# Patient Record
Sex: Female | Born: 1940 | Race: White | Hispanic: No | Smoking: Never smoker
Health system: Southern US, Community
[De-identification: ages and names within clinical notes are randomized; demographics above are authoritative.]

## PROBLEM LIST (undated history)

## (undated) DIAGNOSIS — C4490 Unspecified malignant neoplasm of skin, unspecified: Secondary | ICD-10-CM

## (undated) DIAGNOSIS — K635 Polyp of colon: Secondary | ICD-10-CM

## (undated) DIAGNOSIS — H269 Unspecified cataract: Secondary | ICD-10-CM

## (undated) DIAGNOSIS — Z9289 Personal history of other medical treatment: Secondary | ICD-10-CM

## (undated) DIAGNOSIS — C801 Malignant (primary) neoplasm, unspecified: Secondary | ICD-10-CM

## (undated) DIAGNOSIS — M1990 Unspecified osteoarthritis, unspecified site: Secondary | ICD-10-CM

## (undated) DIAGNOSIS — Z86010 Personal history of colonic polyps: Principal | ICD-10-CM

## (undated) DIAGNOSIS — E785 Hyperlipidemia, unspecified: Secondary | ICD-10-CM

## (undated) DIAGNOSIS — K5790 Diverticulosis of intestine, part unspecified, without perforation or abscess without bleeding: Secondary | ICD-10-CM

## (undated) DIAGNOSIS — K529 Noninfective gastroenteritis and colitis, unspecified: Secondary | ICD-10-CM

## (undated) HISTORY — DX: Polyp of colon: K63.5

## (undated) HISTORY — DX: Unspecified cataract: H26.9

## (undated) HISTORY — DX: Unspecified malignant neoplasm of skin, unspecified: C44.90

## (undated) HISTORY — DX: Personal history of colonic polyps: Z86.010

## (undated) HISTORY — DX: Hyperlipidemia, unspecified: E78.5

## (undated) HISTORY — PX: MOHS SURGERY: SUR867

## (undated) HISTORY — DX: Personal history of other medical treatment: Z92.89

## (undated) HISTORY — PX: TONSILLECTOMY: SUR1361

## (undated) HISTORY — DX: Unspecified osteoarthritis, unspecified site: M19.90

## (undated) HISTORY — DX: Diverticulosis of intestine, part unspecified, without perforation or abscess without bleeding: K57.90

## (undated) HISTORY — PX: COLONOSCOPY: SHX174

## (undated) HISTORY — DX: Noninfective gastroenteritis and colitis, unspecified: K52.9

---
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
## 1997-12-21 ENCOUNTER — Other Ambulatory Visit: Admission: RE | Admit: 1997-12-21 | Discharge: 1997-12-21 | Payer: Self-pay | Admitting: Obstetrics and Gynecology

## 1998-01-04 ENCOUNTER — Other Ambulatory Visit: Admission: RE | Admit: 1998-01-04 | Discharge: 1998-01-04 | Payer: Self-pay | Admitting: Obstetrics and Gynecology

## 1999-01-22 ENCOUNTER — Other Ambulatory Visit: Admission: RE | Admit: 1999-01-22 | Discharge: 1999-01-22 | Payer: Self-pay | Admitting: Obstetrics and Gynecology

## 2000-03-16 ENCOUNTER — Other Ambulatory Visit: Admission: RE | Admit: 2000-03-16 | Discharge: 2000-03-16 | Payer: Self-pay | Admitting: Obstetrics and Gynecology

## 2001-04-05 ENCOUNTER — Other Ambulatory Visit: Admission: RE | Admit: 2001-04-05 | Discharge: 2001-04-05 | Payer: Self-pay | Admitting: Obstetrics and Gynecology

## 2002-04-12 ENCOUNTER — Other Ambulatory Visit: Admission: RE | Admit: 2002-04-12 | Discharge: 2002-04-12 | Payer: Self-pay | Admitting: Obstetrics and Gynecology

## 2002-08-16 DIAGNOSIS — Z860100 Personal history of colon polyps, unspecified: Secondary | ICD-10-CM | POA: Insufficient documentation

## 2002-08-16 HISTORY — DX: Personal history of colonic polyps: Z86.010

## 2002-08-16 HISTORY — DX: Personal history of colon polyps, unspecified: Z86.0100

## 2003-07-17 ENCOUNTER — Other Ambulatory Visit: Admission: RE | Admit: 2003-07-17 | Discharge: 2003-07-17 | Payer: Self-pay | Admitting: Obstetrics and Gynecology

## 2004-07-25 ENCOUNTER — Other Ambulatory Visit: Admission: RE | Admit: 2004-07-25 | Discharge: 2004-07-25 | Payer: Self-pay | Admitting: *Deleted

## 2005-09-12 ENCOUNTER — Other Ambulatory Visit: Admission: RE | Admit: 2005-09-12 | Discharge: 2005-09-12 | Payer: Self-pay | Admitting: Obstetrics & Gynecology

## 2006-04-02 ENCOUNTER — Encounter: Admission: RE | Admit: 2006-04-02 | Discharge: 2006-04-02 | Payer: Self-pay | Admitting: Family Medicine

## 2006-10-23 ENCOUNTER — Other Ambulatory Visit: Admission: RE | Admit: 2006-10-23 | Discharge: 2006-10-23 | Payer: Self-pay | Admitting: Obstetrics & Gynecology

## 2007-06-07 HISTORY — PX: KNEE ARTHROSCOPY: SUR90

## 2007-08-20 IMAGING — CR DG CHEST 2V
2 series · 2 of 2 positions shown · non-contrast
Comparison: none

CLINICAL DATA: Low grade fever, cough. 
 CHEST X-RAY: 
 Two views of the chest show patchy opacity at the left lung base posteriorly and medially most consistent with pneumonia.  No effusion is seen.  The right lung is clear.  The heart is within normal limits in size.

[w chest pa]
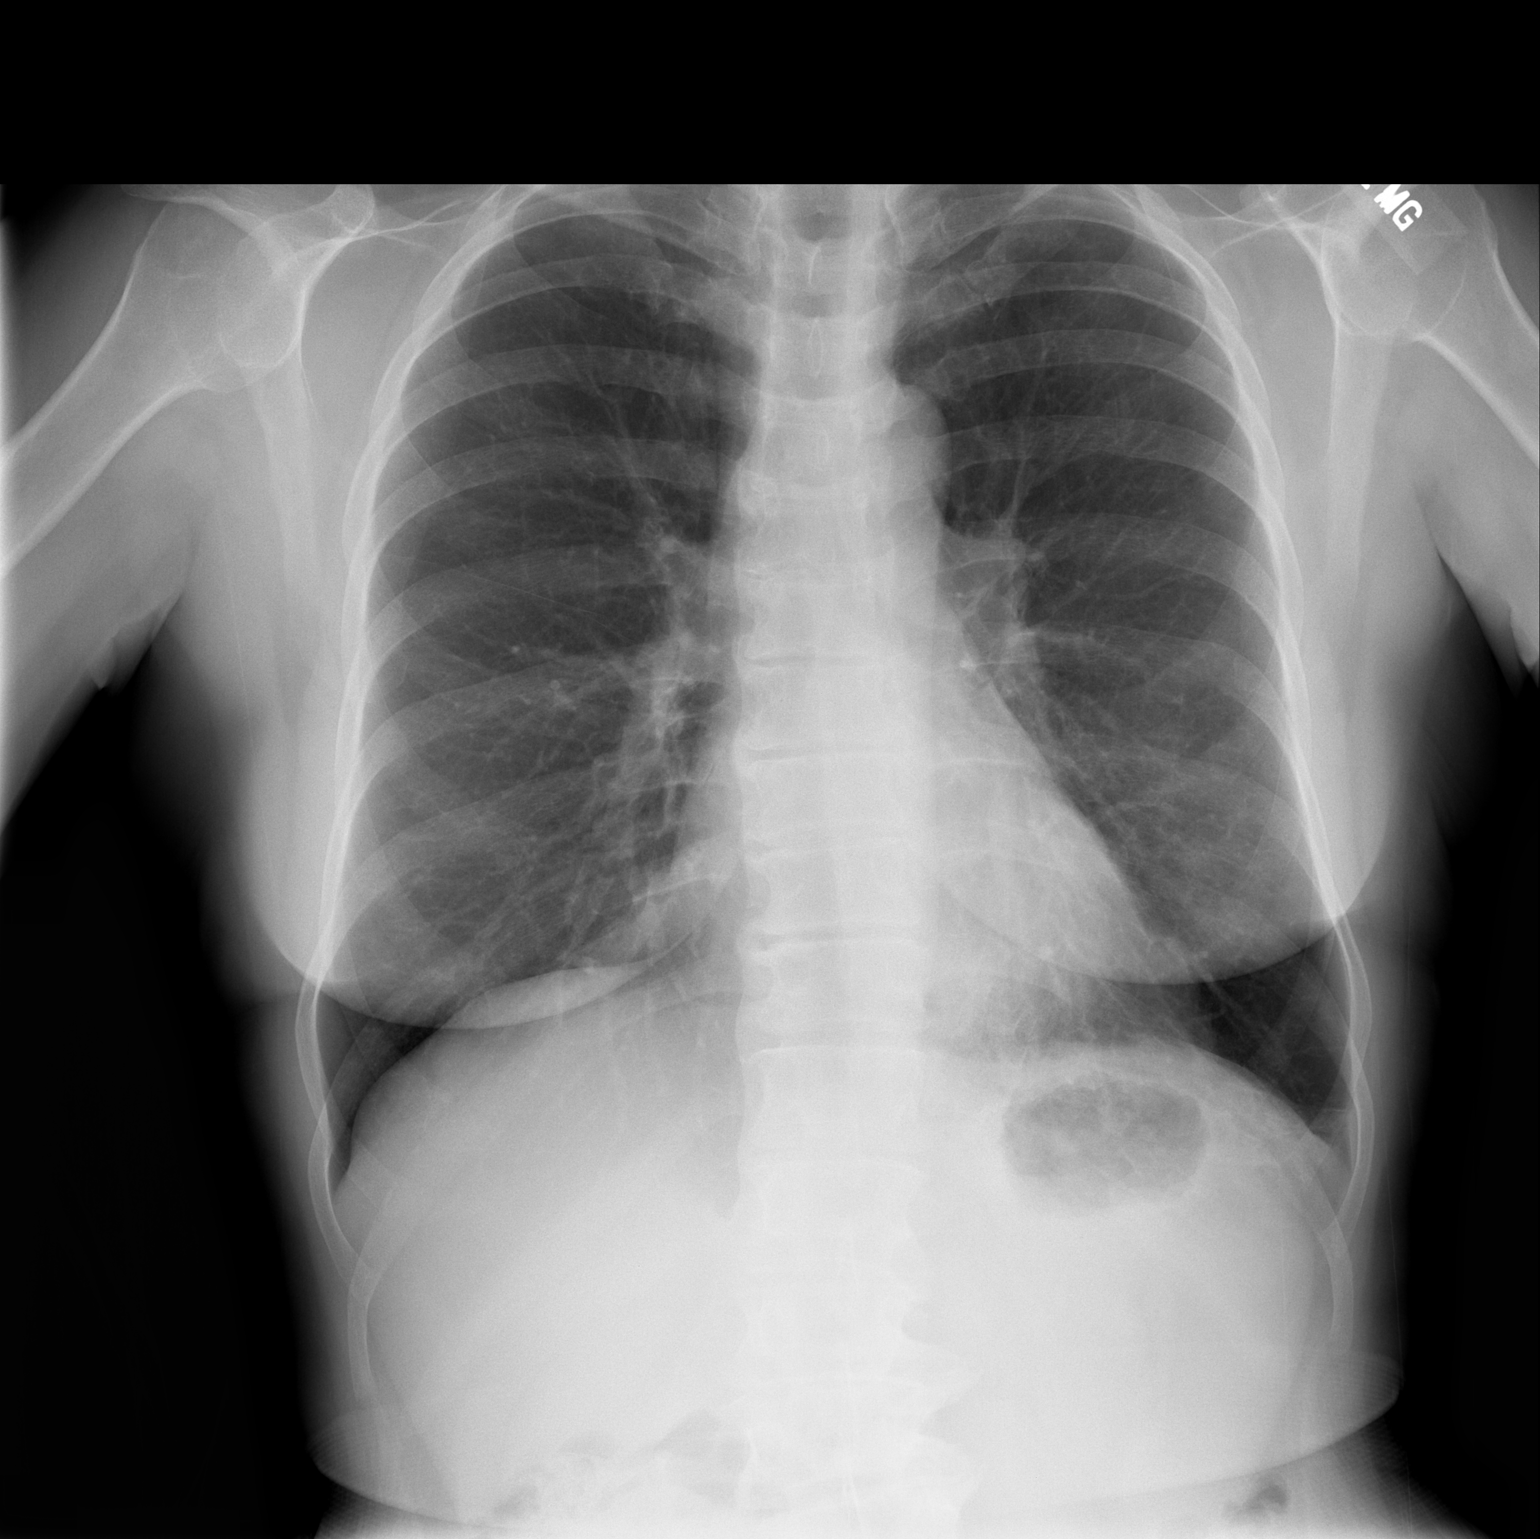

[w chest lat]
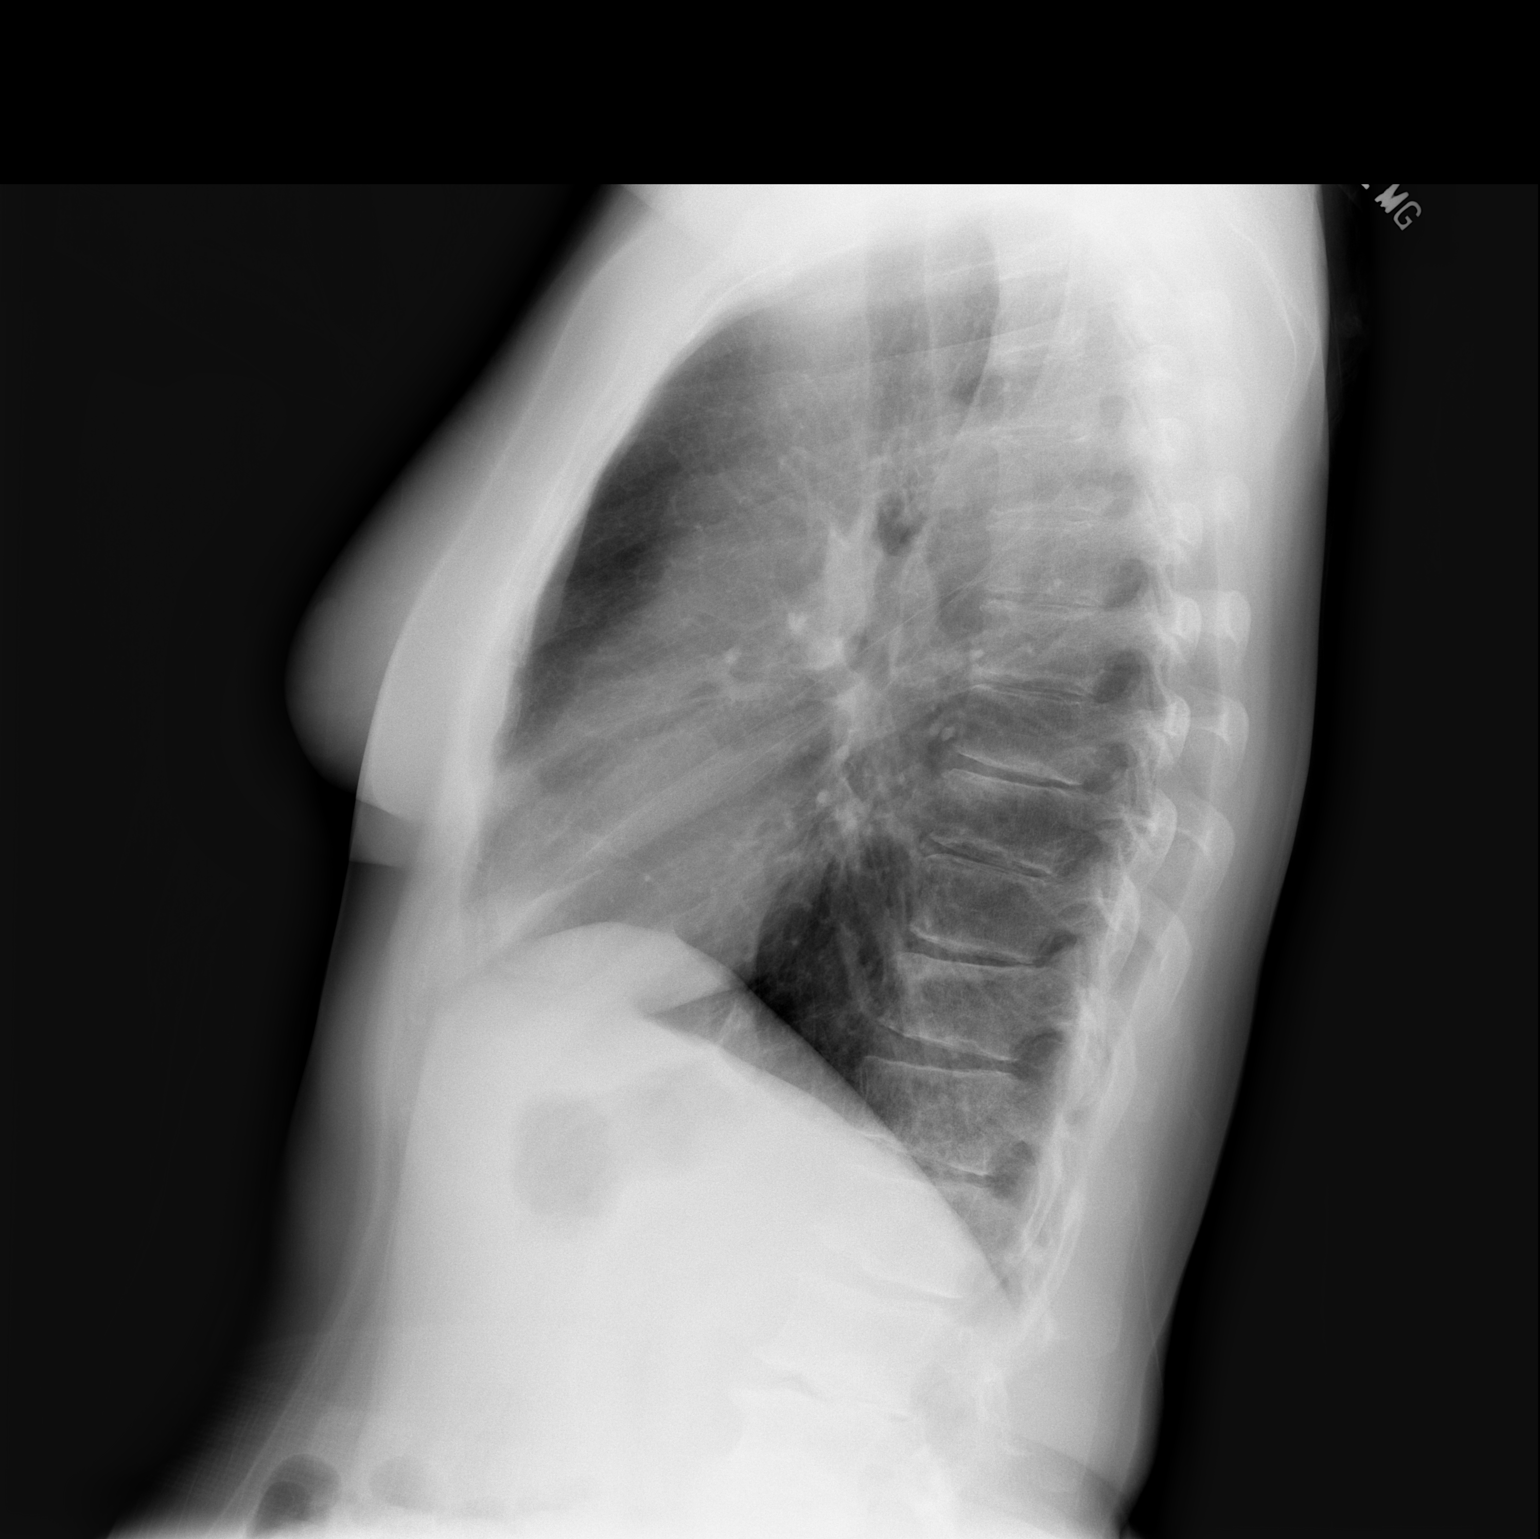

[2 of 2 positions shown; findings below may reference images not displayed]

IMPRESSION: Patchy left lower lobe pneumonia.

## 2007-10-28 ENCOUNTER — Other Ambulatory Visit: Admission: RE | Admit: 2007-10-28 | Discharge: 2007-10-28 | Payer: Self-pay | Admitting: Obstetrics & Gynecology

## 2008-12-05 HISTORY — PX: TOTAL KNEE ARTHROPLASTY: SHX125

## 2009-01-01 ENCOUNTER — Inpatient Hospital Stay (HOSPITAL_COMMUNITY): Admission: RE | Admit: 2009-01-01 | Discharge: 2009-01-03 | Payer: Self-pay | Admitting: Orthopedic Surgery

## 2009-02-13 ENCOUNTER — Encounter (INDEPENDENT_AMBULATORY_CARE_PROVIDER_SITE_OTHER): Payer: Self-pay | Admitting: *Deleted

## 2009-02-27 ENCOUNTER — Ambulatory Visit: Payer: Self-pay | Admitting: Internal Medicine

## 2009-03-14 ENCOUNTER — Telehealth: Payer: Self-pay | Admitting: Internal Medicine

## 2009-03-15 ENCOUNTER — Ambulatory Visit: Payer: Self-pay | Admitting: Internal Medicine

## 2009-03-15 ENCOUNTER — Encounter: Payer: Self-pay | Admitting: Internal Medicine

## 2009-03-18 ENCOUNTER — Encounter: Payer: Self-pay | Admitting: Internal Medicine

## 2010-05-15 IMAGING — CR DG CHEST 2V
2 series · 2 of 2 positions shown · non-contrast
Comparison: 04/02/2006.

CLINICAL DATA: 68-year-old female preoperative study.

CHEST - 2 VIEW

[view not recorded (1 of 2)]
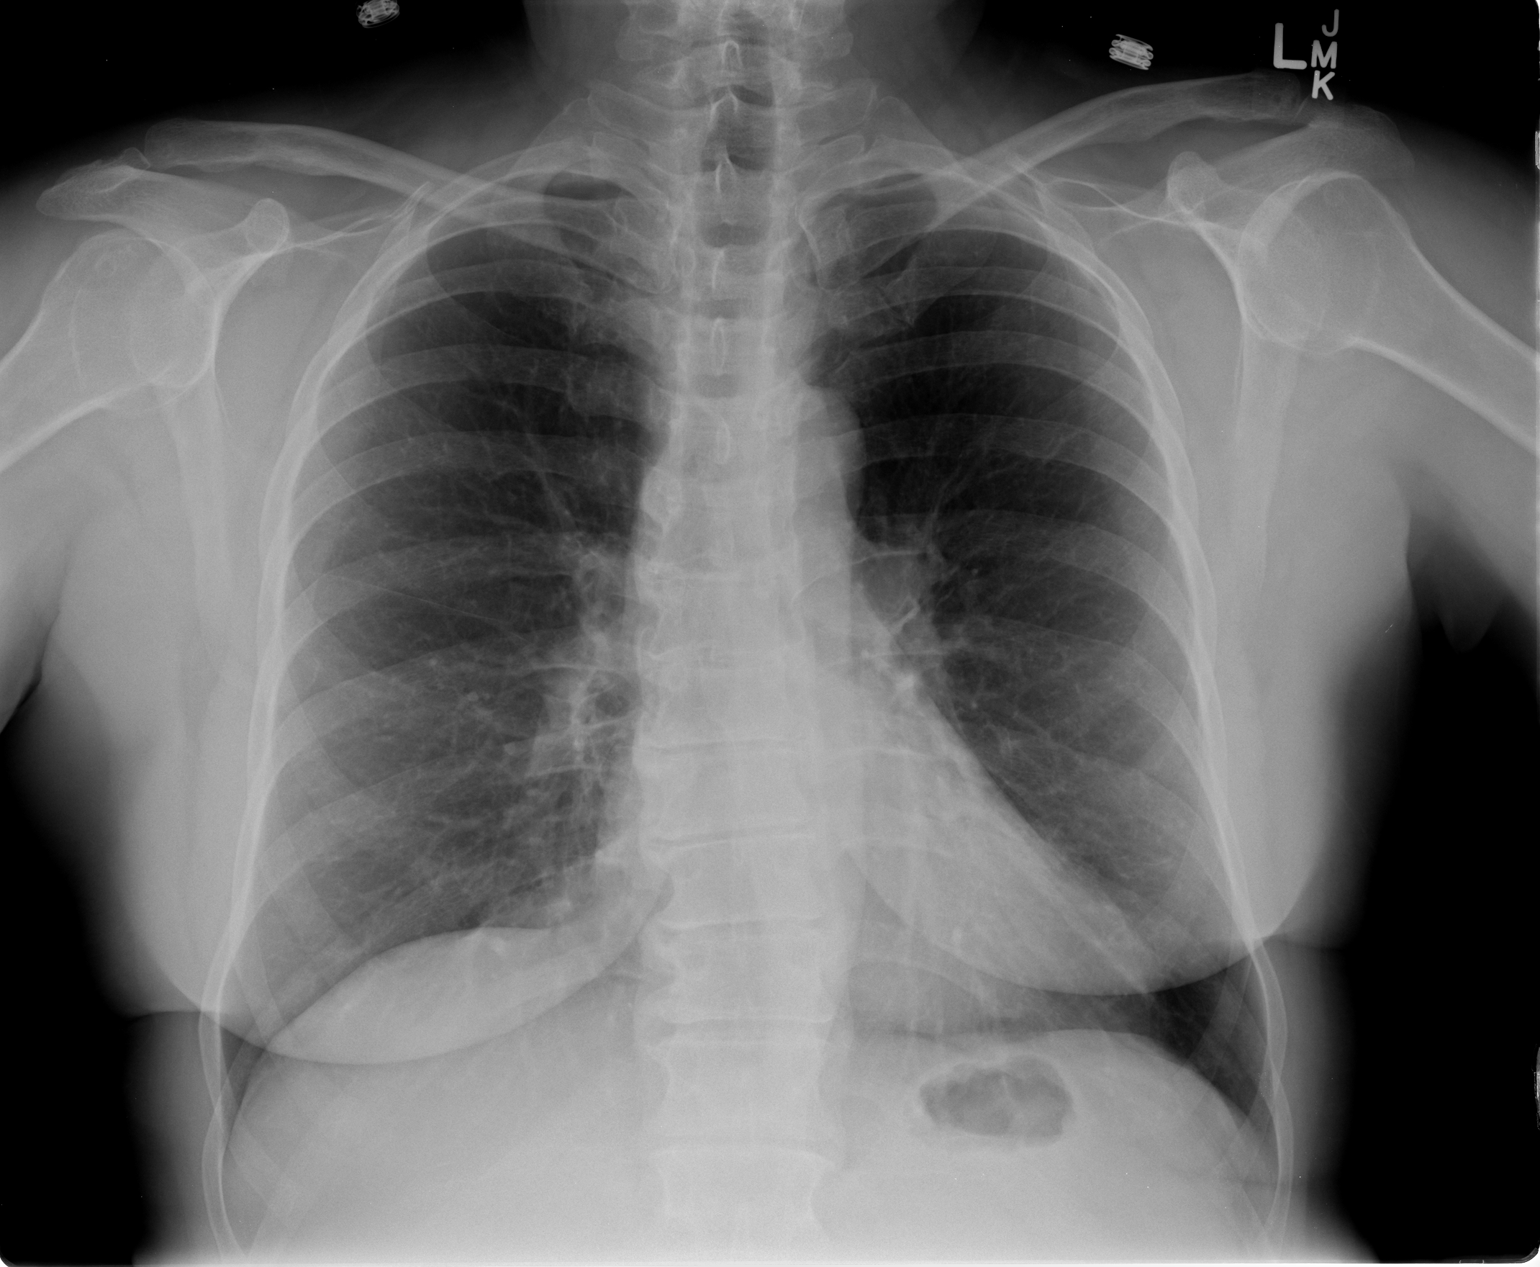

[view not recorded (2 of 2)]
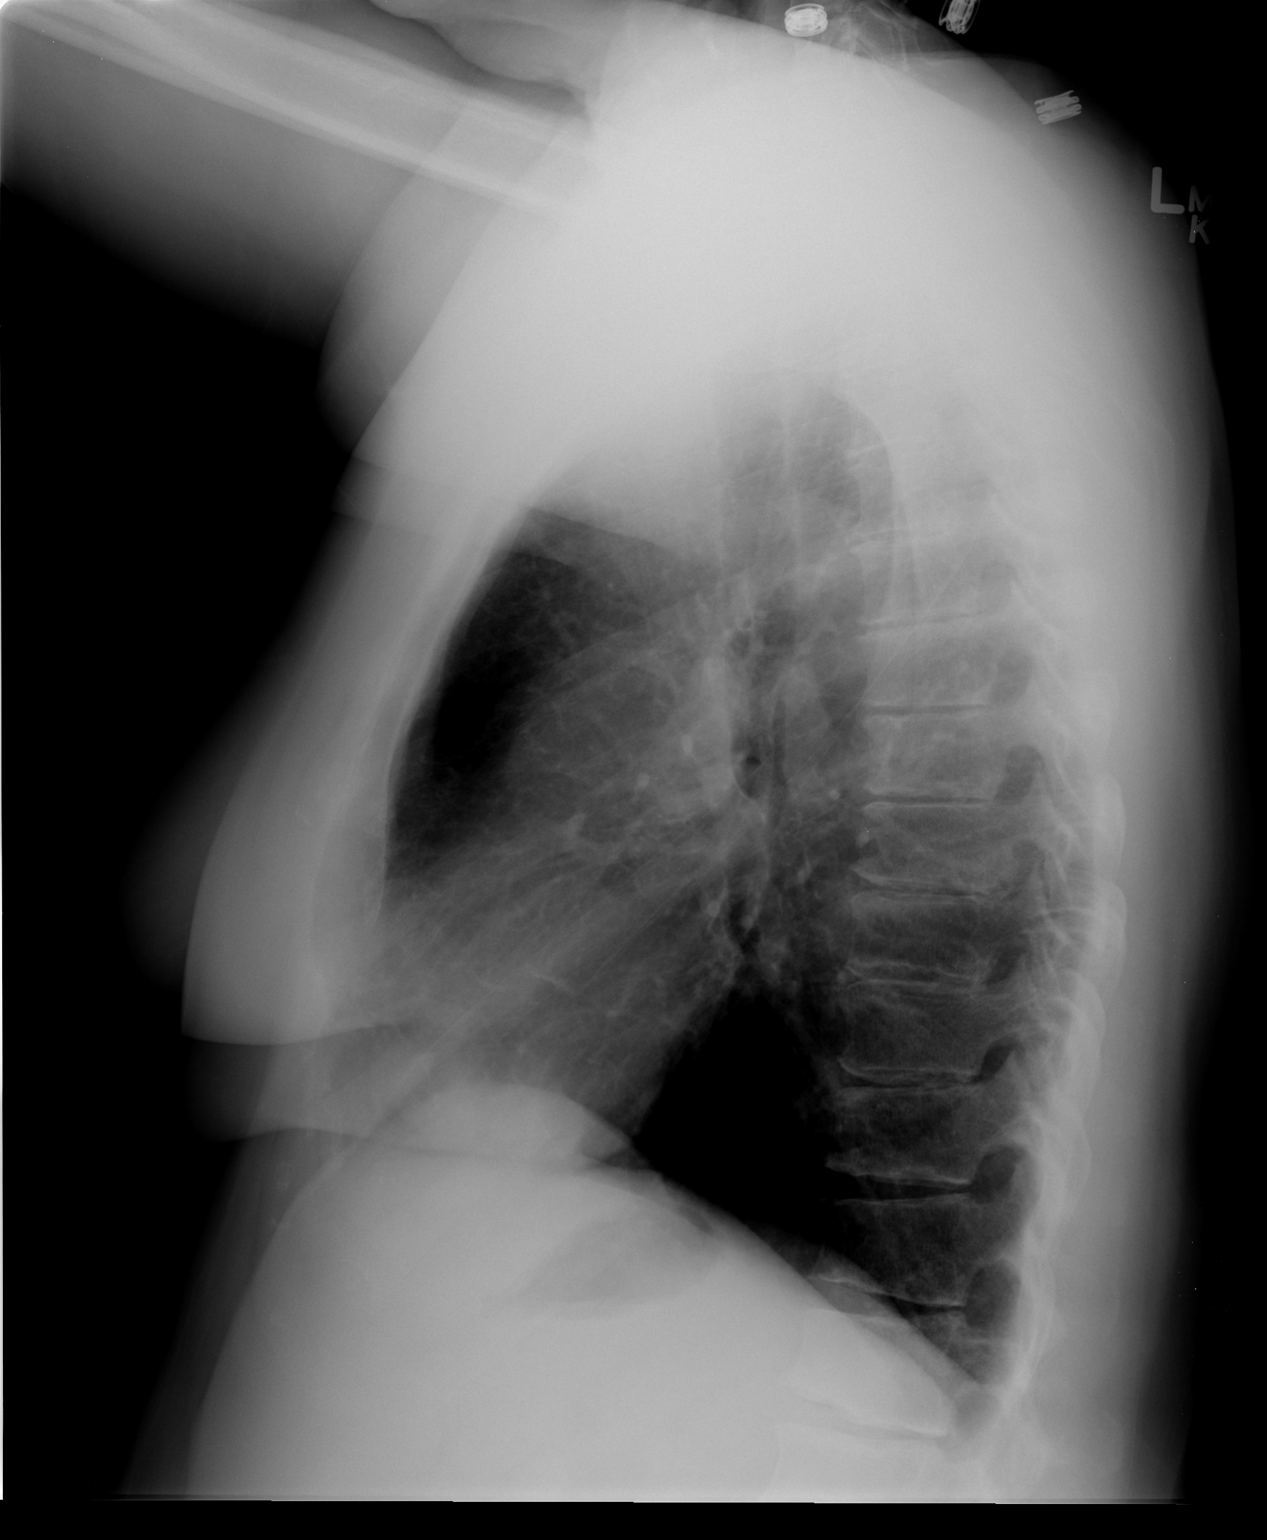

[2 of 2 positions shown; findings below may reference images not displayed]

FINDINGS: Cardiac size and mediastinal contours are within normal
limits.  Visualized tracheal air column is within normal limits.
Large lung volumes again noted.  No pneumothorax, pulmonary edema,
pleural effusion, consolidation, or confluent airspace opacity.
Stable visualized osseous structures.
IMPRESSION: No acute cardiopulmonary abnormality.

## 2010-10-14 LAB — COMPREHENSIVE METABOLIC PANEL
ALT: 38 U/L — ABNORMAL HIGH (ref 0–35)
AST: 32 U/L (ref 0–37)
Albumin: 4.2 g/dL (ref 3.5–5.2)
Calcium: 9.6 mg/dL (ref 8.4–10.5)
Chloride: 103 mEq/L (ref 96–112)
Creatinine, Ser: 0.93 mg/dL (ref 0.4–1.2)
Sodium: 141 mEq/L (ref 135–145)

## 2010-10-14 LAB — APTT: aPTT: 27 seconds (ref 24–37)

## 2010-10-14 LAB — PROTIME-INR
INR: 1 (ref 0.00–1.49)
INR: 1.1 (ref 0.00–1.49)
Prothrombin Time: 13.4 seconds (ref 11.6–15.2)
Prothrombin Time: 19.3 seconds — ABNORMAL HIGH (ref 11.6–15.2)

## 2010-10-14 LAB — URINALYSIS, ROUTINE W REFLEX MICROSCOPIC
Nitrite: NEGATIVE
Protein, ur: NEGATIVE mg/dL
Specific Gravity, Urine: 1.02 (ref 1.005–1.030)
Urobilinogen, UA: 0.2 mg/dL (ref 0.0–1.0)

## 2010-10-14 LAB — DIFFERENTIAL
Lymphs Abs: 2.3 10*3/uL (ref 0.7–4.0)
Monocytes Absolute: 0.5 10*3/uL (ref 0.1–1.0)
Monocytes Relative: 7 % (ref 3–12)
Neutro Abs: 5.1 10*3/uL (ref 1.7–7.7)
Neutrophils Relative %: 63 % (ref 43–77)

## 2010-10-14 LAB — URINE MICROSCOPIC-ADD ON

## 2010-10-14 LAB — CBC
HCT: 38.7 % (ref 36.0–46.0)
Hemoglobin: 10.6 g/dL — ABNORMAL LOW (ref 12.0–15.0)
MCHC: 33.9 g/dL (ref 30.0–36.0)
MCHC: 34.1 g/dL (ref 30.0–36.0)
MCV: 85.9 fL (ref 78.0–100.0)
Platelets: 157 10*3/uL (ref 150–400)
Platelets: 243 10*3/uL (ref 150–400)
RBC: 4.51 MIL/uL (ref 3.87–5.11)
RDW: 13.1 % (ref 11.5–15.5)
RDW: 13.2 % (ref 11.5–15.5)

## 2010-10-14 LAB — BASIC METABOLIC PANEL
BUN: 12 mg/dL (ref 6–23)
CO2: 30 mEq/L (ref 19–32)
Calcium: 9.3 mg/dL (ref 8.4–10.5)
Creatinine, Ser: 0.69 mg/dL (ref 0.4–1.2)
Creatinine, Ser: 0.77 mg/dL (ref 0.4–1.2)
GFR calc non Af Amer: >60 mL/min (ref >60)
Glucose, Bld: 116 mg/dL — ABNORMAL HIGH (ref 70–99)
Glucose, Bld: 134 mg/dL — ABNORMAL HIGH (ref 70–99)
Potassium: 4.5 mEq/L (ref 3.5–5.1)

## 2010-10-14 LAB — URINE CULTURE
Colony Count: NO GROWTH
Culture: NO GROWTH

## 2010-10-14 LAB — TYPE AND SCREEN
ABO/RH(D): A POS
Antibody Screen: NEGATIVE

## 2010-11-19 NOTE — Op Note (Signed)
Angelica Davis, Angelica Davis                ACCOUNT NO.:  [id number]   MEDICAL RECORD NO.:  [id number]          PATIENT TYPE:  INP   LOCATION:  NA                           FACILITY:  [facility]   PHYSICIAN:  Robert A. Thurston Hole, M.D. DATE OF BIRTH:  1940/07/21   DATE OF PROCEDURE:  01/01/2009  DATE OF DISCHARGE:                               OPERATIVE REPORT   PREOPERATIVE DIAGNOSIS:  Left knee degenerative joint disease.   POSTOPERATIVE DIAGNOSIS:  Left knee degenerative joint disease.   PROCEDURE:  Left total knee replacement using DePuy cemented total knee  system with #3 cemented femur, #3 cemented tibia with 12.5-mm  polyethylene RP tibial spacer, and 32-mm polyethylene cemented patella.   SURGEON:  Elana Alm. Thurston Hole, MD   ASSISTANT:  Julien Girt, PA   ANESTHESIA:  General.   OPERATIVE TIME:  1 hour and 25 minutes.   COMPLICATIONS:  None.   DESCRIPTION OF PROCEDURE:  [name] was brought to the operating room  on January 01, 2009, after a femoral nerve block was placed in holding by  Anesthesia.  She was placed on the operative table in supine position.  She received Ancef 2 g IV preoperatively for prophylaxis.  After being  placed under general anesthesia, she had a Foley catheter placed under  sterile conditions.  Her left knee was examined.  Range of motions from  -5 to 125 degrees, moderate varus deformity, knee stable, ligamentous  exam with normal patellar tracking.  Left leg was prepped using sterile  DuraPrep and draped using sterile technique.  The leg was exsanguinated  and a thigh tourniquet elevated to 375 mm.  Initially, through a 15-cm  longitudinal incision based over the patella, initial exposure was made.  The underlying subcutaneous tissues were incised along with skin  incision.  A median arthrotomy was performed revealing an excessive  amount of normal-appearing joint fluid.  The articular surfaces were  inspected.  She had grade 4 changes medially, grade 3  changes laterally,  and grade 3 and 4 changes in the patellofemoral joint.  Osteophytes  removed from the femoral condyles and tibial plateau.  The medial and  lateral meniscal remnants were removed as well as the anterior cruciate  ligament.  An intramedullary drill was then drilled up the femoral canal  for placement of distal femoral cutting jig, which was placed in the  appropriate manner by rotation and a distal 11-mm cut was made.  The  distal femur was then incised.  The #3 was found to be the appropriate  size.  The #3 cutting jig was placed in the appropriate manner by  external rotation and these cuts were made.  The proximal tibia was then  exposed.  The tibial spines were removed with an oscillating saw.  An  intramedullary drill was drilled down the tibial canal for placement of  proximal tibial cutting jig, which was placed in the appropriate manner  by rotation and a proximal 6-mm cut was made based off the medial or  lower side.  Spacer blocks were then placed in flexion and extension.  The 12.5-mm  blocks gave excellent balancing, excellent stability, and  excellent correction of her flexion and varus deformities.  The #3  tibial base plate trial was placed on the cut tibial surface with an  excellent fit and the keel cut was made.  The PCL box cutter was then  placed on the distal femur and these cuts were made.  At this point, the  #3 femoral trial was placed and with the #3 tibial base plate trial and  a 12.5-mm polyethylene RP tibial spacer, knee was reduced, taken through  a full range of motion from 0-125 degrees with excellent stability and  excellent correction of her flexion, varus deformities and normal  patellar tracking.  A resurfacing 9-mm cut was then made on the patella  and 3 locking holes placed for a 32-mm patella trial, which was placed.  Again, patellofemoral tracking was evaluated, and found to be normal.  At this point, it was felt that all the trial  components were of  excellent size, fit, and stability.  They were then removed.  The knee  was then jet lavaged, irrigated with 3 liters of saline.  The proximal  tibia was then exposed.  The #3 tibial base plate with cement backing  was hammered into position with an excellent fit with excess cement  being removed from around the edges.  The #3 femoral component with  cement backing was hammered into position also with an excellent fit  with excess cement being removed from around the edges.  The 12.5-mm  polyethylene RP tibial spacer was placed on the tibial base plate.  The  knee reduced, taken through a range of motion from 0-125 degrees with  excellent stability and excellent correction of her flexion and varus  deformities.  The 32-mm polyethylene cement backed patella was then  placed in its position and held there with a clamp.  After the cement  hardened, again patellofemoral tracking was evaluated and found to be  normal.  At this point, it was felt that all the components were of  excellent size, fit, and stability.  The knee was further irrigated with  saline.  The tourniquet was then released.  Hemostasis obtained with  cautery.  The arthrotomy was then closed with #1 Ethibond suture over 2-  medium Hemovac drains.  Subcutaneous tissues were closed with 0 and 2-0  Vicryl, subcuticular layer closed with 4-0 Monocryl.  Sterile dressings  and a long leg splint applied.  The patient then awakened, extubated,  and taken to recovery room in stable condition.  Needle and sponge  counts were correct x2 at the end of the case.  Neurovascular status  normal.  Pulses 2+ and symmetric.      Robert A. Thurston Hole, M.D.  Electronically Signed     RAW/MEDQ  D:  01/01/2009  T:  01/02/2009  Job:  [id number]

## 2010-11-19 NOTE — Discharge Summary (Signed)
NAMETOSHA, Angelica Davis                ACCOUNT NO.:  [id number]   MEDICAL RECORD NO.:  [id number]          PATIENT TYPE:  INP   LOCATION:  5041                         FACILITY:  [facility]   PHYSICIAN:  Elana Alm. Thurston Hole, M.D. DATE OF BIRTH:  [birth year]   DATE OF ADMISSION:  01/01/2009  DATE OF DISCHARGE:  01/03/2009                               DISCHARGE SUMMARY   ADMITTING DIAGNOSIS:  1. End-stage degenerative joint disease left knee.  2. High cholesterol.   DISCHARGE DIAGNOSIS:  1. End stage degenerative joint disease left knee status post total      knee replacement.  2. High cholesterol.  3. Postoperative  blood loss anemia.  4. Fever.   HISTORY OF PRESENT ILLNESS:  The patient is a 70 year old white female  with a history of end-stage DJD of her left knee.  She failed  conservative care including interarticular cortisone injections anti-  inflammatories and debriding arthroscopy.  Risks, benefits and possible  complications of the left total knee replacement were discussed with the  patient.  She understands these and is without question.   PROCEDURES IN-HOUSE:  On January 01, 2009 the patient underwent a left  total knee replacement by Dr. Thurston Hole, a left femoral nerve block by  anesthesia and Auto-vac transfusion.  She tolerated all of these  procedures well.   HOSPITAL COURSE:  She was admitted postoperatively for pain control, DVT  prophylaxis and physical therapy.  In the PACU she only tolerated the  CPM zero to 30 degrees due to pain.  As her pain improved, her CPM was  bumped up.  On postop day #1 she was metabolically stable with normal  renal function and her hemoglobin of 10.6.  Her INR was subtherapeutic  at 1.1.  She did well with physical therapy and was very compliant.  She  ambulated 40 feet minimal guard assist with the assistance of physical  therapist.  Over postop day two, she had a T-max of 100.9, hemoglobin  was 10.5.  White cell count was 8.3.  INR was 1.6.   Renal function was  within normal limits.  Blood pressure is 121/66, pulse was 108.  Surgical wound is well-approximated and healing with minimal drainage.  She tolerated  elevating her left heel on a folded pillow 30 minutes  each day.  She tolerated her CPM zero to 60 degrees today.  She will  need to increase it by 10 degrees a day until she is at 90 degrees and  then will need to do 6 hours of her CPM at zero to 90 degrees daily.  She will need to elevate her left heel on a folded pillow for 30 minutes  daily to work on extension.  She will need daily physical therapy for  ambulation, range of motion and strengthening.  She will need OT for  ADLs.   She can bathe on January 08, 2009, getting her knee wet.  Until then she  needs a clean dry dressing placed daily.  She is on a regular diet.  She  is to call our office with increased pain, increased  redness about the  wound, increased swelling, increased drainage or temperature greater  than 101.  She is encouraged incentive spirometry.  She will follow up  in our office on January 15, 2009 at 2:00 p.m. for x-rays and a wound  check.  Our office number is [phone].  She is being discharged to  skilled nursing in stable condition, weightbearing as tolerated on a  regular diet for physical therapy, occupational therapy and skilled  nursing to manage her Coumadin.   DISCHARGE MEDICATIONS:  1. Lovenox 30 mg subcu b.i.d. till INR is 2.0 or higher.  2. Coumadin 4 mg 1 tablet daily, adjust the dose accordingly to      maintain her INR between 2.0  and 3.0.  She will be on Coumadin      until January 29, 2009; at that time it can be discontinued.  3. She is on Colace 100 mg 1 tablet once a day.  4. Calcium carbonate 500 mg 1 tablet twice a day.  5. Vitamin D 3 at 2000 units 1 tablet daily and then 400 units 1      tablet daily.  6. Robaxin 500 mg 1 tablet p.o. q.6 h. p.r.n. muscle spasm.  7. Percocet 5/ 325 one to two q.4 h p.r.n. pain.  8. Zofran 4  mg one p.o. q.6 h. p.r.n. nausea.      Kirstin Shepperson, P.A.      Robert A. Thurston Hole, M.D.  Electronically Signed    KS/MEDQ  D:  01/03/2009  T:  01/03/2009  Job:  [id number]

## 2013-05-16 ENCOUNTER — Encounter: Payer: Self-pay | Admitting: Obstetrics & Gynecology

## 2013-05-16 ENCOUNTER — Ambulatory Visit (INDEPENDENT_AMBULATORY_CARE_PROVIDER_SITE_OTHER): Payer: Medicare Other | Admitting: Obstetrics & Gynecology

## 2013-05-16 VITALS — BP 158/84 | HR 68 | Resp 20 | Ht 62.75 in | Wt 212.0 lb

## 2013-05-16 DIAGNOSIS — E785 Hyperlipidemia, unspecified: Secondary | ICD-10-CM

## 2013-05-16 DIAGNOSIS — Z01419 Encounter for gynecological examination (general) (routine) without abnormal findings: Secondary | ICD-10-CM

## 2013-05-16 DIAGNOSIS — Z0000 Encounter for general adult medical examination without abnormal findings: Secondary | ICD-10-CM

## 2013-05-16 LAB — POCT URINALYSIS DIPSTICK
Blood, UA: NEGATIVE
Protein, UA: NEGATIVE
Urobilinogen, UA: NEGATIVE

## 2013-05-16 NOTE — Patient Instructions (Signed)

## 2013-05-16 NOTE — Progress Notes (Signed)
72 y.o. G3P3 DivorcedCaucasianF here for annual exam.  Doing well.  Father died in 12-02-2022.  Sister in law has bone cancer.  Having bone marrow transplant in 12-02-22.  Saw Dr. Manus Gunning in August.  Reports cholesterol was a little elevated.  She is back on some supplements.    Has mixed urinary incontinence.  Did see physical therapist.  Some of the exercises and dietary changes really helped.  Not interested in surgery right now.    No vaginal bleeding.    Patient's last menstrual period was 07/07/2004.          Sexually active: no  The current method of family planning is none.    Exercising: no  not regularly Smoker:  no  Health Maintenance: Pap:  05/13/12 WNL History of abnormal Pap:  no MMG:  08/19/11 normal, 05/18/12 left diag-normal-due  Colonoscopy:  9/10 repeat in 5 years, Dr. Leone Payor BMD:   7/12 (1.6/-0.7) TDaP:  6/10 Screening Labs: PCP, Hb today: PCP, Urine today: negative   reports that she has never smoked. She has never used smokeless tobacco. She reports that she does not drink alcohol or use illicit drugs.  Past Medical History  Diagnosis Date  . Colitis   . Colon polyp   . Diverticulosis   . Transfusion history     blood transfusion after childbirth    Past Surgical History  Procedure Laterality Date  . Knee surgery      left  . Total knee arthroplasty      left  . Colonoscopy      polyps removed    Current Outpatient Prescriptions  Medication Sig Dispense Refill  . Calcium Carb-Cholecalciferol (CALCIUM 1000 + D PO) Take 1,000 mg by mouth daily.      . Multiple Vitamins-Minerals (ICAPS) CAPS Take by mouth.      . NON FORMULARY phytomega   Daily for cholesterol       No current facility-administered medications for this visit.    Family History  Problem Relation Age of Onset  . Breast cancer Paternal Aunt   . Other Father     rectal polyps  . Colon cancer Mother   . COPD Father     ROS:  Pertinent items are noted in HPI.  Otherwise, a comprehensive ROS  was negative.  Exam:   BP 158/84  Pulse 68  Resp 20  Ht 5' 2.75" (1.594 m)  Wt 212 lb (96.163 kg)  BMI 37.85 kg/m2  LMP 07/07/2004  Weight change: -4lbs   Height: 5' 2.75" (159.4 cm)  Ht Readings from Last 3 Encounters:  05/16/13 5' 2.75" (1.594 m)    General appearance: alert, cooperative and appears stated age Head: Normocephalic, without obvious abnormality, atraumatic Neck: no adenopathy, supple, symmetrical, trachea midline and thyroid normal to inspection and palpation Lungs: clear to auscultation bilaterally Breasts: normal appearance, no masses or tenderness Heart: regular rate and rhythm Abdomen: soft, non-tender; bowel sounds normal; no masses,  no organomegaly Extremities: extremities normal, atraumatic, no cyanosis or edema Skin: Skin color, texture, turgor normal. No rashes or lesions Lymph nodes: Cervical, supraclavicular, and axillary nodes normal. No abnormal inguinal nodes palpated Neurologic: Grossly normal   Pelvic: External genitalia:  no lesions              Urethra:  normal appearing urethra with no masses, tenderness or lesions              Bartholins and Skenes: normal  Vagina: normal appearing vagina with normal color and discharge, no lesions              Cervix: no lesions              Pap taken: no Bimanual Exam:  Uterus:  normal size, contour, position, consistency, mobility, non-tender              Adnexa: normal adnexa and no mass, fullness, tenderness               Rectovaginal: Confirms               Anus:  normal sphincter tone, no lesions  A:  Well Woman with normal exam PMP, no HRT Elevated lipids, elevated LDLs Mixed urinary incontinence  P:   Mammogram yearly.  Pt knows due.  She will schedule. pap smear every other year per pt request. Labs/physical exam with Dr. Manus Gunning TSH today return annually or prn  An After Visit Summary was printed and given to the patient.

## 2013-08-11 ENCOUNTER — Ambulatory Visit: Payer: Self-pay | Admitting: Obstetrics & Gynecology

## 2014-03-06 ENCOUNTER — Encounter: Payer: Self-pay | Admitting: Internal Medicine

## 2014-04-28 ENCOUNTER — Ambulatory Visit (AMBULATORY_SURGERY_CENTER): Payer: Medicare Other | Admitting: *Deleted

## 2014-04-28 VITALS — Ht 63.0 in | Wt 222.4 lb

## 2014-04-28 DIAGNOSIS — Z86010 Personal history of colonic polyps: Secondary | ICD-10-CM

## 2014-04-28 NOTE — Progress Notes (Signed)
No allergies to eggs or soy. No problems with anesthesia.  Pt given Emmi instructions for colonoscopy  No oxygen use  No diet drug use  

## 2014-05-04 ENCOUNTER — Encounter: Payer: Self-pay | Admitting: Internal Medicine

## 2014-05-12 ENCOUNTER — Ambulatory Visit (AMBULATORY_SURGERY_CENTER): Payer: Medicare Other | Admitting: Internal Medicine

## 2014-05-12 ENCOUNTER — Encounter: Payer: Self-pay | Admitting: Internal Medicine

## 2014-05-12 VITALS — BP 129/77 | HR 71 | Temp 98.2°F | Resp 24 | Ht 63.0 in | Wt 222.0 lb

## 2014-05-12 DIAGNOSIS — D123 Benign neoplasm of transverse colon: Secondary | ICD-10-CM

## 2014-05-12 DIAGNOSIS — Z86010 Personal history of colonic polyps: Secondary | ICD-10-CM

## 2014-05-12 MED ORDER — SODIUM CHLORIDE 0.9 % IV SOLN: 500.0000 mL | INTRAVENOUS | Status: DC

## 2014-05-12 NOTE — Progress Notes (Signed)
Called to room to assist during endoscopic procedure.  Patient ID and intended procedure confirmed with present staff. Received instructions for my participation in the procedure from the performing physician.  

## 2014-05-12 NOTE — Op Note (Signed)
[place]  [address]. [place], [address]   COLONOSCOPY PROCEDURE REPORT  PATIENT: Angelica Davis, Angelica Davis  MR#: [id number] BIRTHDATE: [birth year] , 73  yrs. old GENDER: female ENDOSCOPIST: Gatha Mayer, MD, [facility] PROCEDURE DATE:  05/12/2014 PROCEDURE:   Colonoscopy with snare polypectomy First Screening Colonoscopy - Avg.  risk and is 50 yrs.  old or older - No.  Prior Negative Screening - Now for repeat screening. N/A  History of Adenoma - Now for follow-up colonoscopy & has been > or = to 3 yrs.  Yes hx of adenoma.  Has been 3 or more years since last colonoscopy.  Polyps Removed Today? Yes. ASA CLASS:   Class II INDICATIONS:surveillance colonoscopy based on a history of adenomatous colonic polyp(s). MEDICATIONS: Propofol 300 mg IV and Monitored anesthesia care  DESCRIPTION OF PROCEDURE:   After the risks benefits and alternatives of the procedure were thoroughly explained, informed consent was obtained.  The digital rectal exam revealed no abnormalities of the rectum.   The LB [id number]  endoscope was introduced through the anus and advanced to the cecum, which was identified by both the appendix and ileocecal valve. No adverse events experienced.   The quality of the prep was excellent, using MiraLax  The instrument was then slowly withdrawn as the colon was fully examined.   COLON FINDINGS: A sessile polyp measuring 4 mm in size was found in the transverse colon.  A polypectomy was performed with a cold snare.  The resection was complete, the polyp tissue was completely retrieved and sent to histology.   There was diverticulosis noted in the sigmoid colon.   The examination was otherwise normal. Retroflexed views right colon and rectum revealed no abnormalities. The time to cecum=3 minutes 05 seconds.  Withdrawal time=8 minutes 23 seconds.  The scope was withdrawn and the procedure completed. COMPLICATIONS: There were no immediate  complications.  ENDOSCOPIC IMPRESSION: 1.   Sessile polyp measuring 4 mm in size was found in the transverse colon; polypectomy was performed with a cold snare 2.   Diverticulosis was noted in the sigmoid colon 3.   The examination was otherwise normal in patient with prior adenomas 2005, 2010 and FHx CRCA elderly mom, polyps in dad  RECOMMENDATIONS: Repeat Colonoscopy in 5 years.  eSigned:  Gatha Mayer, MD, [facility] 05/12/2014 8:30 AM   cc: The Patient and Gaynelle Arabian, MD

## 2014-05-12 NOTE — Patient Instructions (Addendum)
I found and removed one small polyp. You still have diverticulosis. I will let you know pathology results and when to have another routine colonoscopy by mail. Suspect 2020.  I appreciate the opportunity to care for you. Gatha Mayer, MD, FACG  YOU HAD AN ENDOSCOPIC PROCEDURE TODAY AT [facility]: Refer to the procedure report that was given to you for any specific questions about what was found during the examination.  If the procedure report does not answer your questions, please call your gastroenterologist to clarify.  If you requested that your care partner not be given the details of your procedure findings, then the procedure report has been included in a sealed envelope for you to review at your convenience later.  YOU SHOULD EXPECT: Some feelings of bloating in the abdomen. Passage of more gas than usual.  Walking can help get rid of the air that was put into your GI tract during the procedure and reduce the bloating. If you had a lower endoscopy (such as a colonoscopy or flexible sigmoidoscopy) you may notice spotting of blood in your stool or on the toilet paper. If you underwent a bowel prep for your procedure, then you may not have a normal bowel movement for a few days.  DIET: Your first meal following the procedure should be a light meal and then it is ok to progress to your normal diet.  A half-sandwich or bowl of soup is an example of a good first meal.  Heavy or fried foods are harder to digest and may make you feel nauseous or bloated.  Likewise meals heavy in dairy and vegetables can cause extra gas to form and this can also increase the bloating.  Drink plenty of fluids but you should avoid alcoholic beverages for 24 hours.  ACTIVITY: Your care partner should take you home directly after the procedure.  You should plan to take it easy, moving slowly for the rest of the day.  You can resume normal activity the day after the procedure however you should  NOT DRIVE or use heavy machinery for 24 hours (because of the sedation medicines used during the test).    SYMPTOMS TO REPORT IMMEDIATELY: A gastroenterologist can be reached at any hour.  During normal business hours, 8:30 AM to 5:00 PM Monday through Friday, call [phone].  After hours and on weekends, please call the GI answering service at [phone] who will take a message and have the physician on call contact you.   Following lower endoscopy (colonoscopy or flexible sigmoidoscopy):  Excessive amounts of blood in the stool  Significant tenderness or worsening of abdominal pains  Swelling of the abdomen that is new, acute  Fever of 100F or higher  FOLLOW UP: If any biopsies were taken you will be contacted by phone or by letter within the next 1-3 weeks.  Call your gastroenterologist if you have not heard about the biopsies in 3 weeks.  Our staff will call the home number listed on your records the next business day following your procedure to check on you and address any questions or concerns that you may have at that time regarding the information given to you following your procedure. This is a courtesy call and so if there is no answer at the home number and we have not heard from you through the emergency physician on call, we will assume that you have returned to your regular daily activities without incident.  SIGNATURES/CONFIDENTIALITY: You and/or your  care partner have signed paperwork which will be entered into your electronic medical record.  These signatures attest to the fact that that the information above on your After Visit Summary has been reviewed and is understood.  Full responsibility of the confidentiality of this discharge information lies with you and/or your care-partner.  Recommendations Next colonoscopy in 5 years. Diverticulosis and polyp handout provided.

## 2014-05-12 NOTE — Progress Notes (Signed)
Procedure ends, to recovery, report given and VSS. 

## 2014-05-15 ENCOUNTER — Telehealth: Payer: Self-pay | Admitting: *Deleted

## 2014-05-15 NOTE — Telephone Encounter (Signed)
  Follow up Call-  Call back number 05/12/2014  Post procedure Call Back phone  # [phone]  Permission to leave phone message Yes     Patient questions:  Do you have a fever, pain , or abdominal swelling? No. Pain Score  0 *  Have you tolerated food without any problems? Yes.    Have you been able to return to your normal activities? Yes.    Do you have any questions about your discharge instructions: Diet   No. Medications  No. Follow up visit  No.  Do you have questions or concerns about your Care? No.  Actions: * If pain score is 4 or above: No action needed, pain <4.

## 2014-05-18 ENCOUNTER — Encounter: Payer: Self-pay | Admitting: Internal Medicine

## 2014-05-18 DIAGNOSIS — Z86010 Personal history of colonic polyps: Secondary | ICD-10-CM

## 2014-05-18 NOTE — Progress Notes (Signed)
Quick Note:  Diminutive adenoma  Repeat colon 2020 ______

## 2014-06-05 ENCOUNTER — Ambulatory Visit: Payer: Medicare Other | Admitting: Obstetrics & Gynecology

## 2014-06-26 ENCOUNTER — Other Ambulatory Visit: Payer: Self-pay | Admitting: Dermatology

## 2014-07-04 ENCOUNTER — Encounter: Payer: Self-pay | Admitting: Obstetrics & Gynecology

## 2014-07-05 ENCOUNTER — Ambulatory Visit (INDEPENDENT_AMBULATORY_CARE_PROVIDER_SITE_OTHER): Payer: Medicare Other | Admitting: Obstetrics & Gynecology

## 2014-07-05 ENCOUNTER — Encounter: Payer: Self-pay | Admitting: Obstetrics & Gynecology

## 2014-07-05 VITALS — BP 156/80 | HR 90 | Resp 18 | Ht 62.75 in | Wt 223.0 lb

## 2014-07-05 DIAGNOSIS — Z01419 Encounter for gynecological examination (general) (routine) without abnormal findings: Secondary | ICD-10-CM

## 2014-07-05 DIAGNOSIS — Z0000 Encounter for general adult medical examination without abnormal findings: Secondary | ICD-10-CM

## 2014-07-05 DIAGNOSIS — Z124 Encounter for screening for malignant neoplasm of cervix: Secondary | ICD-10-CM

## 2014-07-05 LAB — POCT URINALYSIS DIPSTICK
PH UA: 5
UROBILINOGEN UA: NEGATIVE

## 2014-07-05 NOTE — Progress Notes (Signed)
73 y.o. G3P3 DivorcedCaucasianF here for annual exam.  Doing well.  Colonoscopy was in November.  No vaginal bleeding.  Having some increased issues with swelling in left lower extremities.  Has gained some weight this past year.  Swelling will go down if she elevated her foot.  Non tender.  PCP:  Dr. Marisue Humble.  Had labs in the summer.  Cholesterol was a little elevated.    Patient's last menstrual period was 07/07/2004.          Sexually active: Yes.    The current method of family planning is post menopausal status.    Exercising: No.  The patient does not participate in regular exercise at present. Smoker:  no  Health Maintenance: Pap:  05/23/12 Negative  History of abnormal Pap:  no MMG:  01/11/14 Bi-Rads 1: Negative Colonoscopy:  05/12/14 1 polyp- f/u in 2020 BMD:   7/12 1.6/-0.7 TDaP:  12/2008 Screening Labs: yes, Hb today: Done by Gaynelle Cage, MD Urine today: Trace WBC's   reports that she has never smoked. She has never used smokeless tobacco. She reports that she does not drink alcohol or use illicit drugs.  Past Medical History  Diagnosis Date  . Colitis   . Colon polyp   . Diverticulosis   . Transfusion history     blood transfusion after childbirth  . Arthritis   . Hyperlipidemia   . Hx of colonic polyps - adenomas 08/16/2002  . History of blood transfusion     after birth of child    Past Surgical History  Procedure Laterality Date  . Knee arthroscopy Left 12/08    Dr. Noemi Chapel  . 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 Noemi Chapel  . Colonoscopy      polyps removed    Current Outpatient Prescriptions  Medication Sig Dispense Refill  . Calcium Carb-Cholecalciferol (CALCIUM 1000 + D PO) Take 1,000 mg by mouth daily.    . metroNIDAZOLE (METROGEL) 0.75 % gel Apply 1 application topically 2 (two) times daily.    . cephALEXin (KEFLEX) 500 MG capsule Take 500 mg by mouth 4 (four) times daily.    . Multiple Vitamins-Minerals (ICAPS) CAPS Take by mouth.    . NON  FORMULARY phytomega   Daily for cholesterol     No current facility-administered medications for this visit.    Family History  Problem Relation Age of Onset  . Breast cancer Paternal Aunt   . Other Father     rectal polyps  . COPD Father   . Colon cancer Mother 67    ROS:  Pertinent items are noted in HPI.  Otherwise, a comprehensive ROS was negative.  Exam:   BP 156/80 mmHg  Pulse 90  Resp 18  Ht 5' 2.75" (1.594 m)  Wt 223 lb (101.152 kg)  BMI 39.81 kg/m2  LMP 07/07/2004  Weight change: +9#  Height: 5' 2.75" (159.4 cm)  Ht Readings from Last 3 Encounters:  07/05/14 5' 2.75" (1.594 m)  05/12/14 5\' 3"  (1.6 m)  04/28/14 5\' 3"  (1.6 m)    General appearance: alert, cooperative and appears stated age Head: Normocephalic, without obvious abnormality, atraumatic Neck: no adenopathy, supple, symmetrical, trachea midline and thyroid normal to inspection and palpation Lungs: clear to auscultation bilaterally Breasts: normal appearance, no masses or tenderness Heart: regular rate and rhythm Abdomen: soft, non-tender; bowel sounds normal; no masses,  no organomegaly Extremities: extremities normal, atraumatic, no cyanosis or edema Skin: Skin color, texture, turgor normal. No rashes or  lesions Lymph nodes: Cervical, supraclavicular, and axillary nodes normal. No abnormal inguinal nodes palpated Neurologic: Grossly normal   Pelvic: External genitalia:  no lesions              Urethra:  normal appearing urethra with no masses, tenderness or lesions              Bartholins and Skenes: normal                 Vagina: normal appearing vagina with normal color and discharge, no lesions              Cervix: no lesions              Pap taken: Yes.   Bimanual Exam:  Uterus:  normal size, contour, position, consistency, mobility, non-tender              Adnexa: normal adnexa and no mass, fullness, tenderness               Rectovaginal: Confirms               Anus:  normal sphincter  tone, no lesions  Chaperone was present for exam.  A:  Well Woman with normal exam PMP, no HRT Elevated lipids, elevated LDLs being followed by Dr. Marisue Humble Mixed urinary incontinence Mild RLE edema  P: Mammogram yearly. pap smear every other year per pt request.  Pap today. Labs/physical exam with Dr. Marisue Humble D/W pt weight loss.  Pt states she is just not motivated enough for that right now. return annually or prn

## 2014-07-06 ENCOUNTER — Ambulatory Visit: Payer: Medicare Other | Admitting: Obstetrics & Gynecology

## 2014-07-10 LAB — IPS PAP SMEAR ONLY

## 2015-05-09 ENCOUNTER — Encounter: Payer: Self-pay | Admitting: Internal Medicine

## 2015-09-04 ENCOUNTER — Encounter: Payer: Self-pay | Admitting: Obstetrics & Gynecology

## 2015-09-04 ENCOUNTER — Ambulatory Visit (INDEPENDENT_AMBULATORY_CARE_PROVIDER_SITE_OTHER): Payer: Medicare Other | Admitting: Obstetrics & Gynecology

## 2015-09-04 VITALS — BP 142/80 | HR 74 | Resp 16 | Ht 62.5 in | Wt 188.0 lb

## 2015-09-04 DIAGNOSIS — Z01419 Encounter for gynecological examination (general) (routine) without abnormal findings: Secondary | ICD-10-CM | POA: Diagnosis not present

## 2015-09-04 DIAGNOSIS — E785 Hyperlipidemia, unspecified: Secondary | ICD-10-CM | POA: Insufficient documentation

## 2015-09-04 NOTE — Progress Notes (Addendum)
[name] here for annual exam.  Doing well.  Denies vaginal bleeding.    Actively doing weight watchers and has lost 35#'s.    PCP:  Angelica Davis.  Last appt was in October and did blood work then.  Reports cholesterol was 200.  Patient's last menstrual period was 07/07/2004.          Sexually active: No.  The current method of family planning is post menopausal status.    Exercising: No.  The patient does not participate in regular exercise at present. Smoker:  no  Health Maintenance: Pap:  07/05/14 Neg  History of abnormal Pap:  no MMG:  04/20/15 BIRADS1:neg Colonoscopy:  05/12/14 Polyps - repeat 5 years  BMD:   01/2011 Normal  TDaP:  12/2008 Pneumonia and shingles vaccines completed. Screening Labs: PCP, Urine today: PCP   reports that she has never smoked. She has never used smokeless tobacco. She reports that she does not drink alcohol or use illicit drugs.  Past Medical History  Diagnosis Date  . Colitis   . Colon polyp   . Diverticulosis   . Transfusion history     blood transfusion after childbirth  . Arthritis   . Hyperlipidemia   . Hx of colonic polyps - adenomas 08/16/2002  . History of blood transfusion     after birth of child    Past Surgical History  Procedure Laterality Date  . Knee arthroscopy Left 12/08    Dr. Noemi Davis  . 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 Noemi Davis  . Colonoscopy      polyps removed    Current Outpatient Prescriptions  Medication Sig Dispense Refill  . Calcium Carb-Cholecalciferol (CALCIUM 1000 + D PO) Take 1,000 mg by mouth daily.    . metroNIDAZOLE (METROGEL) 0.75 % gel Apply 1 application topically 2 (two) times daily. Reported on 09/04/2015    . Multiple Vitamins-Minerals (ICAPS) CAPS Take by mouth.     No current facility-administered medications for this visit.    Family History  Problem Relation Age of Onset  . Breast cancer Paternal Aunt   . Other Father     rectal polyps  . COPD Father   .  Colon cancer Mother 79    ROS:  Pertinent items are noted in HPI.  Otherwise, a comprehensive ROS was negative.  Exam:   BP 142/80 mmHg  Pulse 74  Resp 16  Ht 5' 2.5" (1.588 m)  Wt 188 lb (85.276 kg)  BMI 33.82 kg/m2  LMP 07/07/2004  Weight change: -35#   Height: 5' 2.5" (158.8 cm)  Ht Readings from Last 3 Encounters:  09/04/15 5' 2.5" (1.588 m)  07/05/14 5' 2.75" (1.594 m)  05/12/14 5\' 3"  (1.6 m)    General appearance: alert, cooperative and appears stated age Head: Normocephalic, without obvious abnormality, atraumatic Neck: no adenopathy, supple, symmetrical, trachea midline and thyroid normal to inspection and palpation Lungs: clear to auscultation bilaterally Breasts: normal appearance, no masses or tenderness Heart: regular rate and rhythm Abdomen: soft, non-tender; bowel sounds normal; no masses,  no organomegaly Extremities: extremities normal, atraumatic, no cyanosis or edema Skin: Skin color, texture, turgor normal. No rashes or lesions Lymph nodes: Cervical, supraclavicular, and axillary nodes normal. No abnormal inguinal nodes palpated Neurologic: Grossly normal   Pelvic: External genitalia:  no lesions              Urethra:  normal appearing urethra with no masses, tenderness or lesions  Bartholins and Skenes: normal                 Vagina: normal appearing vagina with normal color and discharge, no lesions              Cervix: no lesions              Pap taken: No. Bimanual Exam:  Uterus:  normal size, contour, position, consistency, mobility, non-tender              Adnexa: normal adnexa and no mass, fullness, tenderness               Rectovaginal: Confirms               Anus:  normal sphincter tone, no lesions  Chaperone was present for exam.  A:  Well Woman with normal exam PMP, no HRT H/O elevated lipids, much improved with weight less Mixed urinary incontinence Purposeful weight loss on Weight Loss  P: Mammogram yearly. pap  smear every other year per pt request. Pap normal 2015.  Labs/vaccine with Dr. Marisue Davis return annually or prn

## 2015-09-04 NOTE — Patient Instructions (Signed)
Plan bone density with your mammogram in Oct/Nov

## 2015-09-04 NOTE — Addendum Note (Signed)
Addended by: Megan Salon on: 09/04/2015 01:46 PM   Modules accepted: Miquel Dunn

## 2016-05-20 ENCOUNTER — Encounter: Payer: Self-pay | Admitting: Obstetrics & Gynecology

## 2016-08-14 ENCOUNTER — Telehealth: Payer: Self-pay | Admitting: Obstetrics & Gynecology

## 2016-08-14 NOTE — Telephone Encounter (Signed)
Spoke with patient. Patient states she received message. Patient states she called [facility] to schedule and no history of BMD on file at [facility]. Patient states she has never had BMD anywhere else. Advised patient will place call to [facility] and return call.   Spoke with Artist at [place]. Was advised last BMD on file May 04, 2007.   Return call to patient, advised last BMD 05/04/07, return call to [facility] for scheduling, order previously faxed. Patient verbalizes understanding and is agreeable.  Routing to provider for final review. Patient is agreeable to disposition. Will close encounter.

## 2016-08-14 NOTE — Telephone Encounter (Signed)
Patient called to schedule her BMD and was told that our office denied the order. Patient is asking why?

## 2016-08-14 NOTE — Telephone Encounter (Signed)
Last BMD 01/2011 -Normal. Order to Dr. Sabra Heck for signature.   BMD order faxed to [facility].   Call to patient, left detailed message, ok per current dpr. Advised order faxed to [facility] for BMD, call [facility] directly for scheduling BMD. Advised to return call to office at [phone] for any additional questions.  Routing to provider for final review. Patient is agreeable to disposition. Will close encounter.

## 2016-10-06 ENCOUNTER — Telehealth: Payer: Self-pay | Admitting: *Deleted

## 2016-10-06 NOTE — Telephone Encounter (Signed)
Message left to return call to Angelica Davis at [phone] to review BMD results.  

## 2016-10-08 NOTE — Telephone Encounter (Signed)
Patient returning call.

## 2016-10-09 NOTE — Telephone Encounter (Signed)
Patient advised of BMD results. Patient verbalized understanding and agreement.

## 2016-11-06 ENCOUNTER — Encounter: Payer: Self-pay | Admitting: Obstetrics & Gynecology

## 2016-11-07 ENCOUNTER — Encounter: Payer: Self-pay | Admitting: Obstetrics & Gynecology

## 2016-11-07 ENCOUNTER — Ambulatory Visit (INDEPENDENT_AMBULATORY_CARE_PROVIDER_SITE_OTHER): Payer: Medicare Other | Admitting: Obstetrics & Gynecology

## 2016-11-07 ENCOUNTER — Other Ambulatory Visit (HOSPITAL_COMMUNITY)
Admission: RE | Admit: 2016-11-07 | Discharge: 2016-11-07 | Disposition: A | Discharge status: Home / Self Care | Payer: Medicare Other | Source: Ambulatory Visit | Attending: Obstetrics & Gynecology | Admitting: Obstetrics & Gynecology

## 2016-11-07 VITALS — BP 128/72 | HR 76 | Resp 16 | Ht 62.5 in | Wt 210.0 lb

## 2016-11-07 DIAGNOSIS — Z124 Encounter for screening for malignant neoplasm of cervix: Secondary | ICD-10-CM

## 2016-11-07 DIAGNOSIS — Z01419 Encounter for gynecological examination (general) (routine) without abnormal findings: Secondary | ICD-10-CM

## 2016-11-07 DIAGNOSIS — R3915 Urgency of urination: Secondary | ICD-10-CM | POA: Diagnosis not present

## 2016-11-07 LAB — POCT URINALYSIS DIPSTICK
BILIRUBIN UA: NEGATIVE
GLUCOSE UA: NEGATIVE
Ketones, UA: NEGATIVE
Nitrite, UA: NEGATIVE
Protein, UA: NEGATIVE
RBC UA: NEGATIVE
Urobilinogen, UA: 0.2 E.U./dL
pH, UA: 5 (ref 5.0–8.0)

## 2016-11-07 MED ORDER — ESTRADIOL 0.1 MG/GM VA CREA: TOPICAL_CREAM | VAGINAL | 1 refills | Status: DC

## 2016-11-07 NOTE — Progress Notes (Signed)
76 y.o. G3P3 DivorcedCaucasianF here for annual exam.  Pt reports she has experienced some urinary urgency.  She feels like this has been present for several months.  No hematuria and no dysuria.  She isn't going to the bathroom more frequently.  Leaks with coughing or sneezing.  If has sensation of urgency and bladder is full, she does sometimes leak.  Did see pelvic PT and this helped.  Went on medication for cholesterol from [facility].  This is not a statin.  She is having more issues with numbness and tingling in her hands and fingers.  She wonders if these are related.  Feel it is not.  Offered referral to ortho.  She will consider.   Denies vaginal bleeding.   Patient's last menstrual period was 07/07/2004.          Sexually active: No.  The current method of family planning is post menopausal status.    Exercising: No.  The patient does not participate in regular exercise at present. Smoker:  no  Health Maintenance: Pap:  07/05/14 Neg  05/23/12 Neg   History of abnormal Pap:  no MMG:  05/05/16 BIRADS1:neg  Colonoscopy:  05/12/14 polyp. f/u 5 years  BMD:   09/19/16 Normal  TDaP:  12/2008  Pneumonia vaccine(s):  PCP Zostavax:   PCP Hep C testing: not indicated  Screening Labs: PCP, Urine today: patient requested due to urgency -- +1 WBC   reports that she has never smoked. She has never used smokeless tobacco. She reports that she does not drink alcohol or use drugs.  Past Medical History:  Diagnosis Date  . Arthritis   . Colitis   . Colon polyp   . Diverticulosis   . History of blood transfusion    after birth of child  . Hx of colonic polyps - adenomas 08/16/2002  . Hyperlipidemia   . Transfusion history    blood transfusion after childbirth    Past Surgical History:  Procedure Laterality Date  . COLONOSCOPY     polyps removed  . KNEE ARTHROSCOPY Left 12/08   Dr. Noemi Chapel  . TOTAL KNEE ARTHROPLASTY Left 6/10   Dr. Noemi Chapel    Current Outpatient Prescriptions   Medication Sig Dispense Refill  . Calcium Carb-Cholecalciferol (CALCIUM 1000 + D PO) Take 1,000 mg by mouth daily.    . metroNIDAZOLE (METROGEL) 0.75 % gel Apply 1 application topically 2 (two) times daily. Reported on 09/04/2015    . Multiple Vitamins-Minerals (ICAPS) CAPS Take by mouth.    . NON FORMULARY Take 2 capsules by mouth 2 (two) times daily. Phytomega capsule, take 2 capsules in the morning and 2 capsules at night     No current facility-administered medications for this visit.     Family History  Problem Relation Age of Onset  . Colon cancer Mother 21  . Breast cancer Paternal Aunt   . Other Father     rectal polyps  . COPD Father     ROS:  Pertinent items are noted in HPI.  Otherwise, a comprehensive ROS was negative.  Exam:   BP 128/72 (BP Location: Right Arm, Patient Position: Sitting, Cuff Size: Large)   Pulse 76   Resp 16   Ht 5' 2.5" (1.588 m)   Wt 210 lb (95.3 kg)   LMP 07/07/2004   BMI 37.80 kg/m   Weight change: +22#   Height: 5' 2.5" (158.8 cm)  Ht Readings from Last 3 Encounters:  11/07/16 5' 2.5" (1.588 m)  09/04/15 5'  2.5" (1.588 m)  07/05/14 5' 2.75" (1.594 m)    General appearance: alert, cooperative and appears stated age Head: Normocephalic, without obvious abnormality, atraumatic Neck: no adenopathy, supple, symmetrical, trachea midline and thyroid normal to inspection and palpation Lungs: clear to auscultation bilaterally Breasts: normal appearance, no masses or tenderness Heart: regular rate and rhythm Abdomen: soft, non-tender; bowel sounds normal; no masses,  no organomegaly Extremities: extremities normal, atraumatic, no cyanosis or edema Skin: Skin color, texture, turgor normal. No rashes or lesions Lymph nodes: Cervical, supraclavicular, and axillary nodes normal. No abnormal inguinal nodes palpated Neurologic: Grossly normal   Pelvic: External genitalia:  no lesions              Urethra:  normal appearing urethra with no masses,  tenderness or lesions              Bartholins and Skenes: normal                 Vagina: normal appearing vagina with normal color and discharge, no lesions, second degree cystocele              Cervix: no lesions              Pap taken: Yes.   Bimanual Exam:  Uterus:  normal size, contour, position, consistency, mobility, non-tender              Adnexa: normal adnexa and no mass, fullness, tenderness               Rectovaginal: Confirms               Anus:  Decreased anal sphincter tone, no lesions  Chaperone was present for exam.  A:  Well Woman with normal exam PMP, no HRT Mixed urinary incontinence Elevated lipids Second degree cystocele Urinary urgency that has worsened recently  P:   Mammogram guidelines reviewed pap smear obtained today Estrace vaginal cream, small amt externally two to three times weekly. Risks reviewed.  Pt declined trial of oral medication treatment for urinary urgency.   return annually or prn

## 2016-11-07 NOTE — Patient Instructions (Signed)
Shingrix.  The new shingles vaccine.  Two vaccinations.  The first one and then second one 2-6 months later.

## 2016-11-09 LAB — URINE CULTURE: Organism ID, Bacteria: NO GROWTH

## 2016-11-10 LAB — CYTOLOGY - PAP: Diagnosis: NEGATIVE

## 2016-12-19 ENCOUNTER — Ambulatory Visit: Payer: Self-pay | Admitting: Obstetrics & Gynecology

## 2016-12-22 ENCOUNTER — Telehealth: Payer: Self-pay | Admitting: Obstetrics & Gynecology

## 2016-12-22 NOTE — Telephone Encounter (Signed)
She can continue but probably doesn't need more than once weekly dosing to help maintain the improvement she's gotten.  Thanks.

## 2016-12-22 NOTE — Telephone Encounter (Signed)
Patient is calling to give information after taking her Estradiol prescription.

## 2016-12-22 NOTE — Telephone Encounter (Signed)
Spoke with patient. Patient states that she has been using Estrace vaginal cream 2 times a week since 11/07/2016. Reports she is no longer having urinary urgency. Feels much better. Asking if she can continue using the medication or if she should discontinue at this time as symptoms have resolved.

## 2016-12-23 NOTE — Telephone Encounter (Signed)
Spoke with patient. Advised of message as seen below from Dr.Miller. Patient is agreeable and verbalizes understanding.  Routing to provider for final review. Patient agreeable to disposition. Will close encounter   

## 2017-11-09 ENCOUNTER — Other Ambulatory Visit: Payer: Self-pay

## 2017-11-09 ENCOUNTER — Ambulatory Visit (INDEPENDENT_AMBULATORY_CARE_PROVIDER_SITE_OTHER): Payer: Medicare Other | Admitting: Obstetrics & Gynecology

## 2017-11-09 ENCOUNTER — Encounter: Payer: Self-pay | Admitting: Obstetrics & Gynecology

## 2017-11-09 VITALS — BP 138/70 | HR 88 | Resp 16 | Ht 62.5 in | Wt 218.0 lb

## 2017-11-09 DIAGNOSIS — Z01419 Encounter for gynecological examination (general) (routine) without abnormal findings: Secondary | ICD-10-CM

## 2017-11-09 DIAGNOSIS — R140 Abdominal distension (gaseous): Secondary | ICD-10-CM

## 2017-11-09 NOTE — Progress Notes (Signed)
77 y.o. G3P3 DivorcedCaucasianF here for annual exam. Doing well.  Denies vaginal bleeding.  Bladder issues are about the same.  Has used vaginal estrogen cream externally as needed when urinary urgency is improved.  Not using the estrogen cream right now.  Does leak some.  Wears a small pad every day.  Having some "issues" with legs.  Having leg pain/aches.  Has noticed some increased swelling in her feet this year.  Is symmetric. Not really having much today.  Also, reports issues with bloating and diarrhea after eating at [facility] where her mother lives.  Bloating is not every time she eats, no matter what she eats or where she eats.  Diarrhea is only after eating at [facility].  Denies rectal bleeding.     PCP:  Dr. Marisue Humble.  Last appt was in November.  Did blood work then.  Cholesterol was high.     Patient's last menstrual period was 07/07/2004.          Sexually active: No.  The current method of family planning is post menopausal status.    Exercising: Yes.    bike  Smoker:  no  Health Maintenance: Pap:  11/07/16 Neg   07/05/14 Neg  History of abnormal Pap:  no MMG: 07/31/17 BIRADS2:Benign  Colonoscopy:  05/12/14 polyps. F/u 5 years  BMD:   09/19/16 Normal  TDaP:  12/2008  Pneumonia vaccine(s):  done Shingrix:  No.  Pt interested in getting this.  Information given.  Hep C testing: n/a Screening Labs: PCP   reports that she has never smoked. She has never used smokeless tobacco. She reports that she does not drink alcohol or use drugs.  Past Medical History:  Diagnosis Date  . Arthritis   . Colitis   . Colon polyp   . Diverticulosis   . History of blood transfusion    after birth of child  . Hx of colonic polyps - adenomas 08/16/2002  . Hyperlipidemia   . Transfusion history    blood transfusion after childbirth    Past Surgical History:  Procedure Laterality Date  . COLONOSCOPY     polyps removed  . KNEE ARTHROSCOPY Left 12/08   Dr. Noemi Chapel  . TOTAL KNEE  ARTHROPLASTY Left 6/10   Dr. Noemi Chapel    Current Outpatient Medications  Medication Sig Dispense Refill  . Calcium Carb-Cholecalciferol (CALCIUM 1000 + D PO) Take 1,000 mg by mouth daily.    . Multiple Vitamins-Minerals (ICAPS) CAPS Take by mouth.    . NON FORMULARY Take 2 capsules by mouth 2 (two) times daily. Phytomega capsule, take 2 capsules in the morning and 2 capsules at night     No current facility-administered medications for this visit.     Family History  Problem Relation Age of Onset  . Colon cancer Mother 67  . Breast cancer Paternal Aunt   . Other Father        rectal polyps  . COPD Father     Review of Systems  Cardiovascular: Positive for leg swelling.  Gastrointestinal:       Bloating   Genitourinary:       Loss of urine spontaneously  Night urination  Loss of urine with cough or sneeze   All other systems reviewed and are negative.   Exam:   BP 138/70 (BP Location: Right Arm, Patient Position: Sitting, Cuff Size: Large)   Pulse 88   Resp 16   Ht 5' 2.5" (1.588 m)   Wt 218 lb (98.9 kg)  LMP 07/07/2004   BMI 39.24 kg/m   Height: 5' 2.5" (158.8 cm)  Ht Readings from Last 3 Encounters:  11/09/17 5' 2.5" (1.588 m)  11/07/16 5' 2.5" (1.588 m)  09/04/15 5' 2.5" (1.588 m)    General appearance: alert, cooperative and appears stated age Head: Normocephalic, without obvious abnormality, atraumatic Neck: no adenopathy, supple, symmetrical, trachea midline and thyroid normal to inspection and palpation Lungs: clear to auscultation bilaterally Breasts: normal appearance, no masses or tenderness Heart: regular rate and rhythm Abdomen: soft, non-tender; bowel sounds normal; no masses,  no organomegaly, 2.6JF umbilical hernia Extremities: extremities normal, atraumatic, no cyanosis or edema Skin: Skin color, texture, turgor normal. No rashes or lesions Lymph nodes: Cervical, supraclavicular, and axillary nodes normal. No abnormal inguinal nodes  palpated Neurologic: Grossly normal   Pelvic: External genitalia:  no lesions              Urethra:  normal appearing urethra with no masses, tenderness or lesions              Bartholins and Skenes: normal                 Vagina: normal appearing vagina with normal color and discharge, no lesions              Cervix: no lesions              Pap taken: No. Bimanual Exam:  Uterus:  normal size, contour, position, consistency, mobility, non-tender              Adnexa: normal adnexa and no mass, fullness, tenderness               Rectovaginal: Confirms               Anus:  normal sphincter tone, no lesions  Chaperone was present for exam.  A:  Well Woman with normal exam PMP, no HRT Mixed urinary incontinence Second degree cystocele LE leg pain/bilateral and symmetric edema Small, stable umbilical hernia Increased bloating with intermittent diarrhea  P:   Mammogram guidelines reviewed pap smear not indicated this year.  Neg 2018 D/w pt having vascular evaluation Information about shingrix vaccine given Lab work is UTD with Dr. Marisue Humble Return for PUS return annually or prn

## 2017-11-09 NOTE — Patient Instructions (Addendum)
[place]. [address] [place] [address] [phone]

## 2017-11-09 NOTE — Progress Notes (Signed)
Patient scheduled while in office for PUS on 11/19/17 at 3pm with consult to follow at 3:30pm with Dr. Sabra Heck. Patient is agreeable to date and time.

## 2017-11-19 ENCOUNTER — Ambulatory Visit (INDEPENDENT_AMBULATORY_CARE_PROVIDER_SITE_OTHER): Payer: Medicare Other

## 2017-11-19 ENCOUNTER — Ambulatory Visit (INDEPENDENT_AMBULATORY_CARE_PROVIDER_SITE_OTHER): Payer: Medicare Other | Admitting: Obstetrics & Gynecology

## 2017-11-19 VITALS — BP 118/64 | HR 88 | Resp 16 | Ht 62.5 in | Wt 219.0 lb

## 2017-11-19 DIAGNOSIS — R140 Abdominal distension (gaseous): Secondary | ICD-10-CM

## 2017-11-19 NOTE — Progress Notes (Signed)
77 y.o. G3P3 Divorced Caucasian female here for pelvic ultrasound due to bloating.  Pt has also been having issues with loose stools.  She has not been able to figure out the causes of her diarrhea.  It is not every day but there are some common patterns.  Certain places where she eats will cause diarrhea every time.  Feels if she were cooking all of her meals at home, she would likely not have all of these issues.    Patient's last menstrual period was 07/07/2004.  Contraception: PMP  Findings:  UTERUS: 5.7 x 3.3 x 2.2cm EMS: 1.17mm ADNEXA: Left ovary:  1.3 x 1.0 x 0.6cm       Right ovary:  0.9 x 0.9 x 0.8cm.  Ovaries do appear atrophic but evaluation is difficult due to overlying bowel CUL DE SAC: no free fluid  Discussion:  Findings and pictures reviewed.  Pt aware that ovaries appear normal.  Feel pt needs to follow up with GI for additional evaluation of diarrhea and stool issues.  States she is going to monitor to see if she can figure out any causes.  Does not want referral to GI at this time but will let me know if she does want a referral.  Questions answered.    Assessment:  Bloating Normal gyn ultrasound  Plan:  Encouraged pt to see GI and that we can help with referral if she needs.  Pt will let me know if she needs help with this.  ~15 minutes spent with patient >50% of time was in face to face discussion of above.        Addendum:Appearance of cervical polyp 9 mm

## 2017-11-22 ENCOUNTER — Encounter: Payer: Self-pay | Admitting: Obstetrics & Gynecology

## 2017-12-15 ENCOUNTER — Encounter: Payer: Self-pay | Admitting: Obstetrics & Gynecology

## 2018-08-06 ENCOUNTER — Encounter: Payer: Self-pay | Admitting: Obstetrics & Gynecology

## 2019-01-13 HISTORY — PX: CATARACT EXTRACTION: SUR2

## 2019-01-28 ENCOUNTER — Other Ambulatory Visit: Payer: Self-pay

## 2019-02-01 ENCOUNTER — Encounter: Payer: Self-pay | Admitting: Obstetrics & Gynecology

## 2019-02-01 ENCOUNTER — Other Ambulatory Visit: Payer: Self-pay

## 2019-02-01 ENCOUNTER — Ambulatory Visit (INDEPENDENT_AMBULATORY_CARE_PROVIDER_SITE_OTHER): Payer: Medicare Other | Admitting: Obstetrics & Gynecology

## 2019-02-01 VITALS — BP 134/70 | HR 68 | Temp 97.2°F | Ht 62.5 in | Wt 221.0 lb

## 2019-02-01 DIAGNOSIS — Z01419 Encounter for gynecological examination (general) (routine) without abnormal findings: Secondary | ICD-10-CM | POA: Diagnosis not present

## 2019-02-01 MED ORDER — MIRABEGRON ER 25 MG PO TB24: 25.0000 mg | ORAL_TABLET | Freq: Every day | ORAL | 1 refills | Status: DC

## 2019-02-01 NOTE — Progress Notes (Signed)
78 y.o. G3P3 Divorced White or Caucasian female here for annual exam.  Denies vaginal bleeding.  Continues to have urinary urgency and some urinary incontinence.    PCP:  Dr. Marisue Humble.  Last appt was last year.    Patient's last menstrual period was 07/07/2004.          Sexually active: No.  The current method of family planning is post menopausal status.    Exercising: Yes.    bike Smoker:  no  Health Maintenance: Pap:  11/07/16 neg   07/05/14 neg  History of abnormal Pap:  no MMG:  08/06/18 BIRADS1:Neg Colonoscopy:  05/12/14 polyps. F/u 5 years.  Aware this is due at the end of the year BMD:   09/19/16 Normal  TDaP:  2010 Pneumonia vaccine(s):  done Shingrix:   No Hep C testing: n/a Screening Labs: PCP   reports that she has never smoked. She has never used smokeless tobacco. She reports that she does not drink alcohol or use drugs.  Past Medical History:  Diagnosis Date  . Arthritis   . Colitis   . Colon polyp   . Diverticulosis   . History of blood transfusion    after birth of child  . Hx of colonic polyps - adenomas 08/16/2002  . Hyperlipidemia   . Transfusion history    blood transfusion after childbirth    Past Surgical History:  Procedure Laterality Date  . CATARACT EXTRACTION Right 01/13/2019  . COLONOSCOPY     polyps removed  . KNEE ARTHROSCOPY Left 12/08   Dr. Noemi Chapel  . TOTAL KNEE ARTHROPLASTY Left 6/10   Dr. Noemi Chapel    Current Outpatient Medications  Medication Sig Dispense Refill  . Calcium Carb-Cholecalciferol (CALCIUM 1000 + D PO) Take 1,000 mg by mouth daily.    . DUREZOL 0.05 % EMUL     . Multiple Vitamins-Minerals (ICAPS) CAPS Take by mouth.    . NON FORMULARY Take 2 capsules by mouth 2 (two) times daily. Phytomega capsule, take 2 capsules in the morning and 2 capsules at night     No current facility-administered medications for this visit.     Family History  Problem Relation Age of Onset  . Colon cancer Mother 67  . Breast cancer Paternal  Aunt   . Other Father        rectal polyps  . COPD Father     Review of Systems  Genitourinary: Positive for urgency.       Incontinence worsen    All other systems reviewed and are negative.   Exam:   BP 134/70   Pulse 68   Temp (!) 97.2 F (36.2 C) (Temporal)   Ht 5' 2.5" (1.588 m)   Wt 221 lb (100.2 kg)   LMP 07/07/2004   BMI 39.78 kg/m   Weight:  +3#  Height: 5' 2.5" (158.8 cm)  Ht Readings from Last 3 Encounters:  02/01/19 5' 2.5" (1.588 m)  11/19/17 5' 2.5" (1.588 m)  11/09/17 5' 2.5" (1.588 m)    General appearance: alert, cooperative and appears stated age Head: Normocephalic, without obvious abnormality, atraumatic Neck: no adenopathy, supple, symmetrical, trachea midline and thyroid normal to inspection and palpation Lungs: clear to auscultation bilaterally Breasts: normal appearance, no masses or tenderness Heart: regular rate and rhythm Abdomen: soft, non-tender; bowel sounds normal; no masses,  no organomegaly Extremities: extremities normal, atraumatic, no cyanosis or edema Skin: Skin color, texture, turgor normal. No rashes or lesions Lymph nodes: Cervical, supraclavicular, and axillary nodes  normal. No abnormal inguinal nodes palpated Neurologic: Grossly normal   Pelvic: External genitalia:  no lesions              Urethra:  normal appearing urethra with no masses, tenderness or lesions              Bartholins and Skenes: normal                 Vagina: normal appearing vagina with normal color and discharge, no lesions              Cervix: no lesions              Pap taken: No. Bimanual Exam:  Uterus:  normal size, contour, position, consistency, mobility, non-tender              Adnexa: no mass, fullness, tenderness               Rectovaginal: Confirms               Anus:  normal sphincter tone, no lesions  Chaperone was present for exam.  A:  Well Woman with normal exam PMP, no HRT Mixed urinary incontinence Cystocele Small, stable  umbilical hernia family hx of colon cancer, personal hx of colon polyps  P:   Mammogram guidelines reviewed.  This is UTD. pap smear not indicated Blood work done with Dr. Marisue Humble Vaccines record will be obtained Trial of Myrbetriq 25mg  daily to see how much improvement #30/1RF Return annually or prn

## 2019-02-22 ENCOUNTER — Telehealth: Payer: Self-pay | Admitting: Obstetrics & Gynecology

## 2019-02-22 NOTE — Telephone Encounter (Signed)
Call to patient. Message given to patient as seen below from Dr. Sabra Heck and patient verbalized understanding. Patient states she will increase dosage and monitor blood pressure and call with an update in 2 weeks.   Routing to provider and will close encounter.

## 2019-02-22 NOTE — Telephone Encounter (Signed)
The 50mg  dosage tends to cause less BP issues.  It doesn't make any sense, I know, but my suggestion would be to increase to the 50mg  and continue to monitor BPs over the next two weeks.  She can take two of what she has until they run out and we can sent in a new rx for the 50mg  dosage.  I would like an update in two weeks.  Thanks.

## 2019-02-22 NOTE — Telephone Encounter (Signed)
Call to patient. Patient calling to give update after starting Myrbetriq. Patient states "I'm doing good, I know it's working." States she bought a BP monitor and has been following her BP's as requested by Dr. Sabra Heck. Patient stated blood pressure readings as follows:  02-10-2019 118/72, HR= 84 02-12-2019 136/68, HR= 83 02-16-2019 135/70, HR= 81 02-21-2019 (AM) 142/76, HR= 84 02-21-2019 (PM) 158/77 02-22-2019 138/83, HR = 75  Patient states she is concerned that her blood pressure is rising. Patient asking if Dr. Sabra Heck thinks it's feasible for her to take the medication every other day? RN advised would review with Dr. Sabra Heck and return call with recommendations. Patient agreeable.

## 2019-02-22 NOTE — Telephone Encounter (Signed)
Patient want to give nurse an update on how she is doing on the new medication.

## 2019-03-04 ENCOUNTER — Telehealth: Payer: Self-pay | Admitting: Obstetrics & Gynecology

## 2019-03-04 MED ORDER — MIRABEGRON ER 50 MG PO TB24: 50.0000 mg | ORAL_TABLET | Freq: Every day | ORAL | 12 refills | Status: DC

## 2019-03-04 NOTE — Telephone Encounter (Signed)
Patient called again stating when the nurse returns her call she will need to request a refill of the medication.

## 2019-03-04 NOTE — Telephone Encounter (Signed)
Refill done for #30/12 RFs.  I'm glad this is helping.  Ok to close encounter.

## 2019-03-04 NOTE — Telephone Encounter (Signed)
Patient want to let nurse know how she is doing on medication and give a blood pressure update.

## 2019-03-04 NOTE — Telephone Encounter (Signed)
Spoke with patient, calling with med update. Increased Myrbetriq to 50 mg daily on 02/22/19. Patient states, "I am pleased with improvements". Did miss 2 doses on the day she had cateract surgery.   B/P readings:  8/24: am 126/74, p 100          pm  1330/78, p 83 8/25: 117/79, p 92          144/78, p 80 8/26: 115/78, p 98           148/87, p 97 8/27: 126/81, p 83 8/28: 148/75, p 77  Patient requesting 90 days supply of medication, if appropriate to continue. Pharmacy confirmed.   Dr. Sabra Heck -please advise on RX.

## 2019-04-18 ENCOUNTER — Ambulatory Visit (INDEPENDENT_AMBULATORY_CARE_PROVIDER_SITE_OTHER): Payer: Medicare Other | Admitting: Obstetrics and Gynecology

## 2019-04-18 ENCOUNTER — Other Ambulatory Visit: Payer: Self-pay

## 2019-04-18 ENCOUNTER — Encounter: Payer: Self-pay | Admitting: Obstetrics and Gynecology

## 2019-04-18 ENCOUNTER — Telehealth: Payer: Self-pay | Admitting: Obstetrics & Gynecology

## 2019-04-18 VITALS — BP 138/70 | HR 72 | Temp 97.2°F | Wt 208.0 lb

## 2019-04-18 DIAGNOSIS — N949 Unspecified condition associated with female genital organs and menstrual cycle: Secondary | ICD-10-CM

## 2019-04-18 DIAGNOSIS — N3289 Other specified disorders of bladder: Secondary | ICD-10-CM

## 2019-04-18 DIAGNOSIS — R109 Unspecified abdominal pain: Secondary | ICD-10-CM | POA: Diagnosis not present

## 2019-04-18 DIAGNOSIS — R10814 Left lower quadrant abdominal tenderness: Secondary | ICD-10-CM

## 2019-04-18 DIAGNOSIS — R102 Pelvic and perineal pain: Secondary | ICD-10-CM

## 2019-04-18 NOTE — Telephone Encounter (Signed)
Call to patient. Patient states she took her Myrbetriq yesterday am. Last night around 2330 woke up with pain that felt like "child birth." States, "I couldn't even walk to the bathroom." Used a heating pad, but did not take any OTC medication because she was unsure what to take. Patient states she feels the Myrbetriq is affecting her bowels. States it causes her stool to be round balls, but denies constipation. States her stool was "looser" last night. Denies urinary symptoms or fever. States she is feeling better this am, but is just "sore." OV recommended. Patient agreeable, states, "I can't go through another night like that." RN advised Dr. Sabra Heck out of the office today, would need to schedule with covering provider. Patient agreeable. Patient scheduled for 04-18-2019 at 1315 with Dr. Talbert Nan. Patient agreeable to date and time of appointment. Covid prescreening negative.   Routing to provider and will close encounter.   Cc Dr. Talbert Nan

## 2019-04-18 NOTE — Telephone Encounter (Signed)
Patient called to report that she was "in so much pain last night she hardly slept" and this was after taking Myrbetriq.

## 2019-04-18 NOTE — Patient Instructions (Signed)
Call with worsening pain or if the pain doesn't resolve in the next few days. Call with any other concerns.  You can take Ibuprofen or tylenol for pain.

## 2019-04-18 NOTE — Progress Notes (Signed)
GYNECOLOGY  VISIT   HPI: 78 y.o.   Divorced White or Caucasian Not Hispanic or Latino  female   G3P3 with Patient's last menstrual period was 07/07/2004.   here for pain in her lower abdomen after taking Mybetriq in the AM yesterday. Pain started late yesterday. Reports pain is intermittent today. She started on 25 mg of Myrbetriq in July, went up to 50 mg shortly after and has been doing well. She took her Myrbetriq yesterday morning, she was awoken at 11:30 last night with what felt like labor pains. For the first 6 hours the pain was constant and up to an 8/10 in severity. This morning the pain has eased up. Still hurting, now just intermittent and more mild. Every time she needs to void she has some pain. When she voids she feels some relief. A couple of times she thinks she has had a bladder spasm. She feels she is emptying her bladder all the way. No increase in urinary frequency or urgency. No fever, no nausea or emesis. Some mild lower back pain.  Her bowel movements have changed from normal to little balls in the last few months, not hard, not straining. Slightly softer in the last day.   GYNECOLOGIC HISTORY: Patient's last menstrual period was 07/07/2004. Contraception: Postmenopausal Menopausal hormone therapy: None        OB History    Gravida  3   Para  3   Term      Preterm      AB      Living  3     SAB      TAB      Ectopic      Multiple      Live Births                 Patient Active Problem List   Diagnosis Date Noted  . Elevated lipids 09/04/2015  . Hx of colonic polyps - adenomas 08/16/2002    Past Medical History:  Diagnosis Date  . Arthritis   . Colitis   . Diverticulosis   . History of blood transfusion    after birth of child  . Hx of colonic polyps - adenomas 08/16/2002  . Hyperlipidemia     Past Surgical History:  Procedure Laterality Date  . CATARACT EXTRACTION Right 01/13/2019  . COLONOSCOPY     polyps removed  . KNEE  ARTHROSCOPY Left 12/08   Dr. Noemi Chapel  . TOTAL KNEE ARTHROPLASTY Left 6/10   Dr. Noemi Chapel    Current Outpatient Medications  Medication Sig Dispense Refill  . Calcium Carb-Cholecalciferol (CALCIUM 1000 + D PO) Take 1,000 mg by mouth daily.    . DUREZOL 0.05 % EMUL     . mirabegron ER (MYRBETRIQ) 50 MG TB24 tablet Take 1 tablet (50 mg total) by mouth daily. One po qd 30 tablet 12  . Multiple Vitamins-Minerals (ICAPS) CAPS Take by mouth.    . NON FORMULARY Take 2 capsules by mouth 2 (two) times daily. Phytomega capsule, take 2 capsules in the morning and 2 capsules at night     No current facility-administered medications for this visit.      ALLERGIES: Patient has no known allergies.  Family History  Problem Relation Age of Onset  . Colon cancer Mother 51  . Breast cancer Paternal Aunt   . Other Father        rectal polyps  . COPD Father     Social History   Socioeconomic History  .  Marital status: Divorced    Spouse name: Not on file  . Number of children: Not on file  . Years of education: Not on file  . Highest education level: Not on file  Occupational History  . Not on file  Social Needs  . Financial resource strain: Not on file  . Food insecurity    Worry: Not on file    Inability: Not on file  . Transportation needs    Medical: Not on file    Non-medical: Not on file  Tobacco Use  . Smoking status: Never Smoker  . Smokeless tobacco: Never Used  Substance and Sexual Activity  . Alcohol use: No  . Drug use: No  . Sexual activity: Not Currently    Birth control/protection: Post-menopausal  Lifestyle  . Physical activity    Days per week: Not on file    Minutes per session: Not on file  . Stress: Not on file  Relationships  . Social Herbalist on phone: Not on file    Gets together: Not on file    Attends religious service: Not on file    Active member of club or organization: Not on file    Attends meetings of clubs or organizations: Not on  file    Relationship status: Not on file  . Intimate partner violence    Fear of current or ex partner: Not on file    Emotionally abused: Not on file    Physically abused: Not on file    Forced sexual activity: Not on file  Other Topics Concern  . Not on file  Social History Narrative  . Not on file    Review of Systems  Constitutional: Negative.   HENT: Negative.   Eyes: Negative.   Respiratory: Negative.   Cardiovascular: Negative.   Gastrointestinal:       Change in character of stools  Genitourinary: Negative.   Musculoskeletal: Negative.   Skin: Negative.   Neurological: Negative.   Endo/Heme/Allergies: Negative.   Psychiatric/Behavioral: Negative.     PHYSICAL EXAMINATION:    BP 138/70 (BP Location: Right Arm, Patient Position: Sitting, Cuff Size: Large)   Pulse 72   Temp (!) 97.2 F (36.2 C) (Skin)   Wt 208 lb (94.3 kg)   LMP 07/07/2004   BMI 37.44 kg/m     General appearance: alert, cooperative and appears stated age Abdomen: soft, moderately tender in the LLQ in the region of the descending colon, no rebound, no guarding, no masses,  no organomegaly  Pelvic: External genitalia:  no lesions              Urethra:  normal appearing urethra with no masses, tenderness or lesions              Bartholins and Skenes: normal                 Vagina: normal appearing vagina with normal color and discharge, no lesions              Cervix: no cervical motion tenderness and no lesions              Bimanual Exam:  Uterus:  anteverted, mobile, tender, normal sized              Adnexa: no mass, fullness, tenderness              Rectovaginal: Yes.  .  Confirms.  Anus:  normal sphincter tone, no lesions  Chaperone was present for exam.  ASSESSMENT Abdominal/pelvic pain, severe last night in her whole lower abdomen. No significant bowel changes, possible bladder spasms, no other urinary c/o. Tender in the LLQ and with palpation of her uterus. Discussed  possible etiologies of her pain are GI, GU and GYN    PLAN Urine for ua, c&s CBC with diff If her pain doesn't continue to improve would recommend imaging Can take tylenol or ibuprofen for pain. Hold on the myrbetriq for the immediate future   An After Visit Summary was printed and given to the patient.  ~20 minutes face to face time of which over 50% was spent in counseling.   CC: Dr Sabra Heck

## 2019-04-19 ENCOUNTER — Telehealth: Payer: Self-pay | Admitting: *Deleted

## 2019-04-19 LAB — CBC WITH DIFFERENTIAL/PLATELET
Basophils Absolute: 0 10*3/uL (ref 0.0–0.2)
Basos: 0 %
EOS (ABSOLUTE): 0 10*3/uL (ref 0.0–0.4)
Eos: 0 %
Hematocrit: 40.3 % (ref 34.0–46.6)
Hemoglobin: 13 g/dL (ref 11.1–15.9)
Immature Grans (Abs): 0 10*3/uL (ref 0.0–0.1)
Immature Granulocytes: 0 %
Lymphocytes Absolute: 1.7 10*3/uL (ref 0.7–3.1)
Lymphs: 17 %
MCH: 28 pg (ref 26.6–33.0)
MCHC: 32.3 g/dL (ref 31.5–35.7)
MCV: 87 fL (ref 79–97)
Monocytes Absolute: 0.9 10*3/uL (ref 0.1–0.9)
Monocytes: 8 %
Neutrophils Absolute: 7.8 10*3/uL — ABNORMAL HIGH (ref 1.4–7.0)
Neutrophils: 75 %
Platelets: 220 10*3/uL (ref 150–450)
RBC: 4.65 x10E6/uL (ref 3.77–5.28)
RDW: 13.4 % (ref 11.7–15.4)
WBC: 10.5 10*3/uL (ref 3.4–10.8)

## 2019-04-19 LAB — URINALYSIS, MICROSCOPIC ONLY
Bacteria, UA: NONE SEEN
Casts: NONE SEEN /lpf
Epithelial Cells (non renal): >10 /hpf — AB (ref 0–10)

## 2019-04-19 NOTE — Telephone Encounter (Signed)
-----   Message from Salvadore Dom, MD sent at 04/19/2019  2:34 PM EDT ----- Please let the patient know that her microscopic urinalysis is fine, culture is pending.  Her WBC count is high normal with a slight increase in neutrophils which could indicate the start of an infection. Please check on how she is feeling today.

## 2019-04-19 NOTE — Telephone Encounter (Signed)
Notes recorded by Burnice Logan, RN on 04/19/2019 at 2:46 PM EDT  Left message to call Sharee Pimple, RN at [facility].

## 2019-04-20 LAB — URINE CULTURE

## 2019-04-20 NOTE — Telephone Encounter (Signed)
Patient says she is starting to cramp again.

## 2019-04-20 NOTE — Telephone Encounter (Signed)
Spoke with patient. Advised as seen below per Dr. Talbert Nan. Advised UC has since returned, Mixed urogenital flora  10,000-25,000 colony forming units per mL.   Patient reports she is feeling better today, started improving yesterday afternoon, states "I just feel washed out". Reports intermittent, quick shooting pain in lower abdomen/pelvis. Denies any new or worsening symptoms.   Advised patient I will update Dr. Talbert Nan and return call with recommendations. Patient does have a PCP.    Dr. Talbert Nan -please review and advise.

## 2019-04-20 NOTE — Telephone Encounter (Signed)
If she isn't continuing to improve throughout today/tomorrow she should be seen again (either by Dr Sabra Heck or her primary).  CC: Dr Sabra Heck

## 2019-04-20 NOTE — Telephone Encounter (Signed)
Call reviewed with Dr. Sabra Heck, call returned to patient. Advised patient to f/u with PCP for further evaluation. Patient will contact PCP. Confirmed with patient last dose of Myrbetriq 04/17/19. Patient verbalizes understanding and is agreeable to plan.   Routing to provider for final review. Patient is agreeable to disposition. Will close encounter.

## 2019-04-21 ENCOUNTER — Encounter: Payer: Self-pay | Admitting: Internal Medicine

## 2019-04-21 ENCOUNTER — Telehealth: Payer: Self-pay | Admitting: Internal Medicine

## 2019-04-21 NOTE — Telephone Encounter (Signed)
Pt reported that she has been experiencing abd p.  She is scheduled for a recall colon May 10, 2019.  Please advise.

## 2019-04-21 NOTE — Telephone Encounter (Signed)
Left message for patient to call back  

## 2019-04-26 NOTE — Telephone Encounter (Signed)
Patient reports that her symptoms have resolved and wants to keep her appt as scheduled.  She understands to call back if she has any new GI concerns.

## 2019-05-02 ENCOUNTER — Ambulatory Visit (AMBULATORY_SURGERY_CENTER): Payer: Medicare Other | Admitting: *Deleted

## 2019-05-02 ENCOUNTER — Encounter: Payer: Self-pay | Admitting: Internal Medicine

## 2019-05-02 ENCOUNTER — Other Ambulatory Visit: Payer: Self-pay

## 2019-05-02 VITALS — Temp 96.8°F | Ht 62.5 in | Wt 208.0 lb

## 2019-05-02 DIAGNOSIS — Z86010 Personal history of colonic polyps: Secondary | ICD-10-CM

## 2019-05-02 DIAGNOSIS — Z1159 Encounter for screening for other viral diseases: Secondary | ICD-10-CM

## 2019-05-02 NOTE — Progress Notes (Signed)
Patient is here in-person for PV. Patient denies any allergies to eggs or soy. Patient denies any problems with anesthesia/sedation. Patient denies any oxygen use at home. Patient denies taking any diet/weight loss medications or blood thinners. Patient is not being treated for MRSA or C-diff.  COVID-19 screening test is on 10/29 at 950am, the pt is aware.   Pt is aware that care partner will wait in the car during procedure; if they feel like they will be too hot or cold to wait in the car; they may wait in the 4 th floor lobby. Patient is aware to bring only one care partner. We want them to wear a mask (we do not have any that we can provide them), practice social distancing, and we will check their temperatures when they get here.  I did remind the patient that their care partner needs to stay in the parking lot the entire time and have a cell phone available, we will call them when the pt is ready for discharge. Patient will wear mask into building.

## 2019-05-05 LAB — SARS CORONAVIRUS 2 (TAT 6-24 HRS): SARS Coronavirus 2: NEGATIVE

## 2019-05-10 ENCOUNTER — Ambulatory Visit (AMBULATORY_SURGERY_CENTER): Payer: Medicare Other | Admitting: Internal Medicine

## 2019-05-10 ENCOUNTER — Encounter: Payer: Self-pay | Admitting: Internal Medicine

## 2019-05-10 ENCOUNTER — Other Ambulatory Visit: Payer: Self-pay

## 2019-05-10 VITALS — BP 131/62 | HR 65 | Temp 98.0°F | Resp 14 | Ht 62.5 in | Wt 208.0 lb

## 2019-05-10 DIAGNOSIS — Z86010 Personal history of colonic polyps: Secondary | ICD-10-CM | POA: Diagnosis not present

## 2019-05-10 DIAGNOSIS — D120 Benign neoplasm of cecum: Secondary | ICD-10-CM

## 2019-05-10 DIAGNOSIS — K635 Polyp of colon: Secondary | ICD-10-CM | POA: Diagnosis not present

## 2019-05-10 DIAGNOSIS — D123 Benign neoplasm of transverse colon: Secondary | ICD-10-CM | POA: Diagnosis not present

## 2019-05-10 DIAGNOSIS — D129 Benign neoplasm of anus and anal canal: Secondary | ICD-10-CM

## 2019-05-10 DIAGNOSIS — D128 Benign neoplasm of rectum: Secondary | ICD-10-CM | POA: Diagnosis not present

## 2019-05-10 MED ORDER — SODIUM CHLORIDE 0.9 % IV SOLN: 500.0000 mL | Freq: Once | INTRAVENOUS | Status: DC

## 2019-05-10 NOTE — Progress Notes (Signed)
Pt tolerated procedure well. VSS. Awake and to recovery.

## 2019-05-10 NOTE — Patient Instructions (Addendum)
I found and removed 4 tiny polyps.  You also have diverticulosis - a common condition that is not usually a problem.  I will let you know pathology results and when/if to have another routine colonoscopy by mail and/or My Chart.  I appreciate the opportunity to care for you. Gatha Mayer, MD, [facility]  Polyp handout given to patient.; Diverticulosis handout given to patient.  Resume previous diet. Continue present medications.  YOU HAD AN ENDOSCOPIC PROCEDURE TODAY AT [facility]:   Refer to the procedure report that was given to you for any specific questions about what was found during the examination.  If the procedure report does not answer your questions, please call your gastroenterologist to clarify.  If you requested that your care partner not be given the details of your procedure findings, then the procedure report has been included in a sealed envelope for you to review at your convenience later.  YOU SHOULD EXPECT: Some feelings of bloating in the abdomen. Passage of more gas than usual.  Walking can help get rid of the air that was put into your GI tract during the procedure and reduce the bloating. If you had a lower endoscopy (such as a colonoscopy or flexible sigmoidoscopy) you may notice spotting of blood in your stool or on the toilet paper. If you underwent a bowel prep for your procedure, you may not have a normal bowel movement for a few days.  Please Note:  You might notice some irritation and congestion in your nose or some drainage.  This is from the oxygen used during your procedure.  There is no need for concern and it should clear up in a day or so.  SYMPTOMS TO REPORT IMMEDIATELY:   Following lower endoscopy (colonoscopy or flexible sigmoidoscopy):  Excessive amounts of blood in the stool  Significant tenderness or worsening of abdominal pains  Swelling of the abdomen that is new, acute  Fever of 100F or higher For urgent or emergent issues, a  gastroenterologist can be reached at any hour by calling [phone].   DIET:  We do recommend a small meal at first, but then you may proceed to your regular diet.  Drink plenty of fluids but you should avoid alcoholic beverages for 24 hours.  ACTIVITY:  You should plan to take it easy for the rest of today and you should NOT DRIVE or use heavy machinery until tomorrow (because of the sedation medicines used during the test).    FOLLOW UP: Our staff will call the number listed on your records 48-72 hours following your procedure to check on you and address any questions or concerns that you may have regarding the information given to you following your procedure. If we do not reach you, we will leave a message.  We will attempt to reach you two times.  During this call, we will ask if you have developed any symptoms of COVID 19. If you develop any symptoms (ie: fever, flu-like symptoms, shortness of breath, cough etc.) before then, please call [phone].  If you test positive for Covid 19 in the 2 weeks post procedure, please call and report this information to [place].    If any biopsies were taken you will be contacted by phone or by letter within the next 1-3 weeks.  Please call us at [phone] if you have not heard about the biopsies in 3 weeks.    SIGNATURES/CONFIDENTIALITY: You and/or your care partner have signed paperwork which will  be entered into your electronic medical record.  These signatures attest to the fact that that the information above on your After Visit Summary has been reviewed and is understood.  Full responsibility of the confidentiality of this discharge information lies with you and/or your care-partner. 

## 2019-05-10 NOTE — Progress Notes (Signed)
LC - Temp  CW - VS  Pt's states no medical or surgical changes since previsit or office visit.

## 2019-05-10 NOTE — Op Note (Addendum)
[place] Patient Name: Angelica Davis Procedure Date: 05/10/2019 9:13 AM MRN: [id number] Endoscopist: Gatha Mayer , MD Age: 78 Referring MD:  Date of Birth: 1941/03/29 Gender: Female Account #: [id number] Procedure:                Colonoscopy Medicines:                Monitored Anesthesia Care Procedure:                Pre-Anesthesia Assessment:                           - Prior to the procedure, a History and Physical                            was performed, and patient medications and                            allergies were reviewed. The patient's tolerance of                            previous anesthesia was also reviewed. The risks                            and benefits of the procedure and the sedation                            options and risks were discussed with the patient.                            All questions were answered, and informed consent                            was obtained. Prior Anticoagulants: The patient has                            taken no previous anticoagulant or antiplatelet                            agents. ASA Grade Assessment: II - A patient with                            mild systemic disease. After reviewing the risks                            and benefits, the patient was deemed in                            satisfactory condition to undergo the procedure.                           After obtaining informed consent, the colonoscope                            was passed under direct vision. Throughout the  procedure, the patient's blood pressure, pulse, and                            oxygen saturations were monitored continuously. The                            Colonoscope was introduced through the anus and                            advanced to the the cecum, identified by                            appendiceal orifice and ileocecal valve. The                            colonoscopy was performed  without difficulty. The                            patient tolerated the procedure well. The quality                            of the bowel preparation was excellent. The bowel                            preparation used was Miralax via split dose                            instruction. The ileocecal valve, appendiceal                            orifice, and rectum were photographed. Scope In: 9:23:38 AM Scope Out: 9:44:22 AM Scope Withdrawal Time: 0 hours 16 minutes 54 seconds  Total Procedure Duration: 0 hours 20 minutes 44 seconds  Findings:                 The perianal and digital rectal examinations were                            normal.                           Four sessile polyps were found in the rectum,                            transverse colon and cecum. The polyps were                            diminutive in size. These polyps were removed with                            a cold snare. Resection and retrieval were                            complete. Verification of patient identification  for the specimen was done. Estimated blood loss was                            minimal.                           Multiple diverticula were found in the left colon.                           The exam was otherwise without abnormality on                            direct and retroflexion views. Complications:            No immediate complications. Estimated Blood Loss:     Estimated blood loss was minimal. Impression:               - Four diminutive polyps in the rectum, in the                            transverse colon and in the cecum, removed with a                            cold snare. Resected and retrieved.                           - Diverticulosis in the left colon.                           - The examination was otherwise normal on direct                            and retroflexion views.                           - Personal history of colonic  polyps. + FHx colon                            ca elderly mom (84) and polyps in dad Recommendation:           - Patient has a contact number available for                            emergencies. The signs and symptoms of potential                            delayed complications were discussed with the                            patient. Return to normal activities tomorrow.                            Written discharge instructions were provided to the  patient.                           - Resume previous diet.                           - Continue present medications.                           - No recommendation at this time regarding repeat                            colonoscopy due to age. Gatha Mayer, MD 05/10/2019 9:56:44 AM This report has been signed electronically. Addendum Number: 1   Addendum Date: 05/23/2019 4:52:55 PM      INDICATION FOR COLONOSCOPY      Personal history of colon polyps Gatha Mayer, MD 05/23/2019 4:53:15 PM This report has been signed electronically.

## 2019-05-10 NOTE — Progress Notes (Signed)
Called to room to assist during endoscopic procedure.  Patient ID and intended procedure confirmed with present staff. Received instructions for my participation in the procedure from the performing physician.  

## 2019-05-12 ENCOUNTER — Telehealth: Payer: Self-pay | Admitting: *Deleted

## 2019-05-12 NOTE — Telephone Encounter (Signed)
  Follow up Call-  Call back number 05/10/2019  Post procedure Call Back phone  # [phone] cell  Permission to leave phone message Yes  Some recent data might be hidden     Patient questions:  Do you have a fever, pain , or abdominal swelling? No. Pain Score  0 *  Have you tolerated food without any problems? Yes.    Have you been able to return to your normal activities? Yes.    Do you have any questions about your discharge instructions: Diet   No. Medications  No. Follow up visit  No.  Do you have questions or concerns about your Care? No.  Actions: * If pain score is 4 or above: No action needed, pain <4.  1. Have you developed a fever since your procedure? no  2.   Have you had an respiratory symptoms (SOB or cough) since your procedure? no  3.   Have you tested positive for COVID 19 since your procedure no  4.   Have you had any family members/close contacts diagnosed with the COVID 19 since your procedure?  no   If yes to any of these questions please route to Joylene John, RN and Alphonsa Gin, Therapist, sports.

## 2019-05-23 ENCOUNTER — Encounter: Payer: Self-pay | Admitting: Internal Medicine

## 2019-05-23 DIAGNOSIS — Z86010 Personal history of colonic polyps: Secondary | ICD-10-CM

## 2019-05-23 NOTE — Progress Notes (Signed)
Mix ssp/adenomas - 4 diminutive polyps Recall for discussion at 74 - 2023 Also has FHx CRCA elderly mom

## 2019-08-08 ENCOUNTER — Encounter: Payer: Self-pay | Admitting: Obstetrics & Gynecology

## 2019-08-11 ENCOUNTER — Telehealth: Payer: Self-pay

## 2019-08-11 NOTE — Telephone Encounter (Signed)
Discussed BMD results with patient in detail as written by provider. Closing encounter.

## 2019-11-16 DIAGNOSIS — M79642 Pain in left hand: Secondary | ICD-10-CM | POA: Insufficient documentation

## 2019-11-16 DIAGNOSIS — M6530 Trigger finger, unspecified finger: Secondary | ICD-10-CM | POA: Insufficient documentation

## 2020-04-10 NOTE — Progress Notes (Signed)
79 y.o. G3P3 Divorced White or Caucasian female here for breast and pelvic exam.  I am also following her for her cystocele, OAB and urinary incontinence.  She took myrbetriq last year and this really helped but after increasing dosage to 50mg , had pelvic pain  Urine testing was negative but she stopped medication and this resolved.  Would consider additional treatment.  Reports she woke up with urinary urgency and hesitancy.  She denies seeing blood in her urine.  She tried myrbetriq and it did help her urgency.  However, she had bladder pain with this so stopped.    Denies vaginal bleeding.  poct urine-rbc tr, wbc 1+, nitirite+  Patient's last menstrual period was 07/07/2004.          Sexually active: No.  H/O STD:  no  Health Maintenance: PCP:  Gaynelle Arabian  Last wellness appt was 07/2019  Did blood work at that appt: yes.  Reports blood work was all "off" Vaccines are up to date: to the best of pt's knowledge, she is up to date. Colonoscopy:  05-10-2019 MMG:  08-08-2019 category b density birads 1:neg BMD:  08-08-2019 normal f/u 47yrs.  Done with Dr. Marisue Humble. Last pap smear:  11-07-16 neg.   H/o abnormal pap smear: no    reports that she has never smoked. She has never used smokeless tobacco. She reports that she does not drink alcohol and does not use drugs.  Past Medical History:  Diagnosis Date  . Arthritis   . Colitis   . Diverticulosis   . History of blood transfusion    after birth of child  . Hx of colonic polyps - adenomas 08/16/2002  . Hyperlipidemia   . Skin cancer    skin     Past Surgical History:  Procedure Laterality Date  . CATARACT EXTRACTION Right 01/13/2019  . COLONOSCOPY  last 05/12/2014   polyps removed  . KNEE ARTHROSCOPY Left 12/08   Dr. Noemi Chapel  . TOTAL KNEE ARTHROPLASTY Left 6/10   Dr. Noemi Chapel    Current Outpatient Medications  Medication Sig Dispense Refill  . Ascorbic Acid (VITAMIN C PO) Take by mouth.    Marland Kitchen CALCIUM PO Take by mouth.    Marland Kitchen TART  CHERRY PO Take by mouth.    Marland Kitchen VITAMIN D PO Take by mouth.     No current facility-administered medications for this visit.    Family History  Problem Relation Age of Onset  . Colon cancer Mother 86  . Breast cancer Paternal Aunt   . Other Father        rectal polyps  . COPD Father   . Colon polyps Father   . Esophageal cancer Neg Hx   . Rectal cancer Neg Hx   . Stomach cancer Neg Hx     Review of Systems  Constitutional: Negative.   HENT: Negative.   Eyes: Negative.   Respiratory: Negative.   Cardiovascular: Negative.   Gastrointestinal: Negative.   Endocrine: Negative.   Genitourinary:       Trouble emptying bladder  Musculoskeletal: Negative.   Skin: Negative.   Allergic/Immunologic: Negative.   Neurological: Negative.   Hematological: Negative.   Psychiatric/Behavioral: Negative.     Exam:   BP 120/82   Pulse 68   Resp 16   Ht 5' 2.5" (1.588 m)   Wt 221 lb (100.2 kg)   LMP 07/07/2004   BMI 39.78 kg/m   Height: 5' 2.5" (158.8 cm)  General appearance: alert, cooperative and appears stated  age Breasts: normal appearance, no masses or tenderness Abdomen: soft, non-tender; bowel sounds normal; no masses,  no organomegaly Lymph nodes: Cervical, supraclavicular, and axillary nodes normal.  No abnormal inguinal nodes palpated Neurologic: Grossly normal  Pelvic: External genitalia:  no lesions              Urethra:  normal appearing urethra with no masses, tenderness or lesions              Bartholins and Skenes: normal                 Vagina: normal appearing vagina with normal color and discharge, no lesions              Cervix: no lesions              Pap taken: No. Bimanual Exam:  Uterus:  normal size, contour, position, consistency, mobility, non-tender              Adnexa: normal adnexa and no mass, fullness, tenderness               Rectovaginal: Confirms               Anus:  normal sphincter tone, no lesions  Chaperone, Royal Hawthorn, CMA, was present  for exam.  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 Marisue Humble Colonoscopy done 05/2019 BMD 2019 Urine micro and culture pending.  Bactrim DS BId x 3 days return annually or prn  20 minutes of total time was spent for this patient encounter, including preparation, face-to-face counseling with the patient and coordination of care, and documentation of the encounter.

## 2020-04-13 ENCOUNTER — Ambulatory Visit (INDEPENDENT_AMBULATORY_CARE_PROVIDER_SITE_OTHER): Payer: Medicare Other | Admitting: Obstetrics & Gynecology

## 2020-04-13 ENCOUNTER — Other Ambulatory Visit: Payer: Self-pay

## 2020-04-13 ENCOUNTER — Encounter: Payer: Self-pay | Admitting: Obstetrics & Gynecology

## 2020-04-13 VITALS — BP 120/82 | HR 68 | Resp 16 | Ht 62.5 in | Wt 221.0 lb

## 2020-04-13 DIAGNOSIS — N390 Urinary tract infection, site not specified: Secondary | ICD-10-CM | POA: Diagnosis not present

## 2020-04-13 DIAGNOSIS — Z01411 Encounter for gynecological examination (general) (routine) with abnormal findings: Secondary | ICD-10-CM | POA: Diagnosis not present

## 2020-04-13 DIAGNOSIS — R319 Hematuria, unspecified: Secondary | ICD-10-CM | POA: Diagnosis not present

## 2020-04-13 DIAGNOSIS — Z0000 Encounter for general adult medical examination without abnormal findings: Secondary | ICD-10-CM

## 2020-04-13 LAB — POCT URINALYSIS DIPSTICK
Bilirubin, UA: NEGATIVE
Glucose, UA: NEGATIVE
Ketones, UA: NEGATIVE
Nitrite, UA: POSITIVE
Protein, UA: NEGATIVE
Urobilinogen, UA: NEGATIVE E.U./dL — AB
pH, UA: 5 (ref 5.0–8.0)

## 2020-04-13 MED ORDER — SULFAMETHOXAZOLE-TRIMETHOPRIM 800-160 MG PO TABS: 1.0000 | ORAL_TABLET | Freq: Two times a day (BID) | ORAL | 0 refills | Status: DC

## 2020-04-14 LAB — URINALYSIS, MICROSCOPIC ONLY
Casts: NONE SEEN /lpf
WBC, UA: >30 /hpf — AB (ref 0–5)

## 2020-04-16 LAB — URINE CULTURE

## 2020-04-23 ENCOUNTER — Ambulatory Visit (INDEPENDENT_AMBULATORY_CARE_PROVIDER_SITE_OTHER): Payer: Medicare Other

## 2020-04-23 ENCOUNTER — Other Ambulatory Visit: Payer: Self-pay

## 2020-04-23 VITALS — BP 128/68 | HR 72 | Resp 16 | Ht 62.5 in | Wt 221.0 lb

## 2020-04-23 DIAGNOSIS — R319 Hematuria, unspecified: Secondary | ICD-10-CM | POA: Diagnosis not present

## 2020-04-23 DIAGNOSIS — N390 Urinary tract infection, site not specified: Secondary | ICD-10-CM

## 2020-04-23 NOTE — Progress Notes (Signed)
Patient in office for a repeat urine culture. Patient states that she is feeling better and has completed the course of antibiotics. Clean catch urine collected and sent to the lab for resulting. Advised patient that someone will contact her with final results. Patient verbalizes understanding and is agreeable. Routing chart to provider and closing encounter.

## 2020-04-26 LAB — URINE CULTURE

## 2020-04-27 ENCOUNTER — Other Ambulatory Visit: Payer: Self-pay

## 2020-04-27 MED ORDER — NITROFURANTOIN MONOHYD MACRO 100 MG PO CAPS: 100.0000 mg | ORAL_CAPSULE | Freq: Two times a day (BID) | ORAL | 0 refills | Status: DC

## 2020-05-07 ENCOUNTER — Ambulatory Visit: Payer: Medicare Other

## 2020-05-07 ENCOUNTER — Other Ambulatory Visit: Payer: Self-pay

## 2020-05-07 VITALS — BP 128/68 | HR 72 | Resp 12 | Ht 62.0 in | Wt 221.0 lb

## 2020-05-07 DIAGNOSIS — N390 Urinary tract infection, site not specified: Secondary | ICD-10-CM

## 2020-05-07 NOTE — Addendum Note (Signed)
Addended by: York Cerise C on: 05/07/2020 10:27 AM   Modules accepted: Orders

## 2020-05-07 NOTE — Progress Notes (Signed)
Pt said that she feels that her symptoms have resolved. She said that she is feeling much better.   Urine sent for culture.   York Cerise, CMA

## 2020-05-08 LAB — URINE CULTURE

## 2020-09-20 ENCOUNTER — Telehealth (HOSPITAL_BASED_OUTPATIENT_CLINIC_OR_DEPARTMENT_OTHER): Payer: Self-pay | Admitting: Obstetrics & Gynecology

## 2020-09-20 DIAGNOSIS — R32 Unspecified urinary incontinence: Secondary | ICD-10-CM

## 2020-09-20 DIAGNOSIS — N3281 Overactive bladder: Secondary | ICD-10-CM

## 2020-09-20 DIAGNOSIS — N8110 Cystocele, unspecified: Secondary | ICD-10-CM

## 2020-09-20 NOTE — Telephone Encounter (Signed)
Patient called and would like  a referral to see to Dr.Schroder at [facility] .

## 2020-09-21 NOTE — Telephone Encounter (Signed)
Referral placed.  Will let patient know. Sharrie Rothman CMA

## 2020-10-13 ENCOUNTER — Other Ambulatory Visit: Payer: Self-pay

## 2020-10-13 DIAGNOSIS — R6889 Other general symptoms and signs: Secondary | ICD-10-CM

## 2020-11-01 ENCOUNTER — Ambulatory Visit (INDEPENDENT_AMBULATORY_CARE_PROVIDER_SITE_OTHER): Payer: Medicare Other | Admitting: Physician Assistant

## 2020-11-01 ENCOUNTER — Other Ambulatory Visit: Payer: Self-pay

## 2020-11-01 ENCOUNTER — Ambulatory Visit (HOSPITAL_COMMUNITY)
Admission: RE | Admit: 2020-11-01 | Discharge: 2020-11-01 | Disposition: A | Discharge status: Home / Self Care | Payer: Medicare Other | Source: Ambulatory Visit | Attending: Vascular Surgery | Admitting: Vascular Surgery

## 2020-11-01 VITALS — BP 138/78 | HR 73 | Temp 97.6°F | Resp 20 | Ht 62.0 in | Wt 222.5 lb

## 2020-11-01 DIAGNOSIS — R6889 Other general symptoms and signs: Secondary | ICD-10-CM | POA: Diagnosis present

## 2020-11-01 NOTE — Progress Notes (Signed)
Office Note     CC: Abnormal home screening ABIs Requesting Provider:  Gaynelle Arabian, MD  HPI: Angelica Davis is a 80 y.o. (12-25-1940) female who presents with report of abnormal ABIs.  Her insurance company requires home physical examination and ABIs were performed.  She complains of bilateral knee pain with walking or exercise.  She denies claudication or rest pain.  She has no history of cardiovascular disease.  No prior history of stroke or TIA.  No prior vascular procedures.  She is not diabetic.  She does not have hypertension.  She states she has had elevated lipids in the past but her current total cholesterol is 200.  She does not use tobacco products.   Past Medical History:  Diagnosis Date  . Arthritis   . Colitis   . Diverticulosis   . History of blood transfusion    after birth of child  . Hx of colonic polyps - adenomas 08/16/2002  . Hyperlipidemia   . Skin cancer    skin     Past Surgical History:  Procedure Laterality Date  . CATARACT EXTRACTION Right 01/13/2019  . COLONOSCOPY  last 05/12/2014   polyps removed  . KNEE ARTHROSCOPY Left 12/08   Dr. Noemi Chapel  . TOTAL KNEE ARTHROPLASTY Left 6/10   Dr. Noemi Chapel    Social History   Socioeconomic History  . Marital status: Divorced    Spouse name: Not on file  . Number of children: Not on file  . Years of education: Not on file  . Highest education level: Not on file  Occupational History  . Not on file  Tobacco Use  . Smoking status: Never Smoker  . Smokeless tobacco: Never Used  Vaping Use  . Vaping Use: Never used  Substance and Sexual Activity  . Alcohol use: No  . Drug use: No  . Sexual activity: Not Currently    Birth control/protection: Post-menopausal  Other Topics Concern  . Not on file  Social History Narrative  . Not on file   Social Determinants of Health   Financial Resource Strain: Not on file  Food Insecurity: Not on file  Transportation Needs: Not on file  Physical Activity: Not  on file  Stress: Not on file  Social Connections: Not on file  Intimate Partner Violence: Not on file   Family History  Problem Relation Age of Onset  . Colon cancer Mother 32  . Breast cancer Paternal Aunt   . Other Father        rectal polyps  . COPD Father   . Colon polyps Father   . Esophageal cancer Neg Hx   . Rectal cancer Neg Hx   . Stomach cancer Neg Hx     Current Outpatient Medications  Medication Sig Dispense Refill  . Ascorbic Acid (VITAMIN C PO) Take by mouth.    Marland Kitchen CALCIUM PO Take by mouth.    Marland Kitchen TART CHERRY PO Take by mouth.    Marland Kitchen VITAMIN D PO Take by mouth.    . nitrofurantoin, macrocrystal-monohydrate, (MACROBID) 100 MG capsule Take 1 capsule (100 mg total) by mouth 2 (two) times daily. (Patient not taking: Reported on 11/01/2020) 10 capsule 0   No current facility-administered medications for this visit.    No Known Allergies   REVIEW OF SYSTEMS:   [X]  denotes positive finding, [ ]  denotes negative finding Cardiac  Comments:  Chest pain or chest pressure:    Shortness of breath upon exertion:    Short of  breath when lying flat:    Irregular heart rhythm:        Vascular    Pain in calf, thigh, or hip brought on by ambulation:    Pain in feet at night that wakes you up from your sleep:     Blood clot in your veins:    Leg swelling:         Pulmonary    Oxygen at home:    Productive cough:     Wheezing:         Neurologic    Sudden weakness in arms or legs:     Sudden numbness in arms or legs:     Sudden onset of difficulty speaking or slurred speech:    Temporary loss of vision in one eye:     Problems with dizziness:         Gastrointestinal    Blood in stool:     Vomited blood:         Genitourinary    Burning when urinating:     Blood in urine:        Psychiatric    Major depression:         Hematologic    Bleeding problems:    Problems with blood clotting too easily:        Skin    Rashes or ulcers:        Constitutional     Fever or chills:      PHYSICAL EXAMINATION:  Vitals:   11/01/20 1148  BP: 138/78  Pulse: 73  Resp: 20  Temp: 97.6 F (36.4 C)  TempSrc: Temporal  SpO2: 99%  Weight: 222 lb 8 oz (100.9 kg)  Height: 5\' 2"  (1.575 m)    General:  WDWN in NAD; vital signs documented above Gait: Unaided, no ataxia HENT: WNL, normocephalic Pulmonary: normal non-labored breathing , without Rales, rhonchi,  wheezing Cardiac: regular HR, without  Murmurs without carotid bruit Abdomen: soft, NT, no masses Skin: without rashes Vascular Exam/Pulses: Plus bilateral brachial, radial, femoral and dorsalis pedis pulses Extremities: without ischemic changes, without Gangrene , without cellulitis; without open wounds; reticular veins noted of both ankles and feet.  Feet are warm with intact motor function and sensation. Musculoskeletal: no muscle wasting or atrophy  Neurologic: A&O X 3;  No focal weakness or paresthesias are detected Psychiatric:  The pt has Normal affect.   Non-Invasive Vascular Imaging:   11/01/2020 ABI/TBIToday's ABIToday's TBIPrevious ABIPrevious TBI  +-------+-----------+-----------+------------+------------+  Right 1.09    0.59                  +-------+-----------+-----------+------------+------------+  Left  1.05    0.68                  +-------+-----------+-----------+------------+------------+      Summary:  Right: Resting right ankle-brachial index is within normal range. No  evidence of significant right lower extremity arterial disease. The right  toe-brachial index is abnormal. RT great toe pressure = 103 mmHg.   Left: Resting left ankle-brachial index is within normal range. No  evidence of significant left lower extremity arterial disease. The left  toe-brachial index is abnormal. LT Great toe pressure = 120 mmHg.     ASSESSMENT/PLAN:: 80 y.o. female here for lower extremity arterial evaluation due to  reported abnormal ABIs performed on home assessment.  The patient has no symptoms referable to vascular disease.  Her ABIs are normal.  She has palpable DP pulses.  Encouraged weight  loss and routine walking/exercise program.  We also discussed signs and symptoms of stroke/TIA and advised to call EMS. Follow-up as needed.  Barbie Banner, PA-C [facility] [phone]  Clinic MD:   Dr. Oneida Alar

## 2020-11-16 ENCOUNTER — Ambulatory Visit: Payer: Medicare Other | Admitting: Obstetrics and Gynecology

## 2021-01-25 ENCOUNTER — Ambulatory Visit: Payer: Medicare Other | Admitting: Obstetrics and Gynecology

## 2021-02-07 ENCOUNTER — Telehealth: Payer: Self-pay

## 2021-02-07 NOTE — Telephone Encounter (Signed)
Angelica Davis is a 80 y.o. female was called and contacted re: New pt Pre appt call to collect history information. Pt verified using 2 identifiers. Confirmation that I am speaking with the correct person.  Chart was updated: -Allergy -Medication -Confirm pharmacy -OB history  Pt was notified to arrive 15 min early and we will need a urine sample when she arrives. Pt verbalized understanding.

## 2021-02-08 ENCOUNTER — Encounter: Payer: Self-pay | Admitting: Obstetrics and Gynecology

## 2021-02-08 ENCOUNTER — Other Ambulatory Visit: Payer: Self-pay

## 2021-02-08 ENCOUNTER — Ambulatory Visit (INDEPENDENT_AMBULATORY_CARE_PROVIDER_SITE_OTHER): Payer: Medicare Other | Admitting: Obstetrics and Gynecology

## 2021-02-08 VITALS — BP 151/84 | HR 79 | Ht 62.5 in | Wt 222.0 lb

## 2021-02-08 DIAGNOSIS — R350 Frequency of micturition: Secondary | ICD-10-CM

## 2021-02-08 DIAGNOSIS — N3281 Overactive bladder: Secondary | ICD-10-CM

## 2021-02-08 DIAGNOSIS — R159 Full incontinence of feces: Secondary | ICD-10-CM | POA: Diagnosis not present

## 2021-02-08 DIAGNOSIS — N393 Stress incontinence (female) (male): Secondary | ICD-10-CM | POA: Diagnosis not present

## 2021-02-08 LAB — POCT URINALYSIS DIPSTICK
Appearance: NORMAL
Bilirubin, UA: NEGATIVE
Blood, UA: NEGATIVE
Glucose, UA: NEGATIVE
Ketones, UA: NEGATIVE
Protein, UA: NEGATIVE
Spec Grav, UA: 1.025 (ref 1.010–1.025)
Urobilinogen, UA: 0.2 E.U./dL
pH, UA: 5.5 (ref 5.0–8.0)

## 2021-02-08 NOTE — Patient Instructions (Addendum)
Stop drinking at least 2 hours before.   Accidental Bowel Leakage: Our goal is to achieve formed bowel movements daily or every-other-day without leakage.  You may need to try different combinations of the following options to find what works best for you.  Some management options include: Dietary changes (more leafy greens, vegetables and fruits; less processed foods) Fiber supplementation (Metamucil or something with psyllium as active ingredient) Over-the-counter imodium (tablets or liquid) to help solidify the stool and prevent leakage of stool.

## 2021-02-08 NOTE — Progress Notes (Signed)
[facility] New Patient Evaluation and Consultation  Referring Provider: Megan Salon, MD PCP: Gaynelle Arabian, MD Date of Service: 02/08/2021  SUBJECTIVE Chief Complaint: New Patient (Initial Visit) (Dr. Sabra Heck referral for incontinence)  History of Present Illness: Angelica Davis is a 80 y.o. White or Caucasian female seen in consultation at the request of Dr. Sabra Heck for evaluation of incontinence.    Review of records from Dr Sabra Heck significant for: Has mixed incontinence, improved with myrbetriq.   Urinary Symptoms: Leaks urine with cough/ sneeze, laughing, exercise, lifting, with a full bladder, with movement to the bathroom, with urgency, and while asleep. UUI > SUI symptoms. SUI is more rare.  Leaks many time(s) per day.  Pad use: 2-3 pads per day.   She is bothered by her UI symptoms. She took Myrbetriq and it helped but had some side effects of abdominal pain.   Day time voids 3-4.  Nocturia: 3-4 times per night to void. Voiding dysfunction: she empties her bladder well.  does not use a catheter to empty bladder.  When urinating, she feels a weak stream and dribbling after finishing Drinks: 4-16oz bottles water only, rare tea or soda. Drinks up until bedtime.   UTIs:  0  UTI's in the last year.   Denies history of blood in urine and kidney or bladder stones  Pelvic Organ Prolapse Symptoms:                  She Denies a feeling of a bulge the vaginal area.   Bowel Symptom: Bowel movements: varies how often Stool consistency: soft  or loose Straining: no.  Splinting: no.  Incomplete evacuation: no.  She Admits to accidental bowel leakage / fecal incontinence  Occurs: sometimes- usually with spicy foods  Consistency with leakage: soft  or liquid Bowel regimen: none Last colonoscopy: Date 05/2019, Results polyps removed  Sexual Function Sexually active: no.   Pelvic Pain Denies pelvic pain    Past Medical History:  Past Medical History:   Diagnosis Date   Arthritis    Colitis    Diverticulosis    History of blood transfusion    after birth of child   Hx of colonic polyps - adenomas 08/16/2002   Hyperlipidemia    Skin cancer    skin      Past Surgical History:   Past Surgical History:  Procedure Laterality Date   CATARACT EXTRACTION Right 01/13/2019   COLONOSCOPY  last 05/12/2014   polyps removed   KNEE ARTHROSCOPY Left 12/08   Dr. Noemi Chapel   TOTAL KNEE ARTHROPLASTY Left 6/10   Dr. Noemi Chapel     Past OB/GYN History: OB History     Gravida  3   Para  3   Term      Preterm      AB      Living  3      SAB      IAB      Ectopic      Multiple      Live Births              Menopausal: Yes, Denies vaginal bleeding since menopause Last pap smear was 2018.  Any history of abnormal pap smears: no.   Medications: She has a current medication list which includes the following prescription(s): ascorbic acid, calcium, tart cherry, and vitamin d.   Allergies: Patient has No Known Allergies.   Social History:  Social History   Tobacco Use  Smoking status: Never   Smokeless tobacco: Never  Vaping Use   Vaping Use: Never used  Substance Use Topics   Alcohol use: No   Drug use: No    Family History:   Family History  Problem Relation Age of Onset   Colon cancer Mother 63   Breast cancer Paternal Aunt    Other Father        rectal polyps   COPD Father    Colon polyps Father    Esophageal cancer Neg Hx    Rectal cancer Neg Hx    Stomach cancer Neg Hx      Review of Systems: Review of Systems  Constitutional:  Negative for fever, malaise/fatigue and weight loss.  Respiratory:  Negative for cough, shortness of breath and wheezing.   Cardiovascular:  Negative for chest pain, palpitations and leg swelling.  Gastrointestinal:  Negative for abdominal pain and blood in stool.  Genitourinary:  Negative for dysuria.  Musculoskeletal:  Negative for myalgias.  Skin:  Negative for rash.   Neurological:  Negative for dizziness and headaches.  Endo/Heme/Allergies:  Does not bruise/bleed easily.  Psychiatric/Behavioral:  Negative for depression. The patient is not nervous/anxious.     OBJECTIVE Physical Exam: Vitals:   02/08/21 1442  BP: (!) 151/84  Pulse: 79  Weight: 222 lb (100.7 kg)  Height: 5' 2.5" (1.588 m)    Physical Exam Constitutional:      General: She is not in acute distress. Pulmonary:     Effort: Pulmonary effort is normal.  Abdominal:     General: There is no distension.     Palpations: Abdomen is soft.     Tenderness: There is no abdominal tenderness. There is no rebound.  Musculoskeletal:        General: No swelling. Normal range of motion.  Skin:    General: Skin is warm and dry.     Findings: No rash.  Neurological:     Mental Status: She is alert and oriented to person, place, and time.  Psychiatric:        Mood and Affect: Mood normal.        Behavior: Behavior normal.     GU / Detailed Urogynecologic Evaluation:  Pelvic Exam: Normal external female genitalia; Bartholin's and Skene's glands normal in appearance; urethral meatus normal in appearance, no urethral masses or discharge.   CST: negative  Speculum exam reveals normal vaginal mucosa with atrophy. Cervix normal appearance. Uterus normal single, nontender. Adnexa no mass, fullness, tenderness.     Pelvic floor strength 0/V- unable to squeeze pelvic floor, puborectalis II/V external anal sphincter II/V  Pelvic floor musculature: Right levator non-tender, Right obturator non-tender, Left levator non-tender, Left obturator non-tender  POP-Q:   POP-Q  -2                                            Aa   -2                                           Ba  -6  C   3                                            Gh  4                                            Pb  10                                            tvl   -3                                             Ap  -3                                            Bp  -9.5                                              D     Rectal Exam:  Normal sphincter tone, no distal rectocele, enterocoele not present, no rectal masses, no sign of dyssynergia when asking the patient to bear down.  Post-Void Residual (PVR) by Bladder Scan: In order to evaluate bladder emptying, we discussed obtaining a postvoid residual and she agreed to this procedure.  Procedure: The ultrasound unit was placed on the patient's abdomen in the suprapubic region after the patient had voided. A PVR of 5 ml was obtained by bladder scan.  Laboratory Results: POC urine: trace leukocytes   ASSESSMENT AND PLAN Ms. Wiggers is a 80 y.o. with:  1. Overactive bladder   2. Urinary frequency   3. Incontinence of feces, unspecified fecal incontinence type   4. SUI (stress urinary incontinence, female)    OAB -We discussed the symptoms of overactive bladder (OAB), which include urinary urgency, urinary frequency, nocturia, with or without urge incontinence.  While we do not know the exact etiology of OAB, several treatment options exist. We discussed management including behavioral therapy (decreasing bladder irritants, urge suppression strategies, timed voids, bladder retraining), physical therapy, medication; for refractory cases posterior tibial nerve stimulation, sacral neuromodulation, and intravesical botulinum toxin injection.  - She is not interested in being on medication at this time. She is potentially interested in PTNS- handout provided. She will also consider physical therapy.   2. Fecal incontinence - Treatment options include anti-diarrhea medication (loperamide/ Imodium OTC or prescription lomotil), fiber supplements, physical therapy, and possible sacral neuromodulation or surgery.   - She will add fiber supplement and imodium as needed  3. SUI - rare, does not feel is bothersome  She  will notify us once she decides on treatment.   Jaquita Folds, MD   Medical Decision Making:  - Reviewed/ ordered a clinical laboratory test - Review and summation of prior records

## 2021-08-06 DIAGNOSIS — M1711 Unilateral primary osteoarthritis, right knee: Secondary | ICD-10-CM | POA: Diagnosis present

## 2021-08-06 NOTE — Progress Notes (Signed)
Sent message, via epic in basket, requesting orders in epic from surgeon.  

## 2021-08-15 NOTE — Patient Instructions (Signed)
DUE TO COVID-19 ONLY ONE VISITOR IS ALLOWED TO COME WITH YOU AND STAY IN THE WAITING ROOM ONLY DURING PRE OP AND PROCEDURE.   **NO VISITORS ARE ALLOWED IN THE SHORT STAY AREA OR RECOVERY ROOM!!**  IF YOU WILL BE ADMITTED INTO THE HOSPITAL YOU ARE ALLOWED ONLY TWO SUPPORT PEOPLE DURING VISITATION HOURS ONLY (7 AM -8PM)   The support person(s) must pass our screening, gel in and out, and wear a mask at all times, including in the patients room. Patients must also wear a mask when staff or their support person are in the room. Visitors GUEST BADGE MUST BE WORN VISIBLY  One adult visitor may remain with you overnight and MUST be in the room by 8 P.M.  No visitors under the age of 63. Any visitor under the age of 73 must be accompanied by an adult.        Your procedure is scheduled on: 08/27/21   Report to [facility] Main Entrance    Report to Short stay at: 5:15 AM   Call this number if you have problems the morning of surgery [phone]   Do not eat food :After Midnight.   May have liquids until: 4:30 AM    day of surgery  CLEAR LIQUID DIET  Foods Allowed                                                                     Foods Excluded  Water, Black Coffee and tea, regular and decaf                             liquids that you cannot  Plain Jell-O in any flavor  (No red)                                           see through such as: Fruit ices (not with fruit pulp)                                     milk, soups, orange juice              Iced Popsicles (No red)                                    All solid food                                   Apple juices Sports drinks like Gatorade (No red) Lightly seasoned clear broth or consume(fat free) Sugar Sample Menu Breakfast                                Lunch  Supper Cranberry juice                    Beef broth                            Chicken broth Jell-O                                      Grape juice                           Apple juice Coffee or tea                        Jell-O                                      Popsicle                                                Coffee or tea                        Coffee or tea   Complete one Ensure drink the morning of surgery 3 hours prior to scheduled surgery at 4:30 AM.    The day of surgery:  Drink ONE (1) Pre-Surgery Clear Ensure or G2 at AM the morning of surgery. Drink in one sitting. Do not sip.  This drink was given to you during your hospital  pre-op appointment visit. Nothing else to drink after completing the  Pre-Surgery Clear Ensure or G2.          If you have questions, please contact your surgeons office.    Oral Hygiene is also important to reduce your risk of infection.                                    Remember - BRUSH YOUR TEETH THE MORNING OF SURGERY WITH YOUR REGULAR TOOTHPASTE   Do NOT smoke after Midnight   Take these medicines the morning of surgery with A SIP OF WATER: N/A  DO NOT TAKE ANY ORAL DIABETIC MEDICATIONS DAY OF YOUR SURGERY                              You may not have any metal on your body including hair pins, jewelry, and body piercing             Do not wear make-up, lotions, powders, perfumes/cologne, or deodorant  Do not wear nail polish including gel and S&S, artificial/acrylic nails, or any other type of covering on natural nails including finger and toenails. If you have artificial nails, gel coating, etc. that needs to be removed by a nail salon please have this removed prior to surgery or surgery may need to be canceled/ delayed if the surgeon/ anesthesia feels like they are unable to be safely monitored.   Do not shave  48 hours prior to surgery.  Do not bring valuables to the hospital. [place].   Contacts, dentures or bridgework may not be worn into surgery.   Bring small overnight bag day of  surgery.    Patients discharged on the day of surgery will not be allowed to drive home.  Someone needs to stay with you for the first 24 hours after anesthesia.   Special Instructions: Bring a copy of your healthcare power of attorney and living will documents         the day of surgery if you haven't scanned them before.              Please read over the following fact sheets you were given: IF YOU HAVE QUESTIONS ABOUT YOUR PRE-OP INSTRUCTIONS PLEASE CALL [phone]     [facility] - Preparing for Surgery Before surgery, you can play an important role.  Because skin is not sterile, your skin needs to be as free of germs as possible.  You can reduce the number of germs on your skin by washing with CHG (chlorahexidine gluconate) soap before surgery.  CHG is an antiseptic cleaner which kills germs and bonds with the skin to continue killing germs even after washing. Please DO NOT use if you have an allergy to CHG or antibacterial soaps.  If your skin becomes reddened/irritated stop using the CHG and inform your nurse when you arrive at Short Stay. Do not shave (including legs and underarms) for at least 48 hours prior to the first CHG shower.  You may shave your face/neck. Please follow these instructions carefully:  1.  Shower with CHG Soap the night before surgery and the  morning of Surgery.  2.  If you choose to wash your hair, wash your hair first as usual with your  normal  shampoo.  3.  After you shampoo, rinse your hair and body thoroughly to remove the  shampoo.                           4.  Use CHG as you would any other liquid soap.  You can apply chg directly  to the skin and wash                       Gently with a scrungie or clean washcloth.  5.  Apply the CHG Soap to your body ONLY FROM THE NECK DOWN.   Do not use on face/ open                           Wound or open sores. Avoid contact with eyes, ears mouth and genitals (private parts).                       Wash face,  Genitals  (private parts) with your normal soap.             6.  Wash thoroughly, paying special attention to the area where your surgery  will be performed.  7.  Thoroughly rinse your body with warm water from the neck down.  8.  DO NOT shower/wash with your normal soap after using and rinsing off  the CHG Soap.                9.  Pat yourself  dry with a clean towel.            10.  Wear clean pajamas.            11.  Place clean sheets on your bed the night of your first shower and do not  sleep with pets. Day of Surgery : Do not apply any lotions/deodorants the morning of surgery.  Please wear clean clothes to the hospital/surgery center.  FAILURE TO FOLLOW THESE INSTRUCTIONS MAY RESULT IN THE CANCELLATION OF YOUR SURGERY PATIENT SIGNATURE_________________________________  NURSE SIGNATURE__________________________________  ________________________________________________________________________   Adam Phenix  An incentive spirometer is a tool that can help keep your lungs clear and active. This tool measures how well you are filling your lungs with each breath. Taking long deep breaths may help reverse or decrease the chance of developing breathing (pulmonary) problems (especially infection) following: A long period of time when you are unable to move or be active. BEFORE THE PROCEDURE  If the spirometer includes an indicator to show your best effort, your nurse or respiratory therapist will set it to a desired goal. If possible, sit up straight or lean slightly forward. Try not to slouch. Hold the incentive spirometer in an upright position. INSTRUCTIONS FOR USE  Sit on the edge of your bed if possible, or sit up as far as you can in bed or on a chair. Hold the incentive spirometer in an upright position. Breathe out normally. Place the mouthpiece in your mouth and seal your lips tightly around it. Breathe in slowly and as deeply as possible, raising the piston or the ball toward the  top of the column. Hold your breath for 3-5 seconds or for as long as possible. Allow the piston or ball to fall to the bottom of the column. Remove the mouthpiece from your mouth and breathe out normally. Rest for a few seconds and repeat Steps 1 through 7 at least 10 times every 1-2 hours when you are awake. Take your time and take a few normal breaths between deep breaths. The spirometer may include an indicator to show your best effort. Use the indicator as a goal to work toward during each repetition. After each set of 10 deep breaths, practice coughing to be sure your lungs are clear. If you have an incision (the cut made at the time of surgery), support your incision when coughing by placing a pillow or rolled up towels firmly against it. Once you are able to get out of bed, walk around indoors and cough well. You may stop using the incentive spirometer when instructed by your caregiver.  RISKS AND COMPLICATIONS Take your time so you do not get dizzy or light-headed. If you are in pain, you may need to take or ask for pain medication before doing incentive spirometry. It is harder to take a deep breath if you are having pain. AFTER USE Rest and breathe slowly and easily. It can be helpful to keep track of a log of your progress. Your caregiver can provide you with a simple table to help with this. If you are using the spirometer at home, follow these instructions: [place] IF:  You are having difficultly using the spirometer. You have trouble using the spirometer as often as instructed. Your pain medication is not giving enough relief while using the spirometer. You develop fever of 100.5 F (38.1 C) or higher. SEEK IMMEDIATE MEDICAL CARE IF:  You cough up bloody sputum that had not been present before. You  develop fever of 102 F (38.9 C) or greater. You develop worsening pain at or near the incision site. MAKE SURE YOU:  Understand these instructions. Will watch your  condition. Will get help right away if you are not doing well or get worse. Document Released: 11/03/2006 Document Revised: 09/15/2011 Document Reviewed: 01/04/2007 [facility] Patient Information 2014 [place], [place].   ________________________________________________________________________

## 2021-08-16 ENCOUNTER — Encounter (HOSPITAL_COMMUNITY)
Admission: RE | Admit: 2021-08-16 | Discharge: 2021-08-16 | Disposition: A | Discharge status: Home / Self Care | Payer: Medicare Other | Source: Ambulatory Visit | Attending: Orthopedic Surgery | Admitting: Orthopedic Surgery

## 2021-08-16 ENCOUNTER — Other Ambulatory Visit: Payer: Self-pay

## 2021-08-16 ENCOUNTER — Encounter (HOSPITAL_COMMUNITY): Payer: Self-pay

## 2021-08-16 VITALS — BP 150/69 | HR 80 | Temp 97.6°F | Ht 62.5 in | Wt 209.0 lb

## 2021-08-16 DIAGNOSIS — Z01812 Encounter for preprocedural laboratory examination: Secondary | ICD-10-CM | POA: Insufficient documentation

## 2021-08-16 DIAGNOSIS — Z01818 Encounter for other preprocedural examination: Secondary | ICD-10-CM

## 2021-08-16 LAB — CBC
HCT: 38.2 % (ref 36.0–46.0)
Hemoglobin: 12.2 g/dL (ref 12.0–15.0)
MCH: 28.1 pg (ref 26.0–34.0)
MCHC: 31.9 g/dL (ref 30.0–36.0)
MCV: 88 fL (ref 80.0–100.0)
Platelets: 221 10*3/uL (ref 150–400)
RBC: 4.34 MIL/uL (ref 3.87–5.11)
RDW: 12.8 % (ref 11.5–15.5)
WBC: 6.6 10*3/uL (ref 4.0–10.5)
nRBC: 0 % (ref 0.0–0.2)

## 2021-08-16 LAB — SURGICAL PCR SCREEN
MRSA, PCR: NEGATIVE
Staphylococcus aureus: NEGATIVE

## 2021-08-16 NOTE — Progress Notes (Signed)
COVID Vaccine Completed: Yes Date COVID Vaccine completed: 2021 x 2 COVID vaccine manufacturer:   Moderna    COVID Test: N/A Bowel prep reminder: N/A  PCP - Dr. Wynne Dust Cardiologist -   Chest x-ray -  EKG -  Stress Test -  ECHO -  Cardiac Cath -  Pacemaker/ICD device last checked:  Sleep Study -  CPAP -   Fasting Blood Sugar -  Checks Blood Sugar _____ times a day  Blood Thinner Instructions: Aspirin Instructions: Last Dose:  Anesthesia review:   Patient denies shortness of breath, fever, cough and chest pain at PAT appointment   Patient verbalized understanding of instructions that were given to them at the PAT appointment. Patient was also instructed that they will need to review over the PAT instructions again at home before surgery.

## 2021-08-21 NOTE — Progress Notes (Signed)
Preoperative testing

## 2021-08-26 NOTE — H&P (Signed)
KNEE ARTHROPLASTY ADMISSION H&P  Patient ID: Angelica Davis MRN: [id number] DOB/AGE: 1940/10/05 81 y.o.  Chief Complaint: right knee pain.  Planned Procedure Date: 08/27/21 Medical Clearance by Dr. Marisue Humble   HPI: Angelica Davis is a 81 y.o. female who presents for evaluation of right knee DJD. The patient has a history of pain and functional disability in the right knee due to arthritis and has failed non-surgical conservative treatments for greater than 12 weeks to include NSAID's and/or analgesics, corticosteriod injections, and activity modification.  Onset of symptoms was gradual, starting 1 years ago with gradually worsening course since that time. The patient noted no past surgery on the right knee.  Patient currently rates pain at 2 out of 10 with activity. Patient has worsening of pain with activity and weight bearing and pain that interferes with activities of daily living.  Patient has evidence of joint space narrowing by imaging studies.  There is no active infection.  Past Medical History:  Diagnosis Date   Arthritis    Colitis    Diverticulosis    History of blood transfusion    after birth of child   Hx of colonic polyps - adenomas 08/16/2002   Hyperlipidemia    Skin cancer    skin    Past Surgical History:  Procedure Laterality Date   CATARACT EXTRACTION Right 01/13/2019   COLONOSCOPY  last 05/12/2014   polyps removed   KNEE ARTHROSCOPY Left 06/2007   Dr. Noemi Chapel   TONSILLECTOMY     TOTAL KNEE ARTHROPLASTY Left 12/2008   Dr. Noemi Chapel   Allergies  Allergen Reactions   Fenofibrate Other (See Comments)    Other reaction(s): Leg aches   Lovastatin Other (See Comments)    Other reaction(s): Leg aches   Pravastatin Other (See Comments)    Other reaction(s): Leg aches   Prior to Admission medications   Medication Sig Start Date End Date Taking? Authorizing Provider  Ascorbic Acid (VITAMIN C PO) Take 1 tablet by mouth 2 (two) times daily.   Yes [provider]  naproxen sodium (ALEVE) 220 MG tablet Take 440 mg by mouth daily as needed (Pain).   Yes [provider]  TART CHERRY PO Take by mouth.   Yes [provider]  VITAMIN D PO Take 1 capsule by mouth daily.   Yes [provider]   Social History   Socioeconomic History   Marital status: Divorced    Spouse name: Not on file   Number of children: Not on file   Years of education: Not on file   Highest education level: Not on file  Occupational History   Not on file  Tobacco Use   Smoking status: Never   Smokeless tobacco: Never  Vaping Use   Vaping Use: Never used  Substance and Sexual Activity   Alcohol use: No   Drug use: No   Sexual activity: Not Currently    Birth control/protection: Post-menopausal  Other Topics Concern   Not on file  Social History Narrative   Not on file   Social Determinants of Health   Financial Resource Strain: Not on file  Food Insecurity: Not on file  Transportation Needs: Not on file  Physical Activity: Not on file  Stress: Not on file  Social Connections: Not on file   Family History  Problem Relation Age of Onset   Colon cancer Mother 54   Breast cancer Paternal Aunt    Other Father  rectal polyps   COPD Father    Colon polyps Father    Esophageal cancer Neg Hx    Rectal cancer Neg Hx    Stomach cancer Neg Hx     ROS: Currently denies lightheadedness, dizziness, Fever, chills, CP, SOB.   No personal history of DVT, PE, MI, or CVA. No loose teeth or dentures All other systems have been reviewed and were otherwise currently negative with the exception of those mentioned in the HPI and as above.  Objective: Vitals: Ht: 5' 3.5" Wt: 210 lbs Temp: 98 BP: 137/79 Pulse: 90 O2 97% on room air.   Physical Exam: General: Alert, NAD.  Antalgic Gait  HEENT: EOMI, Good Neck Extension  Pulm: No increased work of breathing.  Clear B/L A/P w/o crackle or wheeze.  CV: RRR, No m/g/r appreciated  GI: soft, NT,  ND Neuro: Neuro without gross focal deficit.  Sensation intact distally Skin: No lesions in the area of chief complaint MSK/Surgical Site: righ knee w/o redness or effusion.  medial JLT. ROM 0-120.  5/5 strength in extension and flexion.  +EHL/FHL.  NVI.  Stable varus and valgus stress.    Imaging Review Plain radiographs demonstrate severe degenerative joint disease of the right knee.   The overall alignment issignificant varus. The bone quality appears to be adequate for age and reported activity level.  Preoperative templating of the joint replacement has been completed, documented, and submitted to the Operating Room personnel in order to optimize intra-operative equipment management.  Assessment: right knee DJD Principal Problem:   Osteoarthritis of right knee   Plan: Plan for Procedure(s): UNICOMPARTMENTAL KNEE  The patient history, physical exam, clinical judgement of the provider and imaging are consistent with end stage degenerative joint disease and partial joint arthroplasty is deemed medically necessary. The treatment options including medical management, injection therapy, and arthroplasty were discussed at length. The risks and benefits of Procedure(s): UNICOMPARTMENTAL KNEE were presented and reviewed.  The risks of nonoperative treatment, versus surgical intervention including but not limited to continued pain, aseptic loosening, stiffness, dislocation/subluxation, infection, bleeding, nerve injury, blood clots, cardiopulmonary complications, morbidity, mortality, among others were discussed. The patient verbalizes understanding and wishes to proceed with the plan.  Patient is being admitted for inpatient treatment for surgery, pain control, PT, prophylactic antibiotics, VTE prophylaxis, progressive ambulation, ADL's and discharge planning.   Dental prophylaxis discussed and recommended for 2 years postoperatively.  The patient does  meet the criteria for TXA which will  be used perioperatively.   ASA 325 mg will be used postoperatively for DVT prophylaxis in addition to SCDs, and early ambulation. The patient is planning to be discharged home with OPPT in care of family   Jola Baptist 08/26/2021 6:22 PM

## 2021-08-26 NOTE — Anesthesia Preprocedure Evaluation (Addendum)
Anesthesia Evaluation  Patient identified by MRN, date of birth, ID band Patient awake    Reviewed: Allergy & Precautions, NPO status , Patient's Chart, lab work & pertinent test results  History of Anesthesia Complications Negative for: history of anesthetic complications  Airway Mallampati: II  TM Distance: >3 FB Neck ROM: Full    Dental no notable dental hx. (+) Dental Advisory Given   Pulmonary neg pulmonary ROS,    Pulmonary exam normal        Cardiovascular negative cardio ROS Normal cardiovascular exam     Neuro/Psych negative neurological ROS     GI/Hepatic negative GI ROS, Neg liver ROS,   Endo/Other  negative endocrine ROS  Renal/GU negative Renal ROS     Musculoskeletal  (+) Arthritis , Osteoarthritis,    Abdominal   Peds  Hematology negative hematology ROS (+)   Anesthesia Other Findings   Reproductive/Obstetrics                            Anesthesia Physical Anesthesia Plan  ASA: 2  Anesthesia Plan: Spinal and MAC   Post-op Pain Management: Regional block*, Celebrex PO (pre-op)* and Tylenol PO (pre-op)*   Induction: Intravenous  PONV Risk Score and Plan: 2 and Ondansetron, Dexamethasone and Propofol infusion  Airway Management Planned: Natural Airway and Simple Face Mask  Additional Equipment:   Intra-op Plan:   Post-operative Plan:   Informed Consent: I have reviewed the patients History and Physical, chart, labs and discussed the procedure including the risks, benefits and alternatives for the proposed anesthesia with the patient or authorized representative who has indicated his/her understanding and acceptance.     Dental advisory given  Plan Discussed with: Anesthesiologist and CRNA  Anesthesia Plan Comments:        Anesthesia Quick Evaluation

## 2021-08-27 ENCOUNTER — Ambulatory Visit (HOSPITAL_COMMUNITY)
Admission: RE | Admit: 2021-08-27 | Discharge: 2021-08-27 | Disposition: A | Discharge status: Home / Self Care | Payer: Medicare Other | Source: Ambulatory Visit | Attending: Orthopedic Surgery | Admitting: Orthopedic Surgery

## 2021-08-27 ENCOUNTER — Other Ambulatory Visit: Payer: Self-pay

## 2021-08-27 ENCOUNTER — Encounter (HOSPITAL_COMMUNITY)
Admission: RE | Disposition: A | Discharge status: Home / Self Care | Payer: Self-pay | Source: Ambulatory Visit | Attending: Orthopedic Surgery

## 2021-08-27 ENCOUNTER — Ambulatory Visit (HOSPITAL_COMMUNITY): Payer: Medicare Other

## 2021-08-27 ENCOUNTER — Ambulatory Visit (HOSPITAL_BASED_OUTPATIENT_CLINIC_OR_DEPARTMENT_OTHER): Payer: Medicare Other | Admitting: Anesthesiology

## 2021-08-27 ENCOUNTER — Encounter (HOSPITAL_COMMUNITY): Payer: Self-pay | Admitting: Orthopedic Surgery

## 2021-08-27 ENCOUNTER — Ambulatory Visit (HOSPITAL_COMMUNITY): Payer: Medicare Other | Admitting: Anesthesiology

## 2021-08-27 DIAGNOSIS — Z96651 Presence of right artificial knee joint: Secondary | ICD-10-CM

## 2021-08-27 DIAGNOSIS — M1711 Unilateral primary osteoarthritis, right knee: Secondary | ICD-10-CM | POA: Diagnosis present

## 2021-08-27 DIAGNOSIS — R2689 Other abnormalities of gait and mobility: Secondary | ICD-10-CM | POA: Diagnosis not present

## 2021-08-27 HISTORY — PX: PARTIAL KNEE ARTHROPLASTY: SHX2174

## 2021-08-27 SURGERY — ARTHROPLASTY, KNEE, UNICOMPARTMENTAL
Anesthesia: Monitor Anesthesia Care | Site: Knee | Laterality: Right

## 2021-08-27 MED ORDER — FENTANYL CITRATE (PF) 100 MCG/2ML IJ SOLN
INTRAMUSCULAR | Status: AC
  Filled 2021-08-27: qty 2

## 2021-08-27 MED ORDER — ONDANSETRON HCL 4 MG PO TABS: 4.0000 mg | ORAL_TABLET | Freq: Three times a day (TID) | ORAL | 0 refills | Status: DC | PRN

## 2021-08-27 MED ORDER — PROPOFOL 500 MG/50ML IV EMUL
INTRAVENOUS | Status: AC
  Filled 2021-08-27: qty 50

## 2021-08-27 MED ORDER — CEFAZOLIN SODIUM-DEXTROSE 2-4 GM/100ML-% IV SOLN
2.0000 g | INTRAVENOUS | Status: AC
  Administered 2021-08-27: 2 g via INTRAVENOUS
  Filled 2021-08-27: qty 100

## 2021-08-27 MED ORDER — ASPIRIN EC 325 MG PO TBEC: 325.0000 mg | DELAYED_RELEASE_TABLET | Freq: Two times a day (BID) | ORAL | 0 refills | Status: DC

## 2021-08-27 MED ORDER — OXYCODONE HCL 5 MG PO TABS: 5.0000 mg | ORAL_TABLET | ORAL | Status: DC | PRN

## 2021-08-27 MED ORDER — LACTATED RINGERS IV BOLUS
500.0000 mL | Freq: Once | INTRAVENOUS | Status: AC
  Administered 2021-08-27: 500 mL via INTRAVENOUS

## 2021-08-27 MED ORDER — ACETAMINOPHEN 325 MG PO TABS: 325.0000 mg | ORAL_TABLET | Freq: Four times a day (QID) | ORAL | Status: DC | PRN

## 2021-08-27 MED ORDER — ONDANSETRON HCL 4 MG/2ML IJ SOLN
INTRAMUSCULAR | Status: AC
  Filled 2021-08-27: qty 2

## 2021-08-27 MED ORDER — ACETAMINOPHEN 500 MG PO TABS
1000.0000 mg | ORAL_TABLET | Freq: Four times a day (QID) | ORAL | Status: DC
  Administered 2021-08-27: 1000 mg via ORAL

## 2021-08-27 MED ORDER — HYDROMORPHONE HCL 1 MG/ML IJ SOLN: 0.5000 mg | INTRAMUSCULAR | Status: DC | PRN

## 2021-08-27 MED ORDER — SENNA-DOCUSATE SODIUM 8.6-50 MG PO TABS: 2.0000 | ORAL_TABLET | Freq: Every day | ORAL | 1 refills | Status: DC

## 2021-08-27 MED ORDER — METHOCARBAMOL 500 MG PO TABS: 500.0000 mg | ORAL_TABLET | Freq: Four times a day (QID) | ORAL | Status: DC | PRN

## 2021-08-27 MED ORDER — ONDANSETRON HCL 4 MG PO TABS
4.0000 mg | ORAL_TABLET | Freq: Four times a day (QID) | ORAL | Status: DC | PRN
  Filled 2021-08-27: qty 1

## 2021-08-27 MED ORDER — ONDANSETRON HCL 4 MG/2ML IJ SOLN: 4.0000 mg | Freq: Four times a day (QID) | INTRAMUSCULAR | Status: DC | PRN

## 2021-08-27 MED ORDER — EPHEDRINE 5 MG/ML INJ
INTRAVENOUS | Status: AC
  Filled 2021-08-27: qty 5

## 2021-08-27 MED ORDER — MIDAZOLAM HCL 2 MG/2ML IJ SOLN
INTRAMUSCULAR | Status: AC
  Filled 2021-08-27: qty 2

## 2021-08-27 MED ORDER — TRANEXAMIC ACID-NACL 1000-0.7 MG/100ML-% IV SOLN: 1000.0000 mg | Freq: Once | INTRAVENOUS | Status: DC

## 2021-08-27 MED ORDER — BUPIVACAINE HCL 0.25 % IJ SOLN
INTRAMUSCULAR | Status: DC | PRN
  Administered 2021-08-27: 30 mL

## 2021-08-27 MED ORDER — LACTATED RINGERS IV BOLUS
250.0000 mL | Freq: Once | INTRAVENOUS | Status: AC
  Administered 2021-08-27: 250 mL via INTRAVENOUS

## 2021-08-27 MED ORDER — ROPIVACAINE HCL 7.5 MG/ML IJ SOLN
INTRAMUSCULAR | Status: DC | PRN
  Administered 2021-08-27: 20 mL via PERINEURAL

## 2021-08-27 MED ORDER — TRANEXAMIC ACID-NACL 1000-0.7 MG/100ML-% IV SOLN
1000.0000 mg | INTRAVENOUS | Status: AC
  Administered 2021-08-27: 1000 mg via INTRAVENOUS
  Filled 2021-08-27: qty 100

## 2021-08-27 MED ORDER — POVIDONE-IODINE 10 % EX SWAB
2.0000 "application " | Freq: Once | CUTANEOUS | Status: AC
  Administered 2021-08-27: 2 via TOPICAL

## 2021-08-27 MED ORDER — METOCLOPRAMIDE HCL 5 MG/ML IJ SOLN: 5.0000 mg | Freq: Three times a day (TID) | INTRAMUSCULAR | Status: DC | PRN

## 2021-08-27 MED ORDER — BUPIVACAINE HCL 0.25 % IJ SOLN
INTRAMUSCULAR | Status: AC
  Filled 2021-08-27: qty 1

## 2021-08-27 MED ORDER — FENTANYL CITRATE (PF) 100 MCG/2ML IJ SOLN
INTRAMUSCULAR | Status: DC | PRN
Start: 2021-08-27 — End: 2021-08-27
  Administered 2021-08-27: 50 ug via INTRAVENOUS

## 2021-08-27 MED ORDER — METHOCARBAMOL 500 MG IVPB - SIMPLE MED: 500.0000 mg | Freq: Four times a day (QID) | INTRAVENOUS | Status: DC | PRN

## 2021-08-27 MED ORDER — PROPOFOL 500 MG/50ML IV EMUL
INTRAVENOUS | Status: DC | PRN
  Administered 2021-08-27: 100 ug/kg/min via INTRAVENOUS

## 2021-08-27 MED ORDER — POVIDONE-IODINE 10 % EX SWAB: 2.0000 "application " | Freq: Once | CUTANEOUS | Status: DC

## 2021-08-27 MED ORDER — MIDAZOLAM HCL 2 MG/2ML IJ SOLN
INTRAMUSCULAR | Status: DC | PRN
  Administered 2021-08-27: 1 mg via INTRAVENOUS

## 2021-08-27 MED ORDER — STERILE WATER FOR IRRIGATION IR SOLN
Status: DC | PRN
  Administered 2021-08-27: 1000 mL

## 2021-08-27 MED ORDER — ONDANSETRON HCL 4 MG/2ML IJ SOLN
INTRAMUSCULAR | Status: DC | PRN
  Administered 2021-08-27: 4 mg via INTRAVENOUS

## 2021-08-27 MED ORDER — ORAL CARE MOUTH RINSE: 15.0000 mL | Freq: Once | OROMUCOSAL | Status: AC

## 2021-08-27 MED ORDER — 0.9 % SODIUM CHLORIDE (POUR BTL) OPTIME
TOPICAL | Status: DC | PRN
  Administered 2021-08-27: 1000 mL

## 2021-08-27 MED ORDER — CEFAZOLIN SODIUM-DEXTROSE 2-4 GM/100ML-% IV SOLN: 2.0000 g | Freq: Four times a day (QID) | INTRAVENOUS | Status: DC

## 2021-08-27 MED ORDER — KETOROLAC TROMETHAMINE 30 MG/ML IJ SOLN
INTRAMUSCULAR | Status: DC | PRN
  Administered 2021-08-27: 30 mg

## 2021-08-27 MED ORDER — KETOROLAC TROMETHAMINE 30 MG/ML IJ SOLN
INTRAMUSCULAR | Status: AC
  Filled 2021-08-27: qty 1

## 2021-08-27 MED ORDER — PROPOFOL 10 MG/ML IV BOLUS
INTRAVENOUS | Status: AC
  Filled 2021-08-27: qty 20

## 2021-08-27 MED ORDER — ACETAMINOPHEN 500 MG PO TABS
ORAL_TABLET | ORAL | Status: AC
  Filled 2021-08-27: qty 2

## 2021-08-27 MED ORDER — CHLORHEXIDINE GLUCONATE 0.12 % MT SOLN
15.0000 mL | Freq: Once | OROMUCOSAL | Status: AC
  Administered 2021-08-27: 15 mL via OROMUCOSAL

## 2021-08-27 MED ORDER — LACTATED RINGERS IV SOLN: INTRAVENOUS | Status: DC

## 2021-08-27 MED ORDER — OXYCODONE HCL 5 MG PO TABS: 10.0000 mg | ORAL_TABLET | ORAL | Status: DC | PRN

## 2021-08-27 MED ORDER — PROPOFOL 10 MG/ML IV BOLUS
INTRAVENOUS | Status: DC | PRN
  Administered 2021-08-27 (×2): 10 mg via INTRAVENOUS

## 2021-08-27 MED ORDER — PROPOFOL 1000 MG/100ML IV EMUL
INTRAVENOUS | Status: AC
  Filled 2021-08-27: qty 100

## 2021-08-27 MED ORDER — METOCLOPRAMIDE HCL 5 MG PO TABS
5.0000 mg | ORAL_TABLET | Freq: Three times a day (TID) | ORAL | Status: DC | PRN
  Filled 2021-08-27: qty 2

## 2021-08-27 MED ORDER — EPHEDRINE SULFATE-NACL 50-0.9 MG/10ML-% IV SOSY
PREFILLED_SYRINGE | INTRAVENOUS | Status: DC | PRN
Start: 2021-08-27 — End: 2021-08-27
  Administered 2021-08-27: 10 mg via INTRAVENOUS

## 2021-08-27 MED ORDER — OXYCODONE HCL 5 MG PO TABS
5.0000 mg | ORAL_TABLET | ORAL | 0 refills | Status: DC | PRN
Start: 2021-08-27 — End: 2022-06-24

## 2021-08-27 MED ORDER — ACETAMINOPHEN 500 MG PO TABS
1000.0000 mg | ORAL_TABLET | Freq: Once | ORAL | Status: AC
  Administered 2021-08-27: 1000 mg via ORAL
  Filled 2021-08-27: qty 2

## 2021-08-27 MED ORDER — POVIDONE-IODINE 7.5 % EX SOLN: Freq: Once | CUTANEOUS | Status: DC

## 2021-08-27 MED ORDER — DEXAMETHASONE SODIUM PHOSPHATE 10 MG/ML IJ SOLN
INTRAMUSCULAR | Status: DC | PRN
  Administered 2021-08-27: 5 mg

## 2021-08-27 SURGICAL SUPPLY — 65 items
BAG COUNTER SPONGE SURGICOUNT (BAG) IMPLANT
BAG ZIPLOCK 12X15 (MISCELLANEOUS) ×2 IMPLANT
BANDAGE ESMARK 6X9 LF (GAUZE/BANDAGES/DRESSINGS) ×1 IMPLANT
BEARING ANATOMIC 5 (Orthopedic Implant) ×1 IMPLANT
BIT DRILL QUICK REL 1/8 2PK SL (DRILL) IMPLANT
BLADE SURG 15 STRL LF DISP TIS (BLADE) ×1 IMPLANT
BLADE SURG 15 STRL SS (BLADE) ×2
BNDG ELASTIC 6X15 VLCR STRL LF (GAUZE/BANDAGES/DRESSINGS) ×2 IMPLANT
BNDG ESMARK 6X9 LF (GAUZE/BANDAGES/DRESSINGS) ×2
BOWL SMART MIX CTS (DISPOSABLE) ×2 IMPLANT
CEMENT BONE R 1X40 (Cement) ×2 IMPLANT
CLSR STERI-STRIP ANTIMIC 1/2X4 (GAUZE/BANDAGES/DRESSINGS) ×2 IMPLANT
COVER SURGICAL LIGHT HANDLE (MISCELLANEOUS) ×2 IMPLANT
CUFF TOURN SGL QUICK 34 (TOURNIQUET CUFF) ×2
CUFF TRNQT CYL 34X4.125X (TOURNIQUET CUFF) ×1 IMPLANT
DRAPE EXTREMITY T 121X128X90 (DISPOSABLE) ×2 IMPLANT
DRAPE POUCH INSTRU U-SHP 10X18 (DRAPES) ×2 IMPLANT
DRAPE SHEET LG 3/4 BI-LAMINATE (DRAPES) ×2 IMPLANT
DRAPE U-SHAPE 47X51 STRL (DRAPES) ×2 IMPLANT
DRILL QUICK RELEASE 1/8 INCH (DRILL) ×2
DRSG MEPILEX BORDER 4X12 (GAUZE/BANDAGES/DRESSINGS) ×1 IMPLANT
DRSG MEPILEX BORDER 4X8 (GAUZE/BANDAGES/DRESSINGS) ×2 IMPLANT
DRSG PAD ABDOMINAL 8X10 ST (GAUZE/BANDAGES/DRESSINGS) ×2 IMPLANT
DURAPREP 26ML APPLICATOR (WOUND CARE) ×4 IMPLANT
ELECT REM PT RETURN 15FT ADLT (MISCELLANEOUS) ×2 IMPLANT
FACESHIELD WRAPAROUND (MASK) ×2 IMPLANT
FACESHIELD WRAPAROUND OR TEAM (MASK) ×1 IMPLANT
GLOVE SRG 8 PF TXTR STRL LF DI (GLOVE) ×1 IMPLANT
GLOVE SURG ENC MOIS LTX SZ7 (GLOVE) ×2 IMPLANT
GLOVE SURG POLYISO LF SZ7.5 (GLOVE) ×2 IMPLANT
GLOVE SURG UNDER POLY LF SZ7 (GLOVE) ×2 IMPLANT
GLOVE SURG UNDER POLY LF SZ8 (GLOVE) ×2
GOWN STRL REUS W/TWL LRG LVL3 (GOWN DISPOSABLE) ×4 IMPLANT
HANDPIECE INTERPULSE COAX TIP (DISPOSABLE) ×2
HOLDER FOLEY CATH W/STRAP (MISCELLANEOUS) IMPLANT
HOOD PEEL AWAY FLYTE STAYCOOL (MISCELLANEOUS) ×4 IMPLANT
IMMOBILIZER KNEE 20 (SOFTGOODS)
IMMOBILIZER KNEE 20 THIGH 36 (SOFTGOODS) IMPLANT
IMMOBILIZER KNEE 22 UNIV (SOFTGOODS) ×1 IMPLANT
KIT BASIN OR (CUSTOM PROCEDURE TRAY) ×2 IMPLANT
KIT TURNOVER KIT A (KITS) IMPLANT
NDL SAFETY ECLIPSE 18X1.5 (NEEDLE) ×1 IMPLANT
NEEDLE HYPO 18GX1.5 SHARP (NEEDLE) ×2
NS IRRIG 1000ML POUR BTL (IV SOLUTION) ×2 IMPLANT
PACK BLADE SAW RECIP 70 3 PT (BLADE) ×1 IMPLANT
PACK TOTAL JOINT (CUSTOM PROCEDURE TRAY) ×2 IMPLANT
PEG TWIN FEM CEMENTED MED (Knees) ×1 IMPLANT
PROTECTOR NERVE ULNAR (MISCELLANEOUS) ×2 IMPLANT
SET HNDPC FAN SPRY TIP SCT (DISPOSABLE) ×1 IMPLANT
SPIKE FLUID TRANSFER (MISCELLANEOUS) IMPLANT
SUCTION FRAZIER HANDLE 12FR (TUBING) ×2
SUCTION TUBE FRAZIER 12FR DISP (TUBING) ×1 IMPLANT
SUT VIC AB 1 CT1 36 (SUTURE) ×2 IMPLANT
SUT VIC AB 2-0 CT1 27 (SUTURE) ×2
SUT VIC AB 2-0 CT1 TAPERPNT 27 (SUTURE) ×1 IMPLANT
SUT VIC AB 3-0 SH 8-18 (SUTURE) ×2 IMPLANT
SYR 30ML LL (SYRINGE) ×2 IMPLANT
SYR 3ML LL SCALE MARK (SYRINGE) ×2 IMPLANT
TOWEL OR 17X26 10 PK STRL BLUE (TOWEL DISPOSABLE) ×2 IMPLANT
TOWEL OR NON WOVEN STRL DISP B (DISPOSABLE) ×2 IMPLANT
TRAY FOLEY MTR SLVR 16FR STAT (SET/KITS/TRAYS/PACK) ×2 IMPLANT
TRAY TIBIAL OXFORD SZ C RT (Joint) ×1 IMPLANT
TUBE SUCTION HIGH CAP CLEAR NV (SUCTIONS) ×2 IMPLANT
WATER STERILE IRR 1000ML POUR (IV SOLUTION) ×2 IMPLANT
WRAP KNEE MAXI GEL POST OP (GAUZE/BANDAGES/DRESSINGS) ×2 IMPLANT

## 2021-08-27 NOTE — Discharge Instructions (Signed)

## 2021-08-27 NOTE — Anesthesia Procedure Notes (Signed)
Procedure Name: MAC Date/Time: 08/27/2021 7:17 AM Performed by: Niel Hummer, CRNA Pre-anesthesia Checklist: Patient identified, Emergency Drugs available, Suction available and Patient being monitored Oxygen Delivery Method: Simple face mask

## 2021-08-27 NOTE — Anesthesia Procedure Notes (Signed)
Anesthesia Regional Block: Adductor canal block   Pre-Anesthetic Checklist: , timeout performed,  Correct Patient, Correct Site, Correct Laterality,  Correct Procedure, Correct Position, site marked,  Risks and benefits discussed,  Surgical consent,  Pre-op evaluation,  At surgeon's request and post-op pain management  Laterality: Right  Prep: chloraprep       Needles:  Injection technique: Single-shot  Needle Type: Stimulator Needle - 80     Needle Length: 10cm  Needle Gauge: 21     Additional Needles:   Narrative:  Start time: 08/27/2021 6:47 AM End time: 08/27/2021 6:57 AM Injection made incrementally with aspirations every 5 mL.  Performed by: Personally  Anesthesiologist: Duane Boston, MD

## 2021-08-27 NOTE — Op Note (Signed)
08/27/2021  9:23 AM  PATIENT:  Angelica Davis    PRE-OPERATIVE DIAGNOSIS: Right knee anteromedial osteoarthritis  POST-OPERATIVE DIAGNOSIS:  Same  PROCEDURE:  Unicompartmental Knee Arthroplasty  SURGEON:  [name], MD  PHYSICIAN ASSISTANT: Merlene Pulling, PA-C, present and scrubbed throughout the case, critical for completion in a timely fashion, and for retraction, instrumentation, and closure.  ANESTHESIA:   Spinal  ESTIMATED BLOOD LOSS: 75 mL  UNIQUE ASPECTS OF THE CASE: It was a little difficult to position her leg in the legholder because of body habitus.  The femoral component was slightly internally rotated, but tracked well, and the medial compartment had severe eburnation.  It was fairly difficult to ream.  I had to clean the fluids multiple times.  I cut off the 2 mm shim, and it was a fairly thin cut, but she still sized to either a 5, or maybe even a 6 although it was a tight 6, and the 5 felt appropriate.  PREOPERATIVE INDICATIONS:  Angelica Davis is a  81 y.o. female with a diagnosis of right knee DJD who failed conservative measures and elected for surgical management.    The risks benefits and alternatives were discussed with the patient preoperatively including but not limited to the risks of infection, bleeding, nerve injury, cardiopulmonary complications, blood clots, the need for revision surgery, among others, and the patient was willing to proceed.  OPERATIVE IMPLANTS: Bi[employer] [place] mobile bearing medial compartment arthroplasty femur size medium, tibia size C, bearing size 5.  I was surprised she was a medium, because she was only 5 foot 2, but it did not seem like she had substantial osteophytes posteriorly, and the small did not reconstitute the chondral depth.  OPERATIVE FINDINGS: Endstage grade 4 medial compartment osteoarthritis. No significant changes in the lateral or patellofemoral joint.  The ACL was intact.  OPERATIVE PROCEDURE: The patient was  brought to the operating room placed in the supine position. Anesthesia was administered. IV antibiotics were given. The lower extremity was placed in the legholder and prepped and draped in usual sterile fashion.  Time out was performed.  The leg was elevated and exsanguinated and the tourniquet was inflated. Anteromedial incision was performed, and I took care to preserve the MCL. Parapatellar incision was carried out, and the osteophytes were excised, along with the medial meniscus and a small portion of the fat pad.  The extra medullary tibial cutting jig was applied, using the spoon and the 40mm G-Clamp and the 2 mm shim, and I took care to protect the anterior cruciate ligament insertion and the tibial spine. The medial collateral ligament was also protected, and I resected my proximal tibia, matching the anatomic slope.   The proximal tibial bony cut was removed in one piece, and I turned my attention to the femur.  The intramedullary femoral rod was placed using the drill, and then using the appropriate reference, I assembled the femoral jig, setting my posterior cutting block. I resected my posterior femur, used the 0 spigot for the anterior femur, and then measured my gap.   I then used the appropriate mill to match the extension gap to the flexion gap. The second milling was at a 5 and then again at 6.  The gaps were then measured again with the appropriate feeler gauges. Once I had balanced flexion and extension gaps, I then completed the preparation of the femur.  I milled off the anterior aspect of the distal femur to prevent impingement. I also  exposed the tibia, and selected the above-named component, and then used the cutting jig to prepare the keel slot on the tibia. I also used the awl to curette out the bone to complete the preparation of the keel. The back wall was intact.  I then placed trial components, and it was found to have excellent motion, and appropriate balance.  I  then cemented the components into place, cementing the tibia first, removing all excess cement, and then cementing the femur.  All loose cement was removed.  The real polyethylene insert was applied manually, and the knee was taken through functional range of motion, and found to have excellent stability and restoration of joint motion, with excellent balance.  The wounds were irrigated copiously, and the parapatellar tissue closed with Vicryl, followed by Vicryl for the subcutaneous tissue, with routine closure with Steri-Strips and sterile gauze.  The tourniquet was released, and the patient was awakened and extubated and returned to PACU in stable and satisfactory condition. There were no complications.

## 2021-08-27 NOTE — Evaluation (Signed)
Physical Therapy Evaluation Patient Details Name: Angelica Davis MRN: [id number] DOB: 09-10-1940 Today's Date: 08/27/2021  History of Present Illness  81 yo female s/p R UKA 08/27/21. Hx of L TKA 2010  Clinical Impression  On eval POD 0, pt required Min A for mobility. She walked ~75 feet with a RW. Minimal pain reported during session. Bladder incontinence during session-pt reports she feels numb around genital/buttocks area still. Ambulated without KI during session-no overt buckling but some unsteadiness observed so instructed pt to wear KI for ambulation safety on today (and/or until numbness fully goes away). Issued HEP for pt to perform 1x/day and encouraged her to try to ambulate every hour as tolerated. All education completed.      Recommendations for follow up therapy are one component of a multi-disciplinary discharge planning process, led by the attending physician.  Recommendations may be updated based on patient status, additional functional criteria and insurance authorization.  Follow Up Recommendations Follow physician's recommendations for discharge plan and follow up therapies (plan is for OPPT)    Assistance Recommended at Discharge Frequent or constant Supervision/Assistance  Patient can return home with the following  A little help with walking and/or transfers;A little help with bathing/dressing/bathroom;Assistance with cooking/housework;Assist for transportation;Help with stairs or ramp for entrance    Equipment Recommendations Rolling walker (2 wheels)  Recommendations for Other Services       Functional Status Assessment Patient has had a recent decline in their functional status and demonstrates the ability to make significant improvements in function in a reasonable and predictable amount of time.     Precautions / Restrictions Precautions Precautions: Fall;Knee Required Braces or Orthoses: Knee Immobilizer - Right Knee Immobilizer - Right: Discontinue once  straight leg raise with < 10 degree lag Restrictions Weight Bearing Restrictions: No Other Position/Activity Restrictions: WBAT      Mobility  Bed Mobility Overal bed mobility: Needs Assistance Bed Mobility: Supine to Sit     Supine to sit: Supervision, HOB elevated     General bed mobility comments: Supv for safety. Pt reported mild lightheadedness. BP WNL.    Transfers Overall transfer level: Needs assistance Equipment used: Rolling walker (2 wheels) Transfers: Sit to/from Stand, Bed to chair/wheelchair/BSC Sit to Stand: Min assist   Step pivot transfers: Min assist       General transfer comment: Cues for safety, technique, sequence. Assist to stabilize. Pt reports R leg feels "like a wet rag." No overt buckling but some unsteadiness.    Ambulation/Gait Ambulation/Gait assistance: Min assist Gait Distance (Feet): 75 Feet Assistive device: Rolling walker (2 wheels) Gait Pattern/deviations: Step-to pattern       General Gait Details: Cues for safety, technique, sequence. Assist to steady throughout distance. Followed with recliner for safety. No overt knee buckling but some unsteadiness.  Stairs Stairs:  (N/A-pt has ramp that she will use)          Wheelchair Mobility    Modified Rankin (Stroke Patients Only)       Balance Overall balance assessment: Needs assistance         Standing balance support: Bilateral upper extremity supported, During functional activity, Reliant on assistive device for balance Standing balance-Leahy Scale: Poor                               Pertinent Vitals/Pain Pain Assessment Pain Assessment: Faces Faces Pain Scale: Hurts little more Pain Location: R knee Pain Descriptors / Indicators: Discomfort,  Sore Pain Intervention(s): Monitored during session, Repositioned    Home Living Family/patient expects to be discharged to:: Private residence Living Arrangements: Alone Available Help at Discharge:  Family Type of Home: House Home Access: Stairs to enter;Ramped entrance       [place]: One level [place]: Kasandra Knudsen - single point      Prior Function Prior Level of Function : Independent/Modified Independent                     Hand Dominance        Extremity/Trunk Assessment   Upper Extremity Assessment Upper Extremity Assessment: Overall WFL for tasks assessed    Lower Extremity Assessment Lower Extremity Assessment: Generalized weakness (able to SLR)    Cervical / Trunk Assessment Cervical / Trunk Assessment: Normal  Communication   Communication: No difficulties  Cognition Arousal/Alertness: Awake/alert Behavior During Therapy: WFL for tasks assessed/performed Overall Cognitive Status: Within Functional Limits for tasks assessed                                          General Comments      Exercises Total Joint Exercises Ankle Circles/Pumps: AROM, Both, 10 reps Quad Sets: AROM, Both, 10 reps Hip ABduction/ADduction: AAROM, Right, 10 reps Straight Leg Raises: AAROM, Right, 10 reps   Assessment/Plan    PT Assessment Patient needs continued PT services  PT Problem List Decreased strength;Decreased range of motion;Decreased mobility;Decreased balance;Decreased activity tolerance;Decreased knowledge of use of DME;Pain       PT Treatment Interventions DME instruction;Gait training;Therapeutic exercise;Balance training;Stair training;Functional mobility training;Therapeutic activities;Patient/family education    PT Goals (Current goals can be found in the Care Plan section)  Acute Rehab PT Goals Patient Stated Goal: regain PLOF/independence PT Goal Formulation: All assessment and education complete, DC therapy    Frequency 7X/week     Co-evaluation               AM-PAC PT "6 Clicks" Mobility  Outcome Measure Help needed turning from your back to your side while in a flat bed without using bedrails?: A  Little Help needed moving from lying on your back to sitting on the side of a flat bed without using bedrails?: A Little Help needed moving to and from a bed to a chair (including a wheelchair)?: A Little Help needed standing up from a chair using your arms (e.g., wheelchair or bedside chair)?: A Little Help needed to walk in hospital room?: A Little Help needed climbing 3-5 steps with a railing? : A Little 6 Click Score: 18    End of Session Equipment Utilized During Treatment: Gait belt Activity Tolerance: Patient tolerated treatment well Patient left: in chair;with call bell/phone within reach;with family/visitor present   PT Visit Diagnosis: Pain;Other abnormalities of gait and mobility (R26.89);Difficulty in walking, not elsewhere classified (R26.2) Pain - Right/Left: Right Pain - part of body: Knee    Time: 1226-1303 PT Time Calculation (min) (ACUTE ONLY): 37 min   Charges:   PT Evaluation $PT Eval Low Complexity: 1 Low PT Treatments $Gait Training: 8-22 mins         Doreatha Massed, PT Acute Rehabilitation  Office: [phone] Pager: [phone]

## 2021-08-27 NOTE — Anesthesia Procedure Notes (Signed)
Spinal  Patient location during procedure: OR Start time: 08/27/2021 7:15 AM End time: 08/27/2021 7:24 AM Reason for block: surgical anesthesia Staffing Performed: anesthesiologist  Anesthesiologist: Duane Boston, MD Preanesthetic Checklist Completed: patient identified, IV checked, risks and benefits discussed, surgical consent, monitors and equipment checked, pre-op evaluation and timeout performed Spinal Block Patient position: sitting Prep: DuraPrep Patient monitoring: cardiac monitor, continuous pulse ox and blood pressure Approach: right paramedian Location: L2-3 Injection technique: single-shot Needle Needle type: Pencan  Needle gauge: 22 G Needle length: 9 cm Assessment Events: CSF return Additional Notes Functioning IV was confirmed and monitors were applied. Sterile prep and drape, including hand hygiene and sterile gloves were used. The patient was positioned and the spine was prepped. The skin was anesthetized with lidocaine.  Free flow of clear CSF was obtained prior to injecting local anesthetic into the CSF.  The spinal needle aspirated freely following injection.  The needle was carefully withdrawn.  The patient tolerated the procedure well. Difficult placement.

## 2021-08-27 NOTE — Anesthesia Postprocedure Evaluation (Signed)
Anesthesia Post Note  Patient: Angelica Davis  Procedure(s) Performed: UNICOMPARTMENTAL KNEE (Right: Knee)     Patient location during evaluation: PACU Anesthesia Type: MAC Level of consciousness: awake and alert Pain management: pain level controlled Vital Signs Assessment: post-procedure vital signs reviewed and stable Respiratory status: spontaneous breathing and respiratory function stable Cardiovascular status: blood pressure returned to baseline and stable Postop Assessment: spinal receding Anesthetic complications: no   No notable events documented.  Last Vitals:  Vitals:   08/27/21 1130 08/27/21 1145  BP: 128/76 139/82  Pulse: 74 67  Resp:    Temp:    SpO2: 99% 98%    Last Pain:  Vitals:   08/27/21 1145  TempSrc:   PainSc: 0-No pain                 Laquesha Holcomb DANIEL

## 2021-08-27 NOTE — Transfer of Care (Signed)
Immediate Anesthesia Transfer of Care Note  Patient: Angelica Davis  Procedure(s) Performed: UNICOMPARTMENTAL KNEE (Right: Knee)  Patient Location: PACU  Anesthesia Type:Spinal  Level of Consciousness: drowsy  Airway & Oxygen Therapy: Patient Spontanous Breathing and Patient connected to face mask oxygen  Post-op Assessment: Report given to RN and Post -op Vital signs reviewed and stable  Post vital signs: Reviewed and stable  Last Vitals:  Vitals Value Taken Time  BP 132/67   Temp    Pulse 68 08/27/21 0948  Resp    SpO2 100 % 08/27/21 0948  Vitals shown include unvalidated device data.  Last Pain:  Vitals:   08/27/21 0545  TempSrc: Oral  PainSc:          Complications: No notable events documented.

## 2021-08-27 NOTE — Interval H&P Note (Signed)
History and Physical Interval Note:  08/27/2021 6:56 AM  Angelica Davis  has presented today for surgery, with the diagnosis of right knee DJD.  The various methods of treatment have been discussed with the patient and family. After consideration of risks, benefits and other options for treatment, the patient has consented to  Procedure(s): UNICOMPARTMENTAL KNEE (Right) as a surgical intervention.  The patient's history has been reviewed, patient examined, no change in status, stable for surgery.  I have reviewed the patient's chart and labs.  Questions were answered to the patient's satisfaction.     [name]

## 2021-08-28 ENCOUNTER — Encounter (HOSPITAL_COMMUNITY): Payer: Self-pay | Admitting: Orthopedic Surgery

## 2021-09-02 ENCOUNTER — Encounter: Payer: Self-pay | Admitting: Physical Therapy

## 2021-09-02 ENCOUNTER — Ambulatory Visit: Payer: Medicare Other | Attending: Orthopedic Surgery | Admitting: Physical Therapy

## 2021-09-02 ENCOUNTER — Other Ambulatory Visit: Payer: Self-pay

## 2021-09-02 DIAGNOSIS — M25561 Pain in right knee: Secondary | ICD-10-CM | POA: Insufficient documentation

## 2021-09-02 DIAGNOSIS — M25661 Stiffness of right knee, not elsewhere classified: Secondary | ICD-10-CM | POA: Diagnosis not present

## 2021-09-02 DIAGNOSIS — R2689 Other abnormalities of gait and mobility: Secondary | ICD-10-CM | POA: Diagnosis present

## 2021-09-02 DIAGNOSIS — M6281 Muscle weakness (generalized): Secondary | ICD-10-CM | POA: Insufficient documentation

## 2021-09-02 DIAGNOSIS — R600 Localized edema: Secondary | ICD-10-CM | POA: Insufficient documentation

## 2021-09-02 NOTE — Therapy (Signed)
[facility] [address]  [place], [place], [address] Phone: [phone]   Fax:  [phone]  Physical Therapy Evaluation  Patient Details  Name: Angelica Davis MRN: [id number] Date of Birth: [birth year] Referring Provider (PT): Marchia Bond, MD   Encounter Date: 09/02/2021   PT End of Session - 09/02/21 0846     Visit Number 1    Number of Visits 12    Date for PT Re-Evaluation 10/14/21    Authorization Type UHC Medicare    PT Start Time 0846    PT Stop Time 0940    PT Time Calculation (min) 54 min    Activity Tolerance Patient tolerated treatment well    Behavior During Therapy [name] for tasks assessed/performed             Past Medical History:  Diagnosis Date   Arthritis    Colitis    Diverticulosis    History of blood transfusion    after birth of child   Hx of colonic polyps - adenomas 08/16/2002   Hyperlipidemia    Skin cancer    skin     Past Surgical History:  Procedure Laterality Date   CATARACT EXTRACTION Right 01/13/2019   COLONOSCOPY  last 05/12/2014   polyps removed   KNEE ARTHROSCOPY Left 06/2007   Dr. Noemi Chapel   PARTIAL KNEE ARTHROPLASTY Right 08/27/2021   Procedure: UNICOMPARTMENTAL KNEE;  Surgeon: Marchia Bond, MD;  Location: [facility];  Service: Orthopedics;  Laterality: Right;   TONSILLECTOMY     TOTAL KNEE ARTHROPLASTY Left 12/2008   Dr. Noemi Chapel    There were no vitals filed for this visit.    Subjective Assessment - 09/02/21 0853     Subjective Pt reports she has been able to manage her pain with just Tylenol. Pain mostly at night - typically a burning or throbbing feeling in her medial or posterior knee up to 4-5/10. Walks with cane or RW and sometimes takes off w/o either. Biggest difficulty is bending down - unable to get compression hose on w/o assistance and limited with taking care of her dog. Main issue before surgery was limited standing tolerance but has not really  tried standing for long peroids since surgery.    Pertinent History R unicompartmental knee replacement 08/27/21, L TKA 12/2008    Patient Stated Goals "To be able to get back to what I'm used to doing w/o pain"    Currently in Pain? No/denies                [facility] PT Assessment - 09/02/21 0846       Assessment   Medical Diagnosis R unicompartmental knee replacement    Referring Provider (PT) Marchia Bond, MD    Onset Date/Surgical Date 08/27/21    Hand Dominance Right    Next MD Visit 09/09/21    Prior Therapy h/o PT after L TKA & for L frozen shoulder      Precautions   Precautions None      Restrictions   Weight Bearing Restrictions No      Balance Screen   Has the patient fallen in the past 6 months No    Has the patient had a decrease in activity level because of a fear of falling?  No    Is the patient reluctant to leave their home because of a fear of falling?  No      Home Ecologist  residence    Living Arrangements Alone    Type of [place] to enter;Ramped entrance    [place] One level    [place] - 2 wheels;[place] - single point      Prior Function   Level of Independence Independent    Vocation Retired    Leisure cares part-time for granddaughter, visit 51 y/o mother a few times per week      Cognition   Overall Cognitive Status Within Functional Limits for tasks assessed      Observation/Other Assessments   Focus on Therapeutic Outcomes (FOTO)  Knee = 33, predicted D/C FS = 68      ROM / Strength   AROM / PROM / Strength AROM;PROM;Strength      AROM   AROM Assessment Site Knee    Right/Left Knee Right;Left    Right Knee Extension 14   in seated LAQ,   Right Knee Flexion 76    Left Knee Extension 0    Left Knee Flexion 115      PROM   PROM Assessment Site Knee    Right/Left Knee Right    Right Knee Extension 7   supported in supine     Strength   Strength Assessment Site  Hip;Knee;Ankle    Right/Left Hip Right;Left    Right Hip Flexion 4-/5    Right Hip Extension 3+/5    Right Hip ABduction 3+/5    Right Hip ADduction 3+/5    Left Hip Flexion 4+/5    Left Hip Extension 3+/5    Left Hip ABduction 4-/5    Left Hip ADduction 3+/5    Right/Left Knee Right;Left    Right Knee Flexion 4-/5    Right Knee Extension 4-/5    Left Knee Flexion 5/5    Left Knee Extension 5/5    Right/Left Ankle Right;Left    Right Ankle Dorsiflexion 4-/5    Left Ankle Dorsiflexion 4-/5      Flexibility   Soft Tissue Assessment /Muscle Length yes    Hamstrings mild/mod tight R    Quadriceps mod tight R    ITB mild/mod tight B      Ambulation/Gait   Ambulation/Gait Yes    Assistive device Straight cane    Gait Pattern Antalgic;Decreased weight shift to right;Decreased stance time - right;Decreased stride length;Lateral trunk lean to left    Ambulation Surface Level;Indoor    Gait velocity decreased    Gait Comments Pt using cane on R with cane adjusted too short - cane elevated 2 notches and instructions provided for proper sequencing with cane in L hand.                        Objective measurements completed on examination: See above findings.                PT Education - 09/02/21 0930     Education Details PT eval findings, anticipated POC, gait training with SPC, review of proper positioning in recliner to encourage knee extension, initial HEP - Access Code: [email or web address]    Person(s) Educated Patient    Methods Explanation;Demonstration;Verbal cues;Handout    Comprehension Verbalized understanding;Verbal cues required;Returned demonstration;Need further instruction              PT Short Term Goals - 09/02/21 0940       PT SHORT TERM GOAL #1   Title Patient will be independent with  initial HEP    Status New    Target Date 09/23/21      PT SHORT TERM GOAL #2   Title Patient will improve R knee flexion to >/= 90 dg to facilitate  increased ease of mobility and transitions    Status New    Target Date 09/23/21               PT Long Term Goals - 09/02/21 0940       PT LONG TERM GOAL #1   Title Patient will demonstrate independent use of ongoing/advanced HEP to facilitate ability to maintain/progress functional gains from skilled physical therapy services    Status New    Target Date 10/14/21      PT LONG TERM GOAL #2   Title Patient will demonstrate R knee AROM to >/= 2-120 dg to allow for normal gait and stair mechanics    Status New    Target Date 10/14/21      PT LONG TERM GOAL #3   Title Patient will demonstrate improved B LE strength to >/= 4/5 to 4+/5 for improved stability and ease of mobility    Status New    Target Date 10/14/21      PT LONG TERM GOAL #4   Title Patient will ambulate with normal gait pattern and good gait stability w/o AD    Status New    Target Date 10/14/21      PT LONG TERM GOAL #5   Title Patient able to ascend/descend stairs with reciprocal gait and no greater than single rail use with good mechanics    Status New    Target Date 10/14/21      PT LONG TERM GOAL #6   Title Patient to report ability to perform ADLs, household, and leisure activities without limitation due to R knee pain, LOM or weakness    Status New    Target Date 10/14/21                    Plan - 09/02/21 0940     Clinical Impression Statement Angelica Davis is a 81 y/o female who presents to OP PT 6 days s/p R unicompartmental knee replacement on 08/27/21. She reports she has been able to manage her pain with Tylenol with no pain currently. She has self-weaned from the RW to the [facility], although still uses the walker at times, but sometime will walk w/o any AD. Adjustment necessary for height of SPC and gait training initiated to correct hand usage of cane as well as sequencing of cane with R foot advancement. Deficits include intermittent include R knee pain, diffuse moderate edema and ecchymosis in  R knee and proximal/distal LE, limited R knee AROM (14-76, with 7 of extension lacking in PROM/supported extension), R>L LE weakness with quad lag evident on SLR, and limited gait tolerance with antalgic gait pattern and dependence on AD. Angelica Davis will benefit from skilled PT intervention to address the above listed deficits, reduce pain, and restore functional R knee ROM and strength to allow for improved knee stability for improved balance and gait tolerance to maximize function and safety with mobility in home and community. Reviewed recommendations for positioning of knee to reduce risk of knee flexion contracture as well as ice, elevation and hospital issued HEP exercises to promote knee ROM and reduce post-op edema as well as risk for DVT, adding stretches and AAROM to encourage increased ROM.    Personal Factors and Comorbidities Age;Comorbidity 3+;Fitness;Past/Current Experience  Comorbidities L TKA 12/2008, OA, HLD, diverticulosis    Examination-Activity Limitations Bathing;Bed Mobility;Bend;Caring for Others;Dressing;Lift;Carry;Transfers;Locomotion Level;Sleep;Squat;Stairs;Stand;Toileting    Examination-Participation Restrictions Cleaning;Community Activity;Driving;Laundry;Meal Prep;Shop;Yard Work    [employer] officer Stable/Uncomplicated    Clinical Decision Making Low    Rehab Potential Good    PT Frequency 2x / week    PT Duration 6 weeks    PT Treatment/Interventions ADLs/Self Care Home Management;Cryotherapy;Electrical Stimulation;Iontophoresis 4mg /ml Dexamethasone;Moist Heat;DME Instruction;Gait training;Stair training;Functional mobility training;Therapeutic activities;Therapeutic exercise;Balance training;Neuromuscular re-education;Patient/family education;Manual techniques;Scar mobilization;Passive range of motion;Dry needling;Energy conservation;Taping;Vasopneumatic Device;Joint Manipulations    PT Next Visit Plan R knee ROM; LE stretching and strengtheing; review of  gait training with SPC; MT and modalities PRN for pain, edema and ROM    PT Home Exercise Plan Access Code: [email or web address]    Consulted and Agree with Plan of Care Patient             Patient will benefit from skilled therapeutic intervention in order to improve the following deficits and impairments:  Abnormal gait, Decreased activity tolerance, Decreased balance, Decreased knowledge of use of DME, Decreased mobility, Decreased range of motion, Decreased safety awareness, Decreased scar mobility, Decreased strength, Difficulty walking, Increased edema, Increased fascial restricitons, Increased muscle spasms, Impaired perceived functional ability, Impaired flexibility, Improper body mechanics, Postural dysfunction, Pain  Visit Diagnosis: Stiffness of right knee, not elsewhere classified  Muscle weakness (generalized)  Acute pain of right knee  Other abnormalities of gait and mobility  Localized edema     Problem List Patient Active Problem List   Diagnosis Date Noted   S/P right unicompartmental knee replacement 08/27/2021   Osteoarthritis of right knee 08/06/2021   Pain of left hand 11/16/2019   Trigger finger of left hand 11/16/2019   Elevated lipids 09/04/2015   Hx of colonic polyps - adenomas 08/16/2002    Percival Spanish, PT 09/02/2021, 12:12 PM  [facility] [address]  [place], [place], [address] Phone: [phone]   Fax:  [phone]  Name: Angelica Davis MRN: [id number] Date of Birth: [birth year]

## 2021-09-02 NOTE — Patient Instructions (Signed)
° ° °  Access Code: [email or web address] URL: [email or web address] Date: 09/02/2021 Prepared by: Annie Paras  Exercises Supine Heel Slide with Strap - 2-3 x daily - 7 x weekly - 2 sets - 10 reps - 3 sec hold Hooklying Hamstring Stretch with Strap - 2-3 x daily - 7 x weekly - 3 reps - 30 sec hold Seated Hamstring Stretch with Strap - 2-3 x daily - 7 x weekly - 3 reps - 30 sec hold Seated Heel Slide - 2-3 x daily - 7 x weekly - 2 sets - 10 reps - 3 sec hold Seated Long Arc Quad - 2-3 x daily - 7 x weekly - 2 sets - 10 reps - 3 sec hold

## 2021-09-04 ENCOUNTER — Other Ambulatory Visit: Payer: Self-pay

## 2021-09-04 ENCOUNTER — Ambulatory Visit: Payer: Medicare Other | Attending: Orthopedic Surgery

## 2021-09-04 DIAGNOSIS — M6281 Muscle weakness (generalized): Secondary | ICD-10-CM | POA: Insufficient documentation

## 2021-09-04 DIAGNOSIS — R600 Localized edema: Secondary | ICD-10-CM | POA: Insufficient documentation

## 2021-09-04 DIAGNOSIS — M25661 Stiffness of right knee, not elsewhere classified: Secondary | ICD-10-CM | POA: Diagnosis not present

## 2021-09-04 DIAGNOSIS — M25561 Pain in right knee: Secondary | ICD-10-CM | POA: Diagnosis present

## 2021-09-04 DIAGNOSIS — R2689 Other abnormalities of gait and mobility: Secondary | ICD-10-CM | POA: Insufficient documentation

## 2021-09-04 NOTE — Therapy (Signed)
[facility] ?[place], [place], [address] ?Phone: [phone]   Fax:  [phone] ? ?Physical Therapy Treatment ? ?Patient Details  ?Name: Angelica Davis ?MRN: [id number] ?Date of Birth: [birth year] ?Referring Provider (PT): Marchia Bond, MD ? ? ?Encounter Date: 09/04/2021 ? ? PT End of Session - 09/04/21 0924   ? ? Visit Number 2   ? Number of Visits 12   ? Date for PT Re-Evaluation 10/14/21   ? Authorization Type UHC Medicare   ? PT Start Time [phone]   ? PT Stop Time [phone]   ? PT Time Calculation (min) 46 min   ? Activity Tolerance Patient tolerated treatment well   ? Behavior During Therapy [facility] for tasks assessed/performed   ? ?  ?  ? ?  ? ? ?Past Medical History:  ?Diagnosis Date  ? Arthritis   ? Colitis   ? Diverticulosis   ? History of blood transfusion   ? after birth of child  ? Hx of colonic polyps - adenomas 08/16/2002  ? Hyperlipidemia   ? Skin cancer   ? skin   ? ? ?Past Surgical History:  ?Procedure Laterality Date  ? CATARACT EXTRACTION Right 01/13/2019  ? COLONOSCOPY  last 05/12/2014  ? polyps removed  ? KNEE ARTHROSCOPY Left 06/2007  ? Dr. Noemi Chapel  ? PARTIAL KNEE ARTHROPLASTY Right 08/27/2021  ? Procedure: UNICOMPARTMENTAL KNEE;  Surgeon: Marchia Bond, MD;  Location: [facility];  Service: Orthopedics;  Laterality: Right;  ? TONSILLECTOMY    ? TOTAL KNEE ARTHROPLASTY Left 12/2008  ? Dr. Noemi Chapel  ? ? ?There were no vitals filed for this visit. ? ? Subjective Assessment - 09/04/21 0838   ? ? Subjective Pt reports no pain, felt nausea yesterday from all the medication so did not get a chance to do exercises.   ? 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 Patient Stated Goals "To be able to get back to what I'm used to doing w/o pain"   ? Currently in Pain? No/denies   ? ?  ?  ? ?  ? ? ? ? ? ? ? ? ? ? ? ? ? ? ? ? ? ? ? ? [facility] PT Treatment/Exercise - 09/04/21 0001   ? ?  ? Ambulation/Gait  ?  Ambulation/Gait Yes   ? Assistive device Straight cane   ? Gait Pattern Antalgic;Decreased weight shift to right;Decreased stance time - right;Decreased stride length;Lateral trunk lean to left   ? Gait Comments briefly reviewed gait pattern   ?  ? Exercises  ? Exercises Knee/Hip   ?  ? Knee/Hip Exercises: Aerobic  ? Nustep L3x37min   ?  ? Knee/Hip Exercises: Standing  ? Forward Step Up Right;10 reps;Hand Hold: 2;Step Height: 4"   ?  ? Knee/Hip Exercises: Seated  ? Long Arc Quad AROM;Right;10 reps   ? Heel Slides AAROM;Right;15 reps   ? Heel Slides Limitations with strap + foot propped on peanut ball   ? Sit to Sand 10 reps;with UE support   L LE slightly fwd  ?  ? Knee/Hip Exercises: Supine  ? Short Arc [employer] Strengthening;Right;10 reps   ? Short Arc [employer] Limitations with 2lb weight, slightly raising hip off from bolster   ? Heel Slides AAROM;10 reps   ? Heel Slides Limitations feet on peanut ball with strap   ?  ? Manual Therapy  ? Manual  Therapy Passive ROM   ? Passive ROM knee flexion with feet on peanut ball, hamstring stretch in supine 2x30"   ? ?  ?  ? ?  ? ? ? ? ? ? ? ? ? ? ? ? PT Short Term Goals - 09/04/21 0924   ? ?  ? PT SHORT TERM GOAL #1  ? Title Patient will be independent with initial HEP   ? Status On-going   ? Target Date 09/23/21   ?  ? PT SHORT TERM GOAL #2  ? Title Patient will improve R knee flexion to >/= 90 dg to facilitate increased ease of mobility and transitions   ? Status On-going   ? Target Date 09/23/21   ? ?  ?  ? ?  ? ? ? ? PT Long Term Goals - 09/04/21 0924   ? ?  ? PT LONG TERM GOAL #1  ? Title Patient will demonstrate independent use of ongoing/advanced HEP to facilitate ability to maintain/progress functional gains from skilled physical therapy services   ? Status On-going   ? Target Date 10/14/21   ?  ? PT LONG TERM GOAL #2  ? Title Patient will demonstrate R knee AROM to >/= 2-120 dg to allow for normal gait and stair mechanics   ? Status On-going   ? Target Date  10/14/21   ?  ? PT LONG TERM GOAL #3  ? Title Patient will demonstrate improved B LE strength to >/= 4/5 to 4+/5 for improved stability and ease of mobility   ? Status On-going   ? Target Date 10/14/21   ?  ? PT LONG TERM GOAL #4  ? Title Patient will ambulate with normal gait pattern and good gait stability w/o AD   ? Status On-going   ? Target Date 10/14/21   ?  ? PT LONG TERM GOAL #5  ? Title Patient able to ascend/descend stairs with reciprocal gait and no greater than single rail use with good mechanics   ? Status On-going   ? Target Date 10/14/21   ?  ? PT LONG TERM GOAL #6  ? Title Patient to report ability to perform ADLs, household, and leisure activities without limitation due to R knee pain, LOM or weakness   ? Status On-going   ? Target Date 10/14/21   ? ?  ?  ? ?  ? ? ? ? ? ? ? ? Plan - 09/04/21 0924   ? ? Clinical Impression Statement Pt reported some nausea yesterday which she believes is from medications from surgery. She noted compliance with HEP. Progressed ROM and strengthening today w/o any increased pain or complications. She showed stable gait with cane, mildly antalgic with decreased hip/knee flexion during swing phase. Started fwd steps today, she was able to do 4' without compensation but cues were given for full hip extension on step up. Pt declined modalities post session.   ? Personal Factors and Comorbidities Age;Comorbidity 3+;Fitness;Past/Current Experience   ? Comorbidities L TKA 12/2008, OA, HLD, diverticulosis   ? PT Frequency 2x / week   ? PT Duration 6 weeks   ? PT Treatment/Interventions ADLs/Self Care Home Management;Cryotherapy;Electrical Stimulation;Iontophoresis 4mg /ml Dexamethasone;Moist Heat;DME Instruction;Gait training;Stair training;Functional mobility training;Therapeutic activities;Therapeutic exercise;Balance training;Neuromuscular re-education;Patient/family education;Manual techniques;Scar mobilization;Passive range of motion;Dry needling;Energy  conservation;Taping;Vasopneumatic Device;Joint Manipulations   ? PT Next Visit Plan R knee ROM; LE stretching and strengtheing; review of gait training with SPC if needed; MT and modalities PRN for pain, edema and ROM   ?  PT Home Exercise Plan Access Code: [email or web address]   ? Consulted and Agree with Plan of Care Patient   ? ?  ?  ? ?  ? ? ?Patient will benefit from skilled therapeutic intervention in order to improve the following deficits and impairments:  Abnormal gait, Decreased activity tolerance, Decreased balance, Decreased knowledge of use of DME, Decreased mobility, Decreased range of motion, Decreased safety awareness, Decreased scar mobility, Decreased strength, Difficulty walking, Increased edema, Increased fascial restricitons, Increased muscle spasms, Impaired perceived functional ability, Impaired flexibility, Improper body mechanics, Postural dysfunction, Pain ? ?Visit Diagnosis: ?Stiffness of right knee, not elsewhere classified ? ?Muscle weakness (generalized) ? ?Acute pain of right knee ? ?Other abnormalities of gait and mobility ? ?Localized edema ? ? ? ? ?Problem List ?Patient Active Problem List  ? Diagnosis Date Noted  ? 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 Osteoarthritis of right knee 08/06/2021  ? Pain of left hand 11/16/2019  ? Trigger finger of left hand 11/16/2019  ? Elevated lipids 09/04/2015  ? Hx of colonic polyps - adenomas 08/16/2002  ? ? ?Artist Pais, PTA ?09/04/2021, 9:39 AM ? ?[facility] ?[facility] ?[address] ?[place], [place], [address] ?Phone: [phone]   Fax:  [phone] ? ?Name: Angelica Davis ?MRN: [id number] ?Date of Birth: Jun 26, 1941 ? ? ? ?

## 2021-09-09 ENCOUNTER — Other Ambulatory Visit: Payer: Self-pay

## 2021-09-09 ENCOUNTER — Encounter: Payer: Self-pay | Admitting: Physical Therapy

## 2021-09-09 ENCOUNTER — Ambulatory Visit: Payer: Medicare Other | Admitting: Physical Therapy

## 2021-09-09 DIAGNOSIS — M6281 Muscle weakness (generalized): Secondary | ICD-10-CM

## 2021-09-09 DIAGNOSIS — M25661 Stiffness of right knee, not elsewhere classified: Secondary | ICD-10-CM | POA: Diagnosis not present

## 2021-09-09 DIAGNOSIS — M25561 Pain in right knee: Secondary | ICD-10-CM

## 2021-09-09 DIAGNOSIS — R600 Localized edema: Secondary | ICD-10-CM

## 2021-09-09 DIAGNOSIS — R2689 Other abnormalities of gait and mobility: Secondary | ICD-10-CM

## 2021-09-09 NOTE — Therapy (Signed)
[facility] [address]  [place], [place], [address] Phone: [phone]   Fax:  [phone]  Physical Therapy Treatment / Progress Note  Patient Details  Name: Angelica Davis MRN: [id number] Date of Birth: 1940/10/14 Referring Provider (PT): Marchia Bond, MD  Progress Note  Reporting Period 09/02/2021 to 09/09/2021  See note below for Objective Data and Assessment of Progress/Goals.     Encounter Date: 09/09/2021   PT End of Session - 09/09/21 0848     Visit Number 3    Number of Visits 12    Date for PT Re-Evaluation 10/14/21    Authorization Type [employer] Medicare    Progress Note Due on Visit 12   MD PN on visit #3 - 09/09/21   PT Start Time 0848    PT Stop Time 0939    PT Time Calculation (min) 51 min    Activity Tolerance Patient tolerated treatment well    Behavior During Therapy [facility] for tasks assessed/performed             Past Medical History:  Diagnosis Date   Arthritis    Colitis    Diverticulosis    History of blood transfusion    after birth of child   Hx of colonic polyps - adenomas 08/16/2002   Hyperlipidemia    Skin cancer    skin     Past Surgical History:  Procedure Laterality Date   CATARACT EXTRACTION Right 01/13/2019   COLONOSCOPY  last 05/12/2014   polyps removed   KNEE ARTHROSCOPY Left 06/2007   Dr. Noemi Chapel   PARTIAL KNEE ARTHROPLASTY Right 08/27/2021   Procedure: UNICOMPARTMENTAL KNEE;  Surgeon: Marchia Bond, MD;  Location: [facility];  Service: Orthopedics;  Laterality: Right;   TONSILLECTOMY     TOTAL KNEE ARTHROPLASTY Left 12/2008   Dr. Noemi Chapel    There were no vitals filed for this visit.   Subjective Assessment - 09/09/21 0851     Subjective Pt denies pain, noting mostly stiffness.    Pertinent History R unicompartmental knee replacement 08/27/21, L TKA 12/2008    Patient Stated Goals "To be able to get back to what I'm used to doing w/o pain"    Currently in Pain?  No/denies                [facility] PT Assessment - 09/09/21 0848       Assessment   Medical Diagnosis R unicompartmental knee replacement    Referring Provider (PT) Marchia Bond, MD    Onset Date/Surgical Date 08/27/21    Next MD Visit 09/09/21      AROM   Right Knee Extension 8    Right Knee Flexion 96      PROM   Right Knee Extension 4   supported in supine                          [facility] Adult PT Treatment/Exercise - 09/09/21 0848       Ambulation/Gait   Ambulation/Gait Assistance 5: Supervision    Ambulation/Gait Assistance Details cues for increased hip and knee flexion during swing through with heel strike on weight acceptance to promote knee extension    Ambulation Distance (Feet) 100 Feet    Assistive device None    Gait Pattern Step-through pattern;Decreased hip/knee flexion - right      Exercises   Exercises Knee/Hip      Knee/Hip Exercises: Stretches  Passive Hamstring Stretch Right;4 reps;30 seconds    Passive Hamstring Stretch Limitations hooklying with strap; last 2 reps + DF for calf stretch      Knee/Hip Exercises: Aerobic   Nustep L4 x 6 min (UE/LE)      Knee/Hip Exercises: Standing   Hip Flexion Both;10 reps;Stengthening;Knee bent    Hip Flexion Limitations march with 2# at ankle; UE support on counter    Hip Abduction Right;10 reps;Stengthening;Knee straight    Abduction Limitations 2# at ankle; UE support on counter    Hip Extension Right;10 reps;Stengthening;Knee straight    Extension Limitations 2# at ankle; UE support on counter      Knee/Hip Exercises: Seated   Long Arc Quad Right;2 sets;10 reps;AROM;Strengthening;Weights    Long Arc Quad Weight 2 lbs.      Knee/Hip Exercises: Supine   Short Arc Quad Sets Right;2 sets;10 reps;AROM;Strengthening    Short Arc Quad Sets Limitations 2nd set with 2# weight; over 8" FR    Knee Extension Right;10 reps;2 sets;AROM;Strengthening    Knee Extension Limitations quad + glute sets  pressing heels into peanut ball    Knee Flexion Right;10 reps;2 sets;AROM;AAROM    Knee Flexion Limitations HS curls with feet on peanut ball - 2nd set with strap assist for flexion stretch      Modalities   Modalities Vasopneumatic      Vasopneumatic   Number Minutes Vasopneumatic  10 minutes    Vasopnuematic Location  Knee    Vasopneumatic Pressure Medium    Vasopneumatic Temperature  34                       PT Short Term Goals - 09/09/21 0923       PT SHORT TERM GOAL #1   Title Patient will be independent with initial HEP    Status Achieved   09/09/21     PT SHORT TERM GOAL #2   Title Patient will improve R knee flexion to >/= 90 dg to facilitate increased ease of mobility and transitions    Status Achieved   09/09/21 - R knee AROM 8-96, with supported knee extension of 4              PT Long Term Goals - 09/04/21 0924       PT LONG TERM GOAL #1   Title Patient will demonstrate independent use of ongoing/advanced HEP to facilitate ability to maintain/progress functional gains from skilled physical therapy services    Status On-going    Target Date 10/14/21      PT LONG TERM GOAL #2   Title Patient will demonstrate R knee AROM to >/= 2-120 dg to allow for normal gait and stair mechanics    Status On-going    Target Date 10/14/21      PT LONG TERM GOAL #3   Title Patient will demonstrate improved B LE strength to >/= 4/5 to 4+/5 for improved stability and ease of mobility    Status On-going    Target Date 10/14/21      PT LONG TERM GOAL #4   Title Patient will ambulate with normal gait pattern and good gait stability w/o AD    Status On-going    Target Date 10/14/21      PT LONG TERM GOAL #5   Title Patient able to ascend/descend stairs with reciprocal gait and no greater than single rail use with good mechanics    Status On-going    Target  Date 10/14/21      PT LONG TERM GOAL #6   Title Patient to report ability to perform ADLs, household,  and leisure activities without limitation due to R knee pain, LOM or weakness    Status On-going    Target Date 10/14/21                   Plan - 09/09/21 0930     Clinical Impression Statement Angelica Davis reports more stiffness than pain since surgery but has noted some distal medial knee pain when she tries to sleep on her L side with a pillow between her knees, and some anterior shin pain after walking. Shin pain likely due to excessive DF to compensate for limited hip and knee flexion, therefore reviewed proper step/gait pattern encouraging increased hip and knee flexion with heel strike on weight acceptance - pt able to demonstrate improved pattern even w/o SPC. HEP going well with only minor clarifications necessary. Good gains noted with R knee ROM with AROM now 8-96 and supported extension down to 4. All STGs now met. Angelica Davis will continue to benefit from skilled PT to further normalize gait pattern as well as restore functional R knee ROM and LE strength to allow her to return to her PLOF.    Comorbidities L TKA 12/2008, OA, HLD, diverticulosis    Rehab Potential Good    PT Frequency 2x / week    PT Duration 6 weeks    PT Treatment/Interventions ADLs/Self Care Home Management;Cryotherapy;Electrical Stimulation;Iontophoresis 63m/ml Dexamethasone;Moist Heat;DME Instruction;Gait training;Stair training;Functional mobility training;Therapeutic activities;Therapeutic exercise;Balance training;Neuromuscular re-education;Patient/family education;Manual techniques;Scar mobilization;Passive range of motion;Dry needling;Energy conservation;Taping;Vasopneumatic Device;Joint Manipulations    PT Next Visit Plan R knee ROM; LE stretching and strengtheing; [facility]; MT and modalities PRN for pain, edema and ROM    PT Home Exercise Plan Access Code: [email or web address]    Consulted and Agree with Plan of Care Patient             Patient will benefit from skilled  therapeutic intervention in order to improve the following deficits and impairments:  Abnormal gait, Decreased activity tolerance, Decreased balance, Decreased knowledge of use of DME, Decreased mobility, Decreased range of motion, Decreased safety awareness, Decreased scar mobility, Decreased strength, Difficulty walking, Increased edema, Increased fascial restricitons, Increased muscle spasms, Impaired perceived functional ability, Impaired flexibility, Improper body mechanics, Postural dysfunction, Pain  Visit Diagnosis: Stiffness of right knee, not elsewhere classified  Muscle weakness (generalized)  Acute pain of right knee  Other abnormalities of gait and mobility  Localized edema     Problem List Patient Active Problem List   Diagnosis Date Noted   S/P right unicompartmental knee replacement 08/27/2021   Osteoarthritis of right knee 08/06/2021   Pain of left hand 11/16/2019   Trigger finger of left hand 11/16/2019   Elevated lipids 09/04/2015   Hx of colonic polyps - adenomas 08/16/2002    Angelica Davis Spanish PT 09/09/2021, 9:48 AM  [facility] [place] [address]Phone: [phone]  Fax:  [phone] Name: Angelica DOTTAVIOMRN: [id number] of Birth: [birth year]

## 2021-09-12 ENCOUNTER — Other Ambulatory Visit: Payer: Self-pay

## 2021-09-12 ENCOUNTER — Ambulatory Visit: Payer: Medicare Other

## 2021-09-12 DIAGNOSIS — M25661 Stiffness of right knee, not elsewhere classified: Secondary | ICD-10-CM

## 2021-09-12 DIAGNOSIS — M25561 Pain in right knee: Secondary | ICD-10-CM

## 2021-09-12 DIAGNOSIS — R2689 Other abnormalities of gait and mobility: Secondary | ICD-10-CM

## 2021-09-12 DIAGNOSIS — R600 Localized edema: Secondary | ICD-10-CM

## 2021-09-12 DIAGNOSIS — M6281 Muscle weakness (generalized): Secondary | ICD-10-CM

## 2021-09-12 NOTE — Patient Instructions (Addendum)
Access Code: [email or web address] ?URL: [email or web address] ?Date: 09/12/2021 ?Prepared by: Clarene Essex ? ?Exercises ?Supine Heel Slide with Strap - 2-3 x daily - 7 x weekly - 2 sets - 10 reps - 3 sec hold ?Hooklying Hamstring Stretch with Strap - 2-3 x daily - 7 x weekly - 3 reps - 30 sec hold ?Seated Hamstring Stretch with Strap - 2-3 x daily - 7 x weekly - 3 reps - 30 sec hold ?Seated Heel Slide - 2-3 x daily - 7 x weekly - 2 sets - 10 reps - 3 sec hold ?Seated Long Arc Quad - 2-3 x daily - 7 x weekly - 2 sets - 10 reps - 3 sec hold ?Gastroc Stretch on Wall - 1 x daily - 7 x weekly - 3 sets - 10 reps ?Marching with Resistance - 1 x daily - 3 x weekly - 3 sets - 10 reps ?Standing Hip Abduction with Resistance at Thighs - 1 x daily - 3 x weekly - 3 sets - 10 reps ?Standing Hip Extension with Resistance at Ankles and Counter Support - 1 x daily - 3 x weekly - 3 sets - 10 reps ? ?

## 2021-09-12 NOTE — Therapy (Signed)
[facility] [address]  [place], [place], [address] Phone: [phone]   Fax:  [phone]  Physical Therapy Treatment  Patient Details  Name: Angelica Davis MRN: [id number] Date of Birth: 1940-08-20 Referring Provider (PT): Marchia Bond, MD   Encounter Date: 09/12/2021   PT End of Session - 09/12/21 0933     Visit Number 4    Number of Visits 12    Date for PT Re-Evaluation 10/14/21    Authorization Type [employer] Medicare    Progress Note Due on Visit 12   MD PN on visit #3 - 09/09/21   PT Start Time 2947    PT Stop Time 0936    PT Time Calculation (min) 58 min    Activity Tolerance Patient tolerated treatment well    Behavior During Therapy [facility] for tasks assessed/performed             Past Medical History:  Diagnosis Date   Arthritis    Colitis    Diverticulosis    History of blood transfusion    after birth of child   Hx of colonic polyps - adenomas 08/16/2002   Hyperlipidemia    Skin cancer    skin     Past Surgical History:  Procedure Laterality Date   CATARACT EXTRACTION Right 01/13/2019   COLONOSCOPY  last 05/12/2014   polyps removed   KNEE ARTHROSCOPY Left 06/2007   Dr. Noemi Chapel   PARTIAL KNEE ARTHROPLASTY Right 08/27/2021   Procedure: UNICOMPARTMENTAL KNEE;  Surgeon: Marchia Bond, MD;  Location: [facility];  Service: Orthopedics;  Laterality: Right;   TONSILLECTOMY     TOTAL KNEE ARTHROPLASTY Left 12/2008   Dr. Noemi Chapel    There were no vitals filed for this visit.   Subjective Assessment - 09/12/21 0840     Subjective Pt reports that the knee just stays stiff but when she moves around more it gets better. Pt reports that the MD visit went well, he said "just continue what you are doing."    Pertinent History R unicompartmental knee replacement 08/27/21, L TKA 12/2008    Patient Stated Goals "To be able to get back to what I'm used to doing w/o pain"    Currently in Pain? No/denies                                [facility] Adult PT Treatment/Exercise - 09/12/21 0001       Knee/Hip Exercises: Aerobic   Nustep L5 x 6 min (UE/LE)      Knee/Hip Exercises: Machines for Strengthening   Cybex Knee Extension BLE 15#, RLE 5# x 10 each    Cybex Knee Flexion BLE 15#, RLE 5# x 10      Knee/Hip Exercises: Standing   Hip Flexion Both;10 reps;Stengthening;Knee bent    Hip Flexion Limitations march with red TB at ankles, 2 hand support    Hip Abduction Stengthening;Both;10 reps;Knee straight;2 sets    Abduction Limitations red TB at ankle, 2 hand support    Hip Extension Stengthening;Both;10 reps;Knee straight    Extension Limitations red TB at ankle, 2 hand support    Functional Squat 10 reps    Functional Squat Limitations mini squat at counter      Knee/Hip Exercises: Seated   Long Arc Quad Strengthening;Right;20 reps;Weights    Long Arc Quad Weight 3 lbs.    Hamstring Curl Strengthening;Right;20 reps  Hamstring Limitations RTB      Knee/Hip Exercises: Supine   Heel Slides AAROM;10 reps    Heel Slides Limitations feet on peanut ball with manual overpressure into flexion      Vasopneumatic   Number Minutes Vasopneumatic  10 minutes    Vasopnuematic Location  Knee    Vasopneumatic Pressure Medium    Vasopneumatic Temperature  34      Manual Therapy   Manual Therapy Passive ROM    Manual therapy comments supine and prone    Passive ROM knee flexion in prone with prolonged holds, passive HS stretching in supin                     PT Education - 09/12/21 0932     Education Details HEP update and review    Person(s) Educated Patient    Methods Explanation;Demonstration;Handout    Comprehension Verbalized understanding;Returned demonstration              PT Short Term Goals - 09/09/21 0923       PT SHORT TERM GOAL #1   Title Patient will be independent with initial HEP    Status Achieved   09/09/21     PT SHORT TERM GOAL #2    Title Patient will improve R knee flexion to >/= 90 dg to facilitate increased ease of mobility and transitions    Status Achieved   09/09/21 - R knee AROM 8-96, with supported knee extension of 4              PT Long Term Goals - 09/04/21 0924       PT LONG TERM GOAL #1   Title Patient will demonstrate independent use of ongoing/advanced HEP to facilitate ability to maintain/progress functional gains from skilled physical therapy services    Status On-going    Target Date 10/14/21      PT LONG TERM GOAL #2   Title Patient will demonstrate R knee AROM to >/= 2-120 dg to allow for normal gait and stair mechanics    Status On-going    Target Date 10/14/21      PT LONG TERM GOAL #3   Title Patient will demonstrate improved B LE strength to >/= 4/5 to 4+/5 for improved stability and ease of mobility    Status On-going    Target Date 10/14/21      PT LONG TERM GOAL #4   Title Patient will ambulate with normal gait pattern and good gait stability w/o AD    Status On-going    Target Date 10/14/21      PT LONG TERM GOAL #5   Title Patient able to ascend/descend stairs with reciprocal gait and no greater than single rail use with good mechanics    Status On-going    Target Date 10/14/21      PT LONG TERM GOAL #6   Title Patient to report ability to perform ADLs, household, and leisure activities without limitation due to R knee pain, LOM or weakness    Status On-going    Target Date 10/14/21                   Plan - 09/12/21 0933     Clinical Impression Statement Pt noted some continued stiffness in the her knee when getting started but as she does more exercises it loosens up. To work on more strength to improve her hip and knee flexion with gait, I added standing hip strengthening  at counter top to HEP. Today was strength focused on isolating the knee and hip musculature to improve gait mechanics and tolerance for ADLs. We did a little manual work to improve ROM of  the R knee and finished session with game ready to reduce post exercise swelling and soreness. Pt had a good response to treatment.    Personal Factors and Comorbidities Age;Comorbidity 3+;Fitness;Past/Current Experience    Comorbidities L TKA 12/2008, OA, HLD, diverticulosis    PT Frequency 2x / week    PT Duration 6 weeks    PT Treatment/Interventions ADLs/Self Care Home Management;Cryotherapy;Electrical Stimulation;Iontophoresis '4mg'$ /ml Dexamethasone;Moist Heat;DME Instruction;Gait training;Stair training;Functional mobility training;Therapeutic activities;Therapeutic exercise;Balance training;Neuromuscular re-education;Patient/family education;Manual techniques;Scar mobilization;Passive range of motion;Dry needling;Energy conservation;Taping;Vasopneumatic Device;Joint Manipulations    PT Next Visit Plan R knee ROM; LE stretching and strengtheing; review of gait training weaning from [facility] if needed; MT and modalities PRN for pain, edema and ROM    PT Home Exercise Plan Access Code: [email or web address]    Consulted and Agree with Plan of Care Patient             Patient will benefit from skilled therapeutic intervention in order to improve the following deficits and impairments:  Abnormal gait, Decreased activity tolerance, Decreased balance, Decreased knowledge of use of DME, Decreased mobility, Decreased range of motion, Decreased safety awareness, Decreased scar mobility, Decreased strength, Difficulty walking, Increased edema, Increased fascial restricitons, Increased muscle spasms, Impaired perceived functional ability, Impaired flexibility, Improper body mechanics, Postural dysfunction, Pain  Visit Diagnosis: Stiffness of right knee, not elsewhere classified  Muscle weakness (generalized)  Acute pain of right knee  Other abnormalities of gait and mobility  Localized edema     Problem List Patient Active Problem List   Diagnosis Date Noted   S/P right unicompartmental knee replacement  08/27/2021   Osteoarthritis of right knee 08/06/2021   Pain of left hand 11/16/2019   Trigger finger of left hand 11/16/2019   Elevated lipids 09/04/2015   Hx of colonic polyps - adenomas 08/16/2002    Artist Pais, PTA 09/12/2021, 9:55 AM  [facility] [address]  [place], [place], [address] Phone: [phone]   Fax:  [phone]  Name: KIAIRA POINTER MRN: [id number] Date of Birth: October 19, 1940

## 2021-09-16 ENCOUNTER — Ambulatory Visit: Payer: Medicare Other | Admitting: Physical Therapy

## 2021-09-16 ENCOUNTER — Other Ambulatory Visit: Payer: Self-pay

## 2021-09-16 ENCOUNTER — Encounter: Payer: Self-pay | Admitting: Physical Therapy

## 2021-09-16 DIAGNOSIS — M25661 Stiffness of right knee, not elsewhere classified: Secondary | ICD-10-CM | POA: Diagnosis not present

## 2021-09-16 DIAGNOSIS — R600 Localized edema: Secondary | ICD-10-CM

## 2021-09-16 DIAGNOSIS — M6281 Muscle weakness (generalized): Secondary | ICD-10-CM

## 2021-09-16 DIAGNOSIS — M25561 Pain in right knee: Secondary | ICD-10-CM

## 2021-09-16 DIAGNOSIS — R2689 Other abnormalities of gait and mobility: Secondary | ICD-10-CM

## 2021-09-16 NOTE — Therapy (Signed)
[place] ?[facility] ?[address] ?[place], [place], [address] ?Phone: [phone]   Fax:  [phone] ? ?Physical Therapy Treatment ? ?Patient Details  ?Name: Angelica Davis ?MRN: [id number] ?Date of Birth: 1941/01/03 ?Referring Provider (PT): Angelica Bond, MD ? ? ?Encounter Date: 09/16/2021 ? ? PT End of Session - 09/16/21 0846   ? ? Visit Number 5   ? Number of Visits 12   ? Date for PT Re-Evaluation 10/14/21   ? Authorization Type U[employer] Medicare   ? Progress Note Due on Visit 12   MD PN on visit #3 - 09/09/21  ? PT Start Time [phone]   ? PT Stop Time [phone]   ? PT Time Calculation (min) 52 min   ? Activity Tolerance Patient tolerated treatment well   ? Behavior During Therapy [facility] for tasks assessed/performed   ? ?  ?  ? ?  ? ? ?Past Medical History:  ?Diagnosis Date  ? Arthritis   ? Colitis   ? Diverticulosis   ? History of blood transfusion   ? after birth of child  ? Hx of colonic polyps - adenomas 08/16/2002  ? Hyperlipidemia   ? Skin cancer   ? skin   ? ? ?Past Surgical History:  ?Procedure Laterality Date  ? CATARACT EXTRACTION Right 01/13/2019  ? COLONOSCOPY  last 05/12/2014  ? polyps removed  ? KNEE ARTHROSCOPY Left 06/2007  ? Dr. Noemi Davis  ? PARTIAL KNEE ARTHROPLASTY Right 08/27/2021  ? Procedure: UNICOMPARTMENTAL KNEE;  Surgeon: Angelica Bond, MD;  Location: [facility];  Service: Orthopedics;  Laterality: Right;  ? TONSILLECTOMY    ? TOTAL KNEE ARTHROPLASTY Left 12/2008  ? Dr. Noemi Davis  ? ? ?There were no vitals filed for this visit. ? ? Subjective Assessment - 09/16/21 0850   ? ? Subjective Pt reports she forgot to take her Tylenol this morning so having a little pain.   ? 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 Patient Stated Goals "To be able to get back to what I'm used to doing w/o pain"   ? Currently in Pain? Yes   ? Pain Score 4    3-4/10  ? Pain Location Knee   ? Pain Orientation Right   ? Pain Descriptors / Indicators  Throbbing   ? Pain Type Acute pain;Surgical pain   ? Pain Frequency Intermittent   with end ROM  ? ?  ?  ? ?  ? ? ? ? ? [facility] PT Assessment - 09/16/21 0846   ? ?  ? AROM  ? Right Knee Extension 5   ? Right Knee Flexion 107   ? ?  ?  ? ?  ? ? ? ? ? ? ? ? ? ? ? ? ? ? ? ? [place] Adult PT Treatment/Exercise - 09/16/21 0846   ? ?  ? Knee/Hip Exercises: Stretches  ? Knee: Self-Stretch to increase Flexion Right   ? Knee: Self-Stretch Limitations 10 x 5 sec - step stretch on BATCA seat at lowest position   ? Gastroc Stretch Right;3 reps;30 seconds   ? Gastroc Stretch Limitations ProStretch   ?  ? Knee/Hip Exercises: Aerobic  ? Recumbent Bike partial revolutions x 6 min   ?  ? Knee/Hip Exercises: Machines for Strengthening  ? Cybex Knee Flexion B LE 20# x 10; B con/R ecc 15# x 10   ? Cybex Leg Press B LE 20# 2 x  10   ?  ? Knee/Hip Exercises: Standing  ? Heel Raises Both;10 reps;3 seconds;2 sets   ? Heel Raises Limitations cues for quad & glute set with lift   ? Terminal Knee Extension Right;2 sets;10 reps;Strengthening;Theraband   ? Theraband Level (Terminal Knee Extension) Level 4 (Blue)   ? Functional Squat 10 reps;5 seconds   ? Functional Squat Limitations counter squat   ?  ? Modalities  ? Modalities Vasopneumatic   ?  ? Vasopneumatic  ? Number Minutes Vasopneumatic  10 minutes   ? Vasopnuematic Location  Knee   ? Vasopneumatic Pressure Medium   ? Vasopneumatic Temperature  34?   ? ?  ?  ? ?  ? ? ? ? ? ? ? ? ? ? ? ? PT Short Term Goals - 09/09/21 0923   ? ?  ? PT SHORT TERM GOAL #1  ? Title Patient will be independent with initial HEP   ? Status Achieved   09/09/21  ?  ? PT SHORT TERM GOAL #2  ? Title Patient will improve R knee flexion to >/= 90 dg to facilitate increased ease of mobility and transitions   ? Status Achieved   09/09/21 - R knee AROM 8-96?, with supported knee extension of 4?  ? ?  ?  ? ?  ? ? ? ? PT Long Term Goals - 09/16/21 0927   ? ?  ? PT LONG TERM GOAL #1  ? Title Patient will demonstrate independent use  of ongoing/advanced HEP to facilitate ability to maintain/progress functional gains from skilled physical therapy services   ? Status Partially Met   ? Target Date 10/14/21   ?  ? PT LONG TERM GOAL #2  ? Title Patient will demonstrate R knee AROM to >/= 2-120 dg to allow for normal gait and stair mechanics   ? Status On-going   ? Target Date 10/14/21   ?  ? PT LONG TERM GOAL #3  ? Title Patient will demonstrate improved B LE strength to >/= 4/5 to 4+/5 for improved stability and ease of mobility   ? Status On-going   ? Target Date 10/14/21   ?  ? PT LONG TERM GOAL #4  ? Title Patient will ambulate with normal gait pattern and good gait stability w/o AD   ? Status Partially Met   ? Target Date 10/14/21   ?  ? PT LONG TERM GOAL #5  ? Title Patient able to ascend/descend stairs with reciprocal gait and no greater than single rail use with good mechanics   ? Status On-going   ? Target Date 10/14/21   ?  ? PT LONG TERM GOAL #6  ? Title Patient to report ability to perform ADLs, household, and leisure activities without limitation due to R knee pain, LOM or weakness   ? Status On-going   ? Target Date 10/14/21   ? ?  ?  ? ?  ? ? ? ? ? ? ? ? Plan - 09/16/21 0928   ? ? Clinical Impression Statement Angelica Davis reports some increased pain this morning - feels like she pushed herself too hard on her bike at home, then forgot to taker her Tylenol this morning. She feels like her HEP is going well and her R knee seems to be bending better. R knee AROM currently 5-107?Marland Kitchen Continued to progress ROM, stretching and strengthening exercises with good tolerance but cues necessary for pacing and hold times with some exercises. Session concluded with GR  vasopnuematic compression to reduce post-exercise pain and edema.   ? Comorbidities L TKA 12/2008, OA, HLD, diverticulosis   ? Rehab Potential Good   ? PT Frequency 2x / week   ? PT Duration 6 weeks   ? PT Treatment/Interventions ADLs/Self Care Home Management;Cryotherapy;Electrical  Stimulation;Iontophoresis 34m/ml Dexamethasone;Moist Heat;DME Instruction;Gait training;Stair training;Functional mobility training;Therapeutic activities;Therapeutic exercise;Balance training;Neuromuscular re-education;Patient/family education;Manual techniques;Scar mobilization;Passive range of motion;Dry needling;Energy conservation;Taping;Vasopneumatic Device;Joint Manipulations   ? PT Next Visit Plan R knee ROM; LE stretching and strengtheing; review of gait training [facility]; MT and modalities PRN for pain, edema and ROM   ? PT Home Exercise Plan Access Code: [email or web address]   ? Consulted and Agree with Plan of Care Patient   ? ?  ?  ? ?  ? ? ?Patient will benefit from skilled therapeutic intervention in order to improve the following deficits and impairments:  Abnormal gait, Decreased activity tolerance, Decreased balance, Decreased knowledge of use of DME, Decreased mobility, Decreased range of motion, Decreased safety awareness, Decreased scar mobility, Decreased strength, Difficulty walking, Increased edema, Increased fascial restricitons, Increased muscle spasms, Impaired perceived functional ability, Impaired flexibility, Improper body mechanics, Postural dysfunction, Pain ? ?Visit Diagnosis: ?Stiffness of right knee, not elsewhere classified ? ?Muscle weakness (generalized) ? ?Acute pain of right knee ? ?Other abnormalities of gait and mobility ? ?Localized edema ? ? ? ? ?Problem List ?Patient Active Problem List  ? Diagnosis Date Noted  ? 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 Osteoarthritis of right knee 08/06/2021  ? Pain of left hand 11/16/2019  ? Trigger finger of left hand 11/16/2019  ? Elevated lipids 09/04/2015  ? Hx of colonic polyps - adenomas 08/16/2002  ? ? ?JPercival Spanish PT ?09/16/2021, 10:45 AM ? ?[place] [facility] ?[address] [address]?Phone: [phone]  Fax:   [phone]? ?Name: Angelica Davis?MRN: [id number]?Date of Birth: 2February 12, 1942? ? ? ?

## 2021-09-19 ENCOUNTER — Ambulatory Visit: Payer: Medicare Other

## 2021-09-19 ENCOUNTER — Other Ambulatory Visit: Payer: Self-pay

## 2021-09-19 DIAGNOSIS — M25661 Stiffness of right knee, not elsewhere classified: Secondary | ICD-10-CM | POA: Diagnosis not present

## 2021-09-19 NOTE — Therapy (Signed)
[place] ?[facility] ?[address] ?[place], [place], [address] ?Phone: [phone]   Fax:  [phone] ? ?Physical Therapy Treatment ? ?Patient Details  ?Name: Angelica Davis ?MRN: [id number] ?Date of Birth: Nov 08, 1940 ?Referring Provider (PT): Marchia Bond, MD ? ? ?Encounter Date: 09/19/2021 ? ? PT End of Session - 09/19/21 0933   ? ? Visit Number 6   ? Number of Visits 12   ? Date for PT Re-Evaluation 10/14/21   ? Authorization Type UHC Medicare   ? Progress Note Due on Visit 12   MD PN on visit #3 - 09/09/21  ? PT Start Time [phone]   ? PT Stop Time 0940   ? PT Time Calculation (min) 54 min   ? Activity Tolerance Patient tolerated treatment well   ? Behavior During Therapy [facility] for tasks assessed/performed   ? ?  ?  ? ?  ? ? ?Past Medical History:  ?Diagnosis Date  ? Arthritis   ? Colitis   ? Diverticulosis   ? History of blood transfusion   ? after birth of child  ? Hx of colonic polyps - adenomas 08/16/2002  ? Hyperlipidemia   ? Skin cancer   ? skin   ? ? ?Past Surgical History:  ?Procedure Laterality Date  ? CATARACT EXTRACTION Right 01/13/2019  ? COLONOSCOPY  last 05/12/2014  ? polyps removed  ? KNEE ARTHROSCOPY Left 06/2007  ? Dr. Noemi Chapel  ? PARTIAL KNEE ARTHROPLASTY Right 08/27/2021  ? Procedure: UNICOMPARTMENTAL KNEE;  Surgeon: Marchia Bond, MD;  Location: [facility];  Service: Orthopedics;  Laterality: Right;  ? TONSILLECTOMY    ? TOTAL KNEE ARTHROPLASTY Left 12/2008  ? Dr. Noemi Chapel  ? ? ?There were no vitals filed for this visit. ? ? Subjective Assessment - 09/19/21 0857   ? ? Subjective Pt only reporting nocturnal pain.   ? 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 Patient Stated Goals "To be able to get back to what I'm used to doing w/o pain"   ? Currently in Pain? Yes   ? Pain Score 2    ? Pain Location Knee   ? Pain Orientation Right   ? Pain Descriptors / Indicators Tightness   ? Pain Type Acute pain;Surgical pain   ? ?   ?  ? ?  ? ? ? ? ? [facility] PT Assessment - 09/19/21 0001   ? ?  ? AROM  ? Right Knee Extension 4   ? Right Knee Flexion 111   ? ?  ?  ? ?  ? ? ? ? ? ? ? ? ? ? ? ? ? ? ? ? [facility] Adult PT Treatment/Exercise - 09/19/21 0001   ? ?  ? Ambulation/Gait  ? Ambulation/Gait Yes   ? Ambulation Distance (Feet) 560 Feet   ? Assistive device None   ? Gait Pattern Step-through pattern;Decreased hip/knee flexion - right;Antalgic   ? Stairs Yes   ? Stairs Assistance 5: Supervision   ? Stair Management Technique One rail Right;One rail Left   ? Number of Stairs 13   ? Height of Stairs 8   ?  ? Knee/Hip Exercises: Aerobic  ? Recumbent Bike partial revolutions x 6 min   able to do x 4 full rev  ?  ? Knee/Hip Exercises: Machines for Strengthening  ? Cybex Knee Extension R LE 5lb 2x10, BLE 15lb x 10   ?  ? Knee/Hip  Exercises: Standing  ? Step Down 2 sets;10 reps;Hand Hold: 1;Step Height: 4"   ? Functional Squat 2 sets;10 reps   ? Functional Squat Limitations squat touch to chair with red TB at knees   ? Other Standing Knee Exercises golfers lifts supported on R LE 1 hand support x 10   ? ?  ?  ? ?  ? ? ? ? ? ? ? ? ? ? ? ? PT Short Term Goals - 09/09/21 0923   ? ?  ? PT SHORT TERM GOAL #1  ? Title Patient will be independent with initial HEP   ? Status Achieved   09/09/21  ?  ? PT SHORT TERM GOAL #2  ? Title Patient will improve R knee flexion to >/= 90 dg to facilitate increased ease of mobility and transitions   ? Status Achieved   09/09/21 - R knee AROM 8-96?, with supported knee extension of 4?  ? ?  ?  ? ?  ? ? ? ? PT Long Term Goals - 09/19/21 0907   ? ?  ? PT LONG TERM GOAL #1  ? Title Patient will demonstrate independent use of ongoing/advanced HEP to facilitate ability to maintain/progress functional gains from skilled physical therapy services   ? Status Partially Met   ? Target Date 10/14/21   ?  ? PT LONG TERM GOAL #2  ? Title Patient will demonstrate R knee AROM to >/= 2-120 dg to allow for normal gait and stair mechanics   ? Status  On-going   09/19/21- 4-111 deg  ? Target Date 10/14/21   ?  ? PT LONG TERM GOAL #3  ? Title Patient will demonstrate improved B LE strength to >/= 4/5 to 4+/5 for improved stability and ease of mobility   ? Status On-going   ? Target Date 10/14/21   ?  ? PT LONG TERM GOAL #4  ? Title Patient will ambulate with normal gait pattern and good gait stability w/o AD   ? Status Partially Met   ? Target Date 10/14/21   ?  ? PT LONG TERM GOAL #5  ? Title Patient able to ascend/descend stairs with reciprocal gait and no greater than single rail use with good mechanics   ? Status On-going   ? Target Date 10/14/21   ?  ? PT LONG TERM GOAL #6  ? Title Patient to report ability to perform ADLs, household, and leisure activities without limitation due to R knee pain, LOM or weakness   ? Status On-going   ? Target Date 10/14/21   ? ?  ?  ? ?  ? ? ? ? ? ? ? ? Plan - 09/19/21 0934   ? ? Clinical Impression Statement Pt is progressing well with knee ROM, 4-111 deg measured today. Pt demonstrates lack of eccentric control when leading with LLE descending stairs. Focus of interventions mostly on eccentric quad strength in WB to promote more confidence with descending stairs. Pt w/o any inccrease in pain with interventions today. Ended with GR to address swelling and soreness post session.   ? Personal Factors and Comorbidities Age;Comorbidity 3+;Fitness;Past/Current Experience   ? Comorbidities L TKA 12/2008, OA, HLD, diverticulosis   ? PT Frequency 2x / week   ? PT Duration 6 weeks   ? PT Treatment/Interventions ADLs/Self Care Home Management;Cryotherapy;Electrical Stimulation;Iontophoresis 93m/ml Dexamethasone;Moist Heat;DME Instruction;Gait training;Stair training;Functional mobility training;Therapeutic activities;Therapeutic exercise;Balance training;Neuromuscular re-education;Patient/family education;Manual techniques;Scar mobilization;Passive range of motion;Dry needling;Energy conservation;Taping;Vasopneumatic Device;Joint  Manipulations   ?  PT Next Visit Plan R knee ROM; eccentric quad strengthening ; MT and modalities PRN for pain, edema and ROM   ? PT Home Exercise Plan Access Code: [email or web address]   ? Consulted and Agree with Plan of Care Patient   ? ?  ?  ? ?  ? ? ?Patient will benefit from skilled therapeutic intervention in order to improve the following deficits and impairments:  Abnormal gait, Decreased activity tolerance, Decreased balance, Decreased knowledge of use of DME, Decreased mobility, Decreased range of motion, Decreased safety awareness, Decreased scar mobility, Decreased strength, Difficulty walking, Increased edema, Increased fascial restricitons, Increased muscle spasms, Impaired perceived functional ability, Impaired flexibility, Improper body mechanics, Postural dysfunction, Pain ? ?Visit Diagnosis: ?Stiffness of right knee, not elsewhere classified ? ?Muscle weakness (generalized) ? ?Acute pain of right knee ? ?Other abnormalities of gait and mobility ? ?Localized edema ? ? ? ? ?Problem List ?Patient Active Problem List  ? Diagnosis Date Noted  ? 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 Osteoarthritis of right knee 08/06/2021  ? Pain of left hand 11/16/2019  ? Trigger finger of left hand 11/16/2019  ? Elevated lipids 09/04/2015  ? Hx of colonic polyps - adenomas 08/16/2002  ? ? ?Artist Pais, PTA ?09/19/2021, 9:42 AM ? ?[place] ?[facility] ?[place] ?[place], [place], [address] ?Phone: [phone]   Fax:  [phone] ? ?Name: Angelica Davis ?MRN: [id number] ?Date of Birth: 1941-05-25 ? ? ? ?

## 2021-09-23 ENCOUNTER — Encounter: Payer: Self-pay | Admitting: Physical Therapy

## 2021-09-23 ENCOUNTER — Ambulatory Visit: Payer: Medicare Other | Admitting: Physical Therapy

## 2021-09-23 ENCOUNTER — Other Ambulatory Visit: Payer: Self-pay

## 2021-09-23 DIAGNOSIS — M25661 Stiffness of right knee, not elsewhere classified: Secondary | ICD-10-CM

## 2021-09-23 DIAGNOSIS — R2689 Other abnormalities of gait and mobility: Secondary | ICD-10-CM

## 2021-09-23 DIAGNOSIS — R600 Localized edema: Secondary | ICD-10-CM

## 2021-09-23 DIAGNOSIS — M6281 Muscle weakness (generalized): Secondary | ICD-10-CM

## 2021-09-23 DIAGNOSIS — M25561 Pain in right knee: Secondary | ICD-10-CM

## 2021-09-23 NOTE — Therapy (Signed)
[place] ?[facility] ?[place] ?[place], [place], [address] ?Phone: [phone]   Fax:  [phone] ? ?Physical Therapy Treatment ? ?Patient Details  ?Name: Angelica Davis ?MRN: [id number] ?Date of Birth: 05/22/1941 ?Referring Provider (PT): Angelica Bond, MD ? ? ?Encounter Date: 09/23/2021 ? ? PT End of Session - 09/23/21 0850   ? ? Visit Number 7   ? Number of Visits 12   ? Date for PT Re-Evaluation 10/14/21   ? Authorization Type UHC Medicare   ? Progress Note Due on Visit 12   MD PN on visit #3 - 09/09/21  ? PT Start Time [phone]   ? PT Stop Time [phone]   ? PT Time Calculation (min) 52 min   ? Activity Tolerance Patient tolerated treatment well   ? Behavior During Therapy [facility] for tasks assessed/performed   ? ?  ?  ? ?  ? ? ?Past Medical History:  ?Diagnosis Date  ? Arthritis   ? Colitis   ? Diverticulosis   ? History of blood transfusion   ? after birth of child  ? Hx of colonic polyps - adenomas 08/16/2002  ? Hyperlipidemia   ? Skin cancer   ? skin   ? ? ?Past Surgical History:  ?Procedure Laterality Date  ? CATARACT EXTRACTION Right 01/13/2019  ? COLONOSCOPY  last 05/12/2014  ? polyps removed  ? KNEE ARTHROSCOPY Left 06/2007  ? Dr. Noemi Davis  ? PARTIAL KNEE ARTHROPLASTY Right 08/27/2021  ? Procedure: UNICOMPARTMENTAL KNEE;  Surgeon: Angelica Bond, MD;  Location: [facility];  Service: Orthopedics;  Laterality: Right;  ? TONSILLECTOMY    ? TOTAL KNEE ARTHROPLASTY Left 12/2008  ? Dr. Noemi Davis  ? ? ?There were no vitals filed for this visit. ? ? Subjective Assessment - 09/23/21 0853   ? ? Subjective Pt reports pain typically worse first thing in the morning or after prolonged sitting due to stiffness from inactivity.   ? 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 Patient Stated Goals "To be able to get back to what I'm used to doing w/o pain"   ? Currently in Pain? Yes   ? Pain Score 1    ? Pain Location Knee   ? Pain Orientation Right    ? Pain Descriptors / Indicators Stabbing;Tightness   while in motion  ? Pain Type Acute pain;Surgical pain   ? Pain Frequency Intermittent   ? ?  ?  ? ?  ? ? ? ? ? ? ? ? ? ? ? ? ? ? ? ? ? ? ? ? [facility] Adult PT Treatment/Exercise - 09/23/21 0850   ? ?  ? Knee/Hip Exercises: Stretches  ? Gastroc Stretch Right;3 reps;30 seconds   ? Gastroc Stretch Limitations negative heel of edge of step   ?  ? Knee/Hip Exercises: Aerobic  ? Recumbent Bike L1 x 6 min   ?  ? Knee/Hip Exercises: Standing  ? Hip Flexion Right;Left;10 reps;Stengthening;Knee straight   ? Hip Flexion Limitations red TB at ankle, UE support on back of chair   ? Terminal Knee Extension Right;2 sets;10 reps;Strengthening;Theraband   ? Theraband Level (Terminal Knee Extension) Level 4 (Blue)   ? Hip ADduction Right;Left;10 reps;Strengthening   ? Hip ADduction Limitations red TB at ankle, UE support on back of chair   ? Hip Abduction Right;Left;10 reps;Stengthening;Knee straight   ? Abduction Limitations red TB at ankle, UE support on back of  chair   ? Hip Extension Right;Left;10 reps;Stengthening;Knee straight   ? Extension Limitations red TB at ankle, UE support on back of chair   ? Step Down Right;10 reps;Step Height: 6";Hand Hold: 2   ? Step Down Limitations lateral eccentric lowering with light heel touch   ?  ? Knee/Hip Exercises: Seated  ? Hamstring Curl Right;2 sets;10 reps;Strengthening   ? Hamstring Limitations red TB   ?  ? Knee/Hip Exercises: Supine  ? Patellar Mobs Pt instructed in self-mobilization of her R patella with R LE ended and supported on mat table in long-stting   ?  ? Vasopneumatic  ? Number Minutes Vasopneumatic  10 minutes   ? Vasopnuematic Location  Knee   ? Vasopneumatic Pressure Medium   ? Vasopneumatic Temperature  34?   ?  ? Manual Therapy  ? Manual Therapy Joint mobilization   ? Joint Mobilization R patellar mobs - all directions   ? ?  ?  ? ?  ? ? ? ? ? ? ? ? ? ? PT Education - 09/23/21 0930   ? ? Education Details Patellar mobs    ? Person(s) Educated Patient   ? Methods Explanation;Demonstration;Verbal cues;Tactile cues;Handout   ? Comprehension Verbalized understanding;Verbal cues required;Tactile cues required;Returned demonstration;Need further instruction   ? ?  ?  ? ?  ? ? ? PT Short Term Goals - 09/09/21 0923   ? ?  ? PT SHORT TERM GOAL #1  ? Title Patient will be independent with initial HEP   ? Status Achieved   09/09/21  ?  ? PT SHORT TERM GOAL #2  ? Title Patient will improve R knee flexion to >/= 90 dg to facilitate increased ease of mobility and transitions   ? Status Achieved   09/09/21 - R knee AROM 8-96?, with supported knee extension of 4?  ? ?  ?  ? ?  ? ? ? ? PT Long Term Goals - 09/19/21 0907   ? ?  ? PT LONG TERM GOAL #1  ? Title Patient will demonstrate independent use of ongoing/advanced HEP to facilitate ability to maintain/progress functional gains from skilled physical therapy services   ? Status Partially Met   ? Target Date 10/14/21   ?  ? PT LONG TERM GOAL #2  ? Title Patient will demonstrate R knee AROM to >/= 2-120 dg to allow for normal gait and stair mechanics   ? Status On-going   09/19/21- 4-111 deg  ? Target Date 10/14/21   ?  ? PT LONG TERM GOAL #3  ? Title Patient will demonstrate improved B LE strength to >/= 4/5 to 4+/5 for improved stability and ease of mobility   ? Status On-going   ? Target Date 10/14/21   ?  ? PT LONG TERM GOAL #4  ? Title Patient will ambulate with normal gait pattern and good gait stability w/o AD   ? Status Partially Met   ? Target Date 10/14/21   ?  ? PT LONG TERM GOAL #5  ? Title Patient able to ascend/descend stairs with reciprocal gait and no greater than single rail use with good mechanics   ? Status On-going   ? Target Date 10/14/21   ?  ? PT LONG TERM GOAL #6  ? Title Patient to report ability to perform ADLs, household, and leisure activities without limitation due to R knee pain, LOM or weakness   ? Status On-going   ? Target Date 10/14/21   ? ?  ?  ? ?  ? ? ? ? ? ? ? ?  Plan  - 09/23/21 0932   ? ? Clinical Impression Statement Angelica Davis reports pain mostly associated with stiffness after periods of inactivity such as sleeping or prolonged sitting - explained that this is normal and encouraged frequent activity/change of position during the day. TE focusing on knee extension and quad control as well as proximal stability. Pt requiring cues for proper alignment and movement pattern for many exercises. Medial knee pain noted with attempts at step-downs with decreased patellar mobility noted, therefore provided instruction in patellar glides. Deferred instruction in incisional scar mobility due to some steri-strips still present and mid incision with slight superficial gapping noted.   ? Comorbidities L TKA 12/2008, OA, HLD, diverticulosis   ? Rehab Potential Good   ? PT Frequency 2x / week   ? PT Duration 6 weeks   ? PT Treatment/Interventions ADLs/Self Care Home Management;Cryotherapy;Electrical Stimulation;Iontophoresis 66m/ml Dexamethasone;Moist Heat;DME Instruction;Gait training;Stair training;Functional mobility training;Therapeutic activities;Therapeutic exercise;Balance training;Neuromuscular re-education;Patient/family education;Manual techniques;Scar mobilization;Passive range of motion;Dry needling;Energy conservation;Taping;Vasopneumatic Device;Joint Manipulations   ? PT Next Visit Plan R knee ROM; eccentric quad strengthening ; MT and modalities PRN for pain, edema and ROM   ? PT Home Exercise Plan Access Code: [email or web address]   ? Consulted and Agree with Plan of Care Patient   ? ?  ?  ? ?  ? ? ?Patient will benefit from skilled therapeutic intervention in order to improve the following deficits and impairments:  Abnormal gait, Decreased activity tolerance, Decreased balance, Decreased knowledge of use of DME, Decreased mobility, Decreased range of motion, Decreased safety awareness, Decreased scar mobility, Decreased strength, Difficulty walking, Increased edema, Increased fascial  restricitons, Increased muscle spasms, Impaired perceived functional ability, Impaired flexibility, Improper body mechanics, Postural dysfunction, Pain ? ?Visit Diagnosis: ?Stiffness of right knee, not elsew

## 2021-09-23 NOTE — Patient Instructions (Signed)
? ?  Access Code: [email or web address] ?URL: [email or web address] ?Date: 09/23/2021 ?Prepared by: Annie Paras ? ?Exercises ?Supine Heel Slide with Strap - 2-3 x daily - 7 x weekly - 2 sets - 10 reps - 3 sec hold ?Hooklying Hamstring Stretch with Strap - 2-3 x daily - 7 x weekly - 3 reps - 30 sec hold ?Seated Hamstring Stretch with Strap - 2-3 x daily - 7 x weekly - 3 reps - 30 sec hold ?Seated Heel Slide - 2-3 x daily - 7 x weekly - 2 sets - 10 reps - 3 sec hold ?Seated Long Arc Quad - 2-3 x daily - 7 x weekly - 2 sets - 10 reps - 3 sec hold ?Gastroc Stretch on Wall - 1 x daily - 7 x weekly - 3 sets - 10 reps ?Marching with Resistance - 1 x daily - 3 x weekly - 3 sets - 10 reps ?Standing Hip Abduction with Resistance at Thighs - 1 x daily - 3 x weekly - 3 sets - 10 reps ?Standing Hip Extension with Resistance at Ankles and Counter Support - 1 x daily - 3 x weekly - 3 sets - 10 reps ?Long Sitting 4 Way Patellar Glide - 2-3 x daily - 7 x weekly - 2 sets - 10 reps ? ?

## 2021-09-26 ENCOUNTER — Ambulatory Visit: Payer: Medicare Other

## 2021-09-26 ENCOUNTER — Other Ambulatory Visit: Payer: Self-pay

## 2021-09-26 DIAGNOSIS — M25561 Pain in right knee: Secondary | ICD-10-CM

## 2021-09-26 DIAGNOSIS — M25661 Stiffness of right knee, not elsewhere classified: Secondary | ICD-10-CM

## 2021-09-26 DIAGNOSIS — R600 Localized edema: Secondary | ICD-10-CM

## 2021-09-26 DIAGNOSIS — M6281 Muscle weakness (generalized): Secondary | ICD-10-CM

## 2021-09-26 DIAGNOSIS — R2689 Other abnormalities of gait and mobility: Secondary | ICD-10-CM

## 2021-09-26 NOTE — Therapy (Signed)
[facility] ?[address] ?[place], [place], [address] ?Phone: [phone]   Fax:  [phone] ? ?Physical Therapy Treatment ? ?Patient Details  ?Name: Angelica Davis ?MRN: [id number] ?Date of Birth: 25-Jan-1941 ?Referring Provider (PT): Marchia Bond, MD ? ? ?Encounter Date: 09/26/2021 ? ? PT End of Session - 09/26/21 5102   ? ? Visit Number 8   ? Number of Visits 12   ? Date for PT Re-Evaluation 10/14/21   ? Authorization Type U[employer] Medicare   ? Progress Note Due on Visit 12   MD PN on visit #3 - 09/09/21  ? PT Start Time [phone]   ? PT Stop Time 0929   ? PT Time Calculation (min) 42 min   ? Activity Tolerance Patient tolerated treatment well   ? Behavior During Therapy [facility] for tasks assessed/performed   ? ?  ?  ? ?  ? ? ?Past Medical History:  ?Diagnosis Date  ? Arthritis   ? Colitis   ? Diverticulosis   ? History of blood transfusion   ? after birth of child  ? Hx of colonic polyps - adenomas 08/16/2002  ? Hyperlipidemia   ? Skin cancer   ? skin   ? ? ?Past Surgical History:  ?Procedure Laterality Date  ? CATARACT EXTRACTION Right 01/13/2019  ? COLONOSCOPY  last 05/12/2014  ? polyps removed  ? KNEE ARTHROSCOPY Left 06/2007  ? Dr. Noemi Chapel  ? PARTIAL KNEE ARTHROPLASTY Right 08/27/2021  ? Procedure: UNICOMPARTMENTAL KNEE;  Surgeon: Marchia Bond, MD;  Location: [facility];  Service: Orthopedics;  Laterality: Right;  ? TONSILLECTOMY    ? TOTAL KNEE ARTHROPLASTY Left 12/2008  ? Dr. Noemi Chapel  ? ? ?There were no vitals filed for this visit. ? ? Subjective Assessment - 09/26/21 0850   ? ? Subjective It still hurts at night, especially on the medial side.   ? 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 Patient Stated Goals "To be able to get back to what I'm used to doing w/o pain"   ? Currently in Pain? Yes   ? Pain Score 2    ? Pain Location Knee   ? Pain Orientation Right   ? Pain Descriptors / Indicators --   pulling  ? Pain Type Acute  pain;Surgical pain   ? ?  ?  ? ?  ? ? ? ? ? ? ? ? ? ? ? ? ? ? ? ? ? ? ? ? [place] Adult PT Treatment/Exercise - 09/26/21 0001   ? ?  ? Knee/Hip Exercises: Aerobic  ? Recumbent Bike L2 x 6 min   ?  ? Knee/Hip Exercises: Machines for Strengthening  ? Cybex Knee Extension 15lb BLE 2x10, 5lb RLE 2x10  ? Cybex Knee Flexion 25lb BLE 2x10, 10lb RLE 2x10   ?  ? Knee/Hip Exercises: Standing  ? Forward Step Up Both;10 reps;Hand Hold: 1;Step Height: 6"   ? Forward Step Up Limitations up leading with R LE, down leading with L LE   ? Step Down Right;15 reps;Hand Hold: 2;Step Height: 6"   ? Step Down Limitations lateral + backward eccentric lowering with light toe touch   ? Functional Squat 10 reps;5 seconds   ? Functional Squat Limitations mini squat with hold, no UE support   ? Other Standing Knee Exercises golfers lifts supported on R LE 1 hand support 2x10   ?  ? Manual Therapy  ?  Manual Therapy Joint mobilization   ? Manual therapy comments supine   ? Joint Mobilization R patellar mobs - all directions; tibia on femur AP mobs grade III; knee flexion mobilization   ? ?  ?  ? ?  ? ? ? ? ? ? ? ? ? ? ? ? PT Short Term Goals - 09/09/21 0923   ? ?  ? PT SHORT TERM GOAL #1  ? Title Patient will be independent with initial HEP   ? Status Achieved   09/09/21  ?  ? PT SHORT TERM GOAL #2  ? Title Patient will improve R knee flexion to >/= 90 dg to facilitate increased ease of mobility and transitions   ? Status Achieved   09/09/21 - R knee AROM 8-96?, with supported knee extension of 4?  ? ?  ?  ? ?  ? ? ? ? PT Long Term Goals - 09/19/21 0907   ? ?  ? PT LONG TERM GOAL #1  ? Title Patient will demonstrate independent use of ongoing/advanced HEP to facilitate ability to maintain/progress functional gains from skilled physical therapy services   ? Status Partially Met   ? Target Date 10/14/21   ?  ? PT LONG TERM GOAL #2  ? Title Patient will demonstrate R knee AROM to >/= 2-120 dg to allow for normal gait and stair mechanics   ? Status On-going    09/19/21- 4-111 deg  ? Target Date 10/14/21   ?  ? PT LONG TERM GOAL #3  ? Title Patient will demonstrate improved B LE strength to >/= 4/5 to 4+/5 for improved stability and ease of mobility   ? Status On-going   ? Target Date 10/14/21   ?  ? PT LONG TERM GOAL #4  ? Title Patient will ambulate with normal gait pattern and good gait stability w/o AD   ? Status Partially Met   ? Target Date 10/14/21   ?  ? PT LONG TERM GOAL #5  ? Title Patient able to ascend/descend stairs with reciprocal gait and no greater than single rail use with good mechanics   ? Status On-going   ? Target Date 10/14/21   ?  ? PT LONG TERM GOAL #6  ? Title Patient to report ability to perform ADLs, household, and leisure activities without limitation due to R knee pain, LOM or weakness   ? Status On-going   ? Target Date 10/14/21   ? ?  ?  ? ?  ? ? ? ? ? ? ? ? Plan - 09/26/21 0930   ? ? Clinical Impression Statement Pt had a lot of pain with lat eccentric step down with heel tap but with toe tap it was better tolerated. Cues with all weight bearing TE for proper body mechanics and form. Reviewed patellar mobs with her and educated on the importance of patellar mobs as her patella was hypomobile in all directions. Pt responded well to treatment.   ? Personal Factors and Comorbidities Age;Comorbidity 3+;Fitness;Past/Current Experience   ? Comorbidities L TKA 12/2008, OA, HLD, diverticulosis   ? PT Frequency 2x / week   ? PT Duration 6 weeks   ? PT Treatment/Interventions ADLs/Self Care Home Management;Cryotherapy;Electrical Stimulation;Iontophoresis 37m/ml Dexamethasone;Moist Heat;DME Instruction;Gait training;Stair training;Functional mobility training;Therapeutic activities;Therapeutic exercise;Balance training;Neuromuscular re-education;Patient/family education;Manual techniques;Scar mobilization;Passive range of motion;Dry needling;Energy conservation;Taping;Vasopneumatic Device;Joint Manipulations   ? PT Next Visit Plan R knee ROM; eccentric  quad strengthening ; MT and modalities PRN for pain, edema and ROM   ?  PT Home Exercise Plan Access Code: [email or web address]   ? Consulted and Agree with Plan of Care Patient   ? ?  ?  ? ?  ? ? ?Patient will benefit from skilled therapeutic intervention in order to improve the following deficits and impairments:  Abnormal gait, Decreased activity tolerance, Decreased balance, Decreased knowledge of use of DME, Decreased mobility, Decreased range of motion, Decreased safety awareness, Decreased scar mobility, Decreased strength, Difficulty walking, Increased edema, Increased fascial restricitons, Increased muscle spasms, Impaired perceived functional ability, Impaired flexibility, Improper body mechanics, Postural dysfunction, Pain ? ?Visit Diagnosis: ?Stiffness of right knee, not elsewhere classified ? ?Muscle weakness (generalized) ? ?Acute pain of right knee ? ?Other abnormalities of gait and mobility ? ?Localized edema ? ? ? ? ?Problem List ?Patient Active Problem List  ? Diagnosis Date Noted  ? 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 Osteoarthritis of right knee 08/06/2021  ? Pain of left hand 11/16/2019  ? Trigger finger of left hand 11/16/2019  ? Elevated lipids 09/04/2015  ? Hx of colonic polyps - adenomas 08/16/2002  ? ? ?Artist Pais, PTA ?09/26/2021, 9:41 AM ? ?[place] ?[address] ?[place], [place], [address] ?Phone: [phone]   Fax:  [phone] ? ?Name: NIKE SOUTHERS ?MRN: [id number] ?Date of Birth: [birth year] ? ? ? ?

## 2021-09-30 ENCOUNTER — Other Ambulatory Visit: Payer: Self-pay

## 2021-09-30 ENCOUNTER — Encounter: Payer: Self-pay | Admitting: Physical Therapy

## 2021-09-30 ENCOUNTER — Ambulatory Visit: Payer: Medicare Other | Admitting: Physical Therapy

## 2021-09-30 DIAGNOSIS — M25561 Pain in right knee: Secondary | ICD-10-CM

## 2021-09-30 DIAGNOSIS — M25661 Stiffness of right knee, not elsewhere classified: Secondary | ICD-10-CM | POA: Diagnosis not present

## 2021-09-30 DIAGNOSIS — R600 Localized edema: Secondary | ICD-10-CM

## 2021-09-30 DIAGNOSIS — M6281 Muscle weakness (generalized): Secondary | ICD-10-CM

## 2021-09-30 DIAGNOSIS — R2689 Other abnormalities of gait and mobility: Secondary | ICD-10-CM

## 2021-09-30 NOTE — Therapy (Signed)
[place] [facility] ?[place] ?[place], [place], [address] ?Phone: [phone]   Fax:  [phone] ? ?Physical Therapy Treatment ? ?Patient Details  ?Name: Angelica Davis ?MRN: [id number] ?Date of Birth: 11-Oct-1940 ?Referring Provider (PT): Marchia Bond, MD ? ? ?Encounter Date: 09/30/2021 ? ? PT End of Session - 09/30/21 0847   ? ? Visit Number 9   ? Number of Visits 12   ? Date for PT Re-Evaluation 10/14/21   ? Authorization Type UHC Medicare   ? Progress Note Due on Visit 12   MD PN on visit #3 - 09/09/21  ? PT Start Time [phone]   ? PT Stop Time 7517   ? PT Time Calculation (min) 45 min   ? Activity Tolerance Patient tolerated treatment well   ? Behavior During Therapy [facility] for tasks assessed/performed   ? ?  ?  ? ?  ? ? ?Past Medical History:  ?Diagnosis Date  ? Arthritis   ? Colitis   ? Diverticulosis   ? History of blood transfusion   ? after birth of child  ? Hx of colonic polyps - adenomas 08/16/2002  ? Hyperlipidemia   ? Skin cancer   ? skin   ? ? ?Past Surgical History:  ?Procedure Laterality Date  ? CATARACT EXTRACTION Right 01/13/2019  ? COLONOSCOPY  last 05/12/2014  ? polyps removed  ? KNEE ARTHROSCOPY Left 06/2007  ? Dr. Noemi Chapel  ? PARTIAL KNEE ARTHROPLASTY Right 08/27/2021  ? Procedure: UNICOMPARTMENTAL KNEE;  Surgeon: Marchia Bond, MD;  Location: [facility];  Service: Orthopedics;  Laterality: Right;  ? TONSILLECTOMY    ? TOTAL KNEE ARTHROPLASTY Left 12/2008  ? Dr. Noemi Chapel  ? ? ?There were no vitals filed for this visit. ? ? Subjective Assessment - 09/30/21 0852   ? ? Subjective Minimal to no pain this morning until she got on the bike.   ? 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 Patient Stated Goals "To be able to get back to what I'm used to doing w/o pain"   ? Currently in Pain? No/denies   ? ?  ?  ? ?  ? ? ? ? ? [facility] PT Assessment - 09/30/21 0847   ? ?  ? Assessment  ? Medical Diagnosis R unicompartmental knee  replacement   ? Referring Provider (PT) Marchia Bond, MD   ? Onset Date/Surgical Date 08/27/21   ? Next MD Visit 10/07/21   ? ?  ?  ? ?  ? ? ? ? ? ? ? ? ? ? ? ? ? ? ? ? [place] Adult PT Treatment/Exercise - 09/30/21 0017   ? ?  ? Ambulation/Gait  ? Stairs Assistance 6: Modified independent (Device/Increase time)   ? Stair Management Technique One rail Right;Alternating pattern   ? Number of Stairs 14   ? Height of Stairs 7   ? Gait Comments good reciprocal stair ascent/descent   ?  ? Knee/Hip Exercises: Aerobic  ? Recumbent Bike L2 x 6 min   ?  ? Knee/Hip Exercises: Standing  ? Hip Flexion Right;Left;10 reps;Stengthening;Knee straight   ? Hip Flexion Limitations red TB at ankle, UE support on back of chair   ? Terminal Knee Extension Right;2 sets;10 reps;Strengthening;Theraband   ? Theraband Level (Terminal Knee Extension) Level 4 (Blue)   ? Hip ADduction Right;Left;10 reps;Strengthening   ? Hip ADduction Limitations red TB at ankle, UE support on back  of chair   ? Hip Abduction Right;Left;10 reps;Stengthening;Knee straight   ? Abduction Limitations red TB at ankle, UE support on back of chair   ? Hip Extension Right;Left;10 reps;Stengthening;Knee straight   ? Extension Limitations red TB at ankle, UE support on back of chair   ? Functional Squat 10 reps;5 seconds   ? Functional Squat Limitations counter squat   ? Other Standing Knee Exercises B side-stepping and fwd/back monster walk with looped red TB at ankles 4 x 10 ft along counter   ?  ? Knee/Hip Exercises: Supine  ? Patellar Mobs Review self-mobilization of her R patella with R LE extended and supported on mat table in long-stting with small towel roll under need to help promote muscle relaxation   ?  ? Manual Therapy  ? Manual Therapy Joint mobilization   ? Joint Mobilization R patellar mobs - all directions; tibia on femur AP mobs grade III; knee flexion mobilization   ? ?  ?  ? ?  ? ? ? ? ? ? ? ? ? ? PT Education - 09/30/21 0932   ? ? Education Details HEP  progression - standing hip exercises transitioned to red TB 4-way hip and resisted stepping, added blue TB TKE   ? Person(s) Educated Patient   ? Methods Explanation;Demonstration;Verbal cues;Handout   ? Comprehension Verbalized understanding;Verbal cues required;Returned demonstration;Need further instruction   ? ?  ?  ? ?  ? ? ? PT Short Term Goals - 09/09/21 0923   ? ?  ? PT SHORT TERM GOAL #1  ? Title Patient will be independent with initial HEP   ? Status Achieved   09/09/21  ?  ? PT SHORT TERM GOAL #2  ? Title Patient will improve R knee flexion to >/= 90 dg to facilitate increased ease of mobility and transitions   ? Status Achieved   09/09/21 - R knee AROM 8-96?, with supported knee extension of 4?  ? ?  ?  ? ?  ? ? ? ? PT Long Term Goals - 09/30/21 0858   ? ?  ? PT LONG TERM GOAL #1  ? Title Patient will demonstrate independent use of ongoing/advanced HEP to facilitate ability to maintain/progress functional gains from skilled physical therapy services   ? Status Partially Met   ? Target Date 10/14/21   ?  ? PT LONG TERM GOAL #2  ? Title Patient will demonstrate R knee AROM to >/= 2-120 dg to allow for normal gait and stair mechanics   ? Status On-going   09/19/21- 4-111 deg  ? Target Date 10/14/21   ?  ? PT LONG TERM GOAL #3  ? Title Patient will demonstrate improved B LE strength to >/= 4/5 to 4+/5 for improved stability and ease of mobility   ? Status On-going   ? Target Date 10/14/21   ?  ? PT LONG TERM GOAL #4  ? Title Patient will ambulate with normal gait pattern and good gait stability w/o AD   ? Status Achieved   09/30/21  ?  ? PT LONG TERM GOAL #5  ? Title Patient able to ascend/descend stairs with reciprocal gait and no greater than single rail use with good mechanics   ? Status Achieved   09/30/21  ?  ? PT LONG TERM GOAL #6  ? Title Patient to report ability to perform ADLs, household, and leisure activities without limitation due to R knee pain, LOM or weakness   ? Status Partially  Met   09/30/21 -  limited stamina noted but otherwise no issues reported  ? Target Date 10/14/21   ? ?  ?  ? ?  ? ? ? ? ? ? ? ? Plan - 09/30/21 0932   ? ? Clinical Impression Statement Airica reports more stiffness than pain recently - some difficulty getting in/out of the car due to stiffness and slow to get started with full revolutions on bike. She is now consistently ambulating w/o an AD and is able to navigate stairs reciprocally with good step pattern. Progressed HEP to reflect recent exercise progression during therapy sessions with good tolerance and understanding.   ? Comorbidities L TKA 12/2008, OA, HLD, diverticulosis   ? PT Frequency 2x / week   ? PT Duration 6 weeks   ? PT Treatment/Interventions ADLs/Self Care Home Management;Cryotherapy;Electrical Stimulation;Iontophoresis 49m/ml Dexamethasone;Moist Heat;DME Instruction;Gait training;Stair training;Functional mobility training;Therapeutic activities;Therapeutic exercise;Balance training;Neuromuscular re-education;Patient/family education;Manual techniques;Scar mobilization;Passive range of motion;Dry needling;Energy conservation;Taping;Vasopneumatic Device;Joint Manipulations   ? PT Next Visit Plan MD/10th visit PN + FOTO (MD appt 10/07/21); R knee ROM; eccentric quad strengthening ; MT and modalities PRN for pain, edema and ROM   ? PT Home Exercise Plan Access Code: [email or web address]   ? Consulted and Agree with Plan of Care Patient   ? ?  ?  ? ?  ? ? ?Patient will benefit from skilled therapeutic intervention in order to improve the following deficits and impairments:  Abnormal gait, Decreased activity tolerance, Decreased balance, Decreased knowledge of use of DME, Decreased mobility, Decreased range of motion, Decreased safety awareness, Decreased scar mobility, Decreased strength, Difficulty walking, Increased edema, Increased fascial restricitons, Increased muscle spasms, Impaired perceived functional ability, Impaired flexibility, Improper body mechanics, Postural  dysfunction, Pain ? ?Visit Diagnosis: ?Stiffness of right knee, not elsewhere classified ? ?Muscle weakness (generalized) ? ?Acute pain of right knee ? ?Other abnormalities of gait and mobility ? ?Localized edema ? ?

## 2021-09-30 NOTE — Patient Instructions (Addendum)
? ? ? ? ? ?  Access Code: [email or web address] ?URL: [email or web address] ?Date: 09/30/2021 ?Prepared by: Annie Paras ? ?Exercises ?- Supine Heel Slide with Strap  - 2-3 x daily - 7 x weekly - 2 sets - 10 reps - 3 sec hold ?- Hooklying Hamstring Stretch with Strap  - 2-3 x daily - 7 x weekly - 3 reps - 30 sec hold ?- Seated Hamstring Stretch with Strap  - 2-3 x daily - 7 x weekly - 3 reps - 30 sec hold ?- Seated Heel Slide  - 2-3 x daily - 7 x weekly - 2 sets - 10 reps - 3 sec hold ?- Seated Long Arc Quad  - 2-3 x daily - 7 x weekly - 2 sets - 10 reps - 3 sec hold ?- Gastroc Stretch on Wall  - 1 x daily - 7 x weekly - 3 sets - 10 reps ?- Marching with Resistance  - 1 x daily - 3 x weekly - 2 sets - 10 reps ?- Long Sitting 4 Way Patellar Glide  - 2-3 x daily - 7 x weekly - 2 sets - 10 reps ?- Standing Hip Flexion with Anchored Resistance and Chair Support  - 1 x daily - 3 x weekly - 2 sets - 10 reps - 3 sec hold ?- Standing Hip Adduction with Anchored Resistance  - 1 x daily - 3 x weekly - 2 sets - 10 reps - 3 sec hold ?- Standing Hip Extension with Anchored Resistance  - 1 x daily - 3 x weekly - 2 sets - 10 reps - 3 sec hold ?- Standing Hip Abduction with Anchored Resistance  - 1 x daily - 3 x weekly - 2 sets - 10 reps - 3 sec hold ?- Standing Terminal Knee Extension with Resistance  - 1 x daily - 5 x weekly - 2 sets - 10 reps - 5 sec hold ?- Side Stepping with Resistance at Ankles and Counter Support  - 1 x daily - 3 x weekly - 2 sets - 10 reps - 3 sec hold ?- Forward and Backward Monster Walk with Resistance at Ankles and Counter Support  - 1 x daily - 3 x weekly - 2 sets - 10 reps - 3 sec hold ?

## 2021-10-03 ENCOUNTER — Encounter: Payer: Self-pay | Admitting: Physical Therapy

## 2021-10-03 ENCOUNTER — Ambulatory Visit: Payer: Medicare Other | Admitting: Physical Therapy

## 2021-10-03 DIAGNOSIS — M25561 Pain in right knee: Secondary | ICD-10-CM

## 2021-10-03 DIAGNOSIS — M25661 Stiffness of right knee, not elsewhere classified: Secondary | ICD-10-CM | POA: Diagnosis not present

## 2021-10-03 DIAGNOSIS — R600 Localized edema: Secondary | ICD-10-CM

## 2021-10-03 DIAGNOSIS — R2689 Other abnormalities of gait and mobility: Secondary | ICD-10-CM

## 2021-10-03 DIAGNOSIS — M6281 Muscle weakness (generalized): Secondary | ICD-10-CM

## 2021-10-03 NOTE — Therapy (Addendum)
[facility] ?[address] ?[place], [place], [address] ?Phone: [phone]   Fax:  [phone] ? ?Physical Therapy Treatment / Progress Note ? ?Patient Details  ?Name: Angelica Davis ?MRN: [id number] ?Date of Birth: 1940-12-12 ?Referring Provider (PT): Marchia Bond, MD ? ? ?Progress Note ? ?Reporting Period 09/02/2021 to 10/03/2021 ? ?See note below for Objective Data and Assessment of Progress/Goals.  ? ? ? ?Encounter Date: 10/03/2021 ? ? PT End of Session - 10/03/21 1022   ? ? Visit Number 10   ? Number of Visits 12   ? Date for PT Re-Evaluation 10/14/21   ? Authorization Type [employer] Medicare   ? Progress Note Due on Visit 12   MD PN on visit #10 - 10/03/21  ? PT Start Time 1022   ? PT Stop Time 1106   ? PT Time Calculation (min) 44 min   ? Activity Tolerance Patient tolerated treatment well   ? Behavior During Therapy [facility] for tasks assessed/performed   ? ?  ?  ? ?  ? ? ?Past Medical History:  ?Diagnosis Date  ? Arthritis   ? Colitis   ? Diverticulosis   ? History of blood transfusion   ? after birth of child  ? Hx of colonic polyps - adenomas 08/16/2002  ? Hyperlipidemia   ? Skin cancer   ? skin   ? ? ?Past Surgical History:  ?Procedure Laterality Date  ? CATARACT EXTRACTION Right 01/13/2019  ? COLONOSCOPY  last 05/12/2014  ? polyps removed  ? KNEE ARTHROSCOPY Left 06/2007  ? Dr. Noemi Chapel  ? PARTIAL KNEE ARTHROPLASTY Right 08/27/2021  ? Procedure: UNICOMPARTMENTAL KNEE;  Surgeon: Marchia Bond, MD;  Location: [facility];  Service: Orthopedics;  Laterality: Right;  ? TONSILLECTOMY    ? TOTAL KNEE ARTHROPLASTY Left 12/2008  ? Dr. Noemi Chapel  ? ? ?There were no vitals filed for this visit. ? ? Subjective Assessment - 10/03/21 1025   ? ? Subjective Pt reports the stiffness is getting better and minimal pain recently.   ? 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 Patient Stated Goals "To be able to get back to what I'm used to doing  w/o pain"   ? Currently in Pain? No/denies   ? ?  ?  ? ?  ? ? ? ? ? [facility] PT Assessment - 10/03/21 1022   ? ?  ? Assessment  ? Medical Diagnosis R unicompartmental knee replacement   ? Referring Provider (PT) Marchia Bond, MD   ? Onset Date/Surgical Date 08/27/21   ? Next MD Visit 10/07/21   ?  ? Observation/Other Assessments  ? Focus on Therapeutic Outcomes (FOTO)  Knee = 62 (29 point improvement from eval)   ?  ? AROM  ? Right Knee Extension 2   in sitting, 0? when supported in supine  ? Right Knee Flexion 122   ?  ? Strength  ? Right Hip Flexion 4+/5   ? Right Hip Extension 4/5   ? Right Hip External Rotation  4+/5   ? Right Hip Internal Rotation 4+/5   ? Right Hip ABduction 4/5   ? Right Hip ADduction 4/5   ? Left Hip Flexion 5/5   ? Left Hip Extension 4/5   ? Left Hip External Rotation 4+/5   ? Left Hip Internal Rotation 5/5   ? Left Hip ABduction 4/5   ? Left Hip ADduction 4/5   ?  Right Knee Flexion 4+/5   ? Right Knee Extension 4+/5   ? Left Knee Flexion 5/5   ? Left Knee Extension 5/5   ? Right Ankle Dorsiflexion 4+/5   ? Left Ankle Dorsiflexion 4+/5   ? ?  ?  ? ?  ? ? ? ? ? ? ? ? ? ? ? ? ? ? ? ? [facility] Adult PT Treatment/Exercise - 10/03/21 1022   ? ?  ? Self-Care  ? Self-Care Scar Mobilizations   ? Scar Mobilizations Instructed pt in incisional scar massage   ?  ? Knee/Hip Exercises: Stretches  ? Quad Stretch Right;3 reps;30 seconds   ? Quad Stretch Limitations prone with strap   ? Hip Flexor Stretch Right;3 reps;30 seconds   ? Hip Flexor Stretch Limitations mod thomas with strap   ?  ? Knee/Hip Exercises: Aerobic  ? Recumbent Bike L3 x 6 min   ?  ? Knee/Hip Exercises: Standing  ? Terminal Knee Extension Right;2 sets;10 reps;Strengthening;Theraband   ? Theraband Level (Terminal Knee Extension) Level 4 (Blue)   ?  ? Knee/Hip Exercises: Supine  ? Patellar Mobs Review self-mobilization of her R patella with R LE extended and supported on mat table in long-stting with small towel roll under need to help promote  muscle relaxation   ?  ? Manual Therapy  ? Manual Therapy Joint mobilization;Soft tissue mobilization   ? Joint Mobilization R patellar mobs - all directions   ? Soft tissue mobilization R knee incisional scar massage   ? Muscle Energy Technique Contract/relax into R knee flexion with pt's thigh supported over PT's arm   ? ?  ?  ? ?  ? ? ? ? ? ? ? ? ? ? ? ? PT Short Term Goals - 09/09/21 0923   ? ?  ? PT SHORT TERM GOAL #1  ? Title Patient will be independent with initial HEP   ? Status Achieved   09/09/21  ?  ? PT SHORT TERM GOAL #2  ? Title Patient will improve R knee flexion to >/= 90 dg to facilitate increased ease of mobility and transitions   ? Status Achieved   09/09/21 - R knee AROM 8-96?, with supported knee extension of 4?  ? ?  ?  ? ?  ? ? ? ? PT Long Term Goals - 10/03/21 1028   ? ?  ? PT LONG TERM GOAL #1  ? Title Patient will demonstrate independent use of ongoing/advanced HEP to facilitate ability to maintain/progress functional gains from skilled physical therapy services   ? Status Partially Met   10/03/21 - met for current HEP  ? Target Date 10/14/21   ?  ? PT LONG TERM GOAL #2  ? Title Patient will demonstrate R knee AROM to >/= 2-120 dg to allow for normal gait and stair mechanics   ? Status Achieved   10/03/21 - R knee AROM 2-122?, 0? extension when supported in supine  ? Target Date --   ?  ? PT LONG TERM GOAL #3  ? Title Patient will demonstrate improved B LE strength to >/= 4/5 to 4+/5 for improved stability and ease of mobility   ? Status Achieved   10/03/21 - greatest weakness still proximally at hips but all MMT 4/5 to 5/5 t/o B LE  ? Target Date --   ?  ? PT LONG TERM GOAL #4  ? Title Patient will ambulate with normal gait pattern and good gait stability w/o AD   ?  Status Achieved   09/30/21  ?  ? PT LONG TERM GOAL #5  ? Title Patient able to ascend/descend stairs with reciprocal gait and no greater than single rail use with good mechanics   ? Status Achieved   09/30/21  ?  ? PT LONG TERM GOAL #6   ? Title Patient to report ability to perform ADLs, household, and leisure activities without limitation due to R knee pain, LOM or weakness   ? Status Partially Met   09/30/21 - limited stamina noted but otherwise no issues reported  ? Target Date 10/14/21   ? ?  ?  ? ?  ? ? ? ? ? ? ? ? Plan - 10/03/21 1106   ? ? Clinical Impression Statement Chamia is progressing well with PT s/p R unicompartmental knee replacement on 08/27/21. Pain is improving with no pain, just stiffness noted on past 2 PT visits. Her R knee AROM is now 2-122? with 0? extension available when knee supported in supine. Overall B strength has improved with MMT now 4/5 to 5/5 - greatest remaining weakness proximally at hips but HEP recently updated to continue to target proximal strengthening. She is now ambulating w/o an AD and is able to navigate stairs reciprocally with good step pattern. Majority of goals met or nearly met. She has 2 scheduled visits remaining in her current POC and is on track to transition to her HEP at the end of the current episode.   ? Comorbidities L TKA 12/2008, OA, HLD, diverticulosis   ? Rehab Potential Good   ? PT Frequency 2x / week   ? PT Duration 6 weeks   ? PT Treatment/Interventions ADLs/Self Care Home Management;Cryotherapy;Electrical Stimulation;Iontophoresis 19m/ml Dexamethasone;Moist Heat;DME Instruction;Gait training;Stair training;Functional mobility training;Therapeutic activities;Therapeutic exercise;Balance training;Neuromuscular re-education;Patient/family education;Manual techniques;Scar mobilization;Passive range of motion;Dry needling;Energy conservation;Taping;Vasopneumatic Device;Joint Manipulations   ? PT Next Visit Plan R knee ROM; B hip and R eccentric quad strengthening ; MT and modalities PRN for pain, edema and ROM   ? PT Home Exercise Plan Access Code: [email or web address]   ? Consulted and Agree with Plan of Care Patient   ? ?  ?  ? ?  ? ? ?Patient will benefit from skilled therapeutic intervention in  order to improve the following deficits and impairments:  Abnormal gait, Decreased activity tolerance, Decreased balance, Decreased knowledge of use of DME, Decreased mobility, Decreased range of motion, Dec

## 2021-10-10 ENCOUNTER — Ambulatory Visit: Payer: Medicare Other | Attending: Orthopedic Surgery

## 2021-10-10 DIAGNOSIS — R600 Localized edema: Secondary | ICD-10-CM | POA: Diagnosis present

## 2021-10-10 DIAGNOSIS — M6281 Muscle weakness (generalized): Secondary | ICD-10-CM | POA: Insufficient documentation

## 2021-10-10 DIAGNOSIS — M25561 Pain in right knee: Secondary | ICD-10-CM | POA: Diagnosis present

## 2021-10-10 DIAGNOSIS — M25661 Stiffness of right knee, not elsewhere classified: Secondary | ICD-10-CM | POA: Diagnosis present

## 2021-10-10 DIAGNOSIS — R2689 Other abnormalities of gait and mobility: Secondary | ICD-10-CM | POA: Insufficient documentation

## 2021-10-10 NOTE — Patient Instructions (Signed)
Access Code: [email or web address] ?URL: [email or web address] ?Date: 10/10/2021 ?Prepared by: Clarene Essex ? ?Exercises ?- Supine Quadriceps Stretch with Strap on Table  - 2-3 x daily - 7 x weekly - 2 sets - 2 reps - 30 sec hold ?- Supine Heel Slide with Strap  - 2-3 x daily - 7 x weekly - 2 sets - 10 reps - 3 sec hold ?- Seated Hamstring Stretch with Strap  - 2-3 x daily - 7 x weekly - 3 reps - 30 sec hold ?- Gastroc Stretch on Wall  - 1 x daily - 7 x weekly - 3 sets - 10 reps ?- Marching with Resistance  - 1 x daily - 3 x weekly - 2 sets - 10 reps ?- Long Sitting 4 Way Patellar Glide  - 2-3 x daily - 7 x weekly - 2 sets - 10 reps ?- Standing Hip Flexion with Anchored Resistance and Chair Support  - 1 x daily - 3 x weekly - 2 sets - 10 reps - 3 sec hold ?- Standing Hip Adduction with Anchored Resistance  - 1 x daily - 3 x weekly - 2 sets - 10 reps - 3 sec hold ?- Standing Hip Extension with Anchored Resistance  - 1 x daily - 3 x weekly - 2 sets - 10 reps - 3 sec hold ?- Standing Hip Abduction with Anchored Resistance  - 1 x daily - 3 x weekly - 2 sets - 10 reps - 3 sec hold ?- Standing Terminal Knee Extension with Resistance  - 1 x daily - 5 x weekly - 2 sets - 10 reps - 5 sec hold ?- Side Stepping with Resistance at Ankles and Counter Support  - 1 x daily - 3 x weekly - 2 sets - 10 reps - 3 sec hold ?- Forward and Backward Monster Walk with Resistance at Ankles and Counter Support  - 1 x daily - 3 x weekly - 2 sets - 10 reps - 3 sec hold ?- Lateral Step Down  - 1 x daily - 3 x weekly - 2-3 sets - 10 reps ?

## 2021-10-10 NOTE — Therapy (Signed)
[facility] ?[address] ?[place], [place], [address] ?Phone: [phone]   Fax:  [phone] ? ?Physical Therapy Treatment ? ?Patient Details  ?Name: Angelica Davis ?MRN: [id number] ?Date of Birth: 1941/01/02 ?Referring Provider (PT): Marchia Bond, MD ? ? ?Encounter Date: 10/10/2021 ? ? PT End of Session - 10/10/21 1025   ? ? Visit Number 11   ? Number of Visits 12   ? Date for PT Re-Evaluation 10/14/21   ? Authorization Type [employer] Medicare   ? Progress Note Due on Visit 12   MD PN on visit #10 - 10/03/21  ? PT Start Time 8563   ? PT Stop Time 1020   ? PT Time Calculation (min) 48 min   ? Activity Tolerance Patient tolerated treatment well   ? Behavior During Therapy [facility] for tasks assessed/performed   ? ?  ?  ? ?  ? ? ?Past Medical History:  ?Diagnosis Date  ? Arthritis   ? Colitis   ? Diverticulosis   ? History of blood transfusion   ? after birth of child  ? Hx of colonic polyps - adenomas 08/16/2002  ? Hyperlipidemia   ? Skin cancer   ? skin   ? ? ?Past Surgical History:  ?Procedure Laterality Date  ? CATARACT EXTRACTION Right 01/13/2019  ? COLONOSCOPY  last 05/12/2014  ? polyps removed  ? KNEE ARTHROSCOPY Left 06/2007  ? Dr. Noemi Chapel  ? PARTIAL KNEE ARTHROPLASTY Right 08/27/2021  ? Procedure: UNICOMPARTMENTAL KNEE;  Surgeon: Marchia Bond, MD;  Location: [facility];  Service: Orthopedics;  Laterality: Right;  ? TONSILLECTOMY    ? TOTAL KNEE ARTHROPLASTY Left 12/2008  ? Dr. Noemi Chapel  ? ? ?There were no vitals filed for this visit. ? ? Subjective Assessment - 10/10/21 0936   ? ? Subjective Pt reports noticing pain with going down steps.   ? 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 Patient Stated Goals "To be able to get back to what I'm used to doing w/o pain"   ? Currently in Pain? No/denies   ? ?  ?  ? ?  ? ? ? ? ? ? ? ? ? ? ? ? ? ? ? ? ? ? ? ? [facility] Adult PT Treatment/Exercise - 10/10/21 0001   ? ?  ? Knee/Hip Exercises:  Stretches  ? Hip Flexor Stretch Right;2 reps;30 seconds   ? Hip Flexor Stretch Limitations mod thomas with strap   ?  ? Knee/Hip Exercises: Aerobic  ? Recumbent Bike L3 x 6 min   ?  ? Knee/Hip Exercises: Standing  ? Hip Flexion Stengthening;Right;10 reps;Knee straight;Knee bent   ? Hip Flexion Limitations GTB achored to door   ? Hip ADduction Strengthening;Right;10 reps   ? Hip ADduction Limitations GTB anchored to door   ? Hip Abduction Stengthening;Right;10 reps;Knee straight   ? Abduction Limitations GTB anchored to doorframe   ? Hip Extension Stengthening;Right;10 reps;Knee straight   ? Extension Limitations GTB achored to doorframe   ? Step Down Right;10 reps;2 sets;Hand Hold: 1;Step Height: 8"   ? Step Down Limitations lateral step downs   ? Other Standing Knee Exercises B side-stepping and fwd/back monster walk with looped green TB at ankles 2 x 20 ft   ? ?  ?  ? ?  ? ? ? ? ? ? ? ? ? ? PT Education - 10/10/21 1024   ? ? Education Details  HEP progression- lat step downs and supine quad stretch   ? Person(s) Educated Patient   ? Methods Explanation;Demonstration;Verbal cues;Handout   ? Comprehension Verbalized understanding;Returned demonstration;Verbal cues required   ? ?  ?  ? ?  ? ? ? PT Short Term Goals - 09/09/21 0923   ? ?  ? PT SHORT TERM GOAL #1  ? Title Patient will be independent with initial HEP   ? Status Achieved   09/09/21  ?  ? PT SHORT TERM GOAL #2  ? Title Patient will improve R knee flexion to >/= 90 dg to facilitate increased ease of mobility and transitions   ? Status Achieved   09/09/21 - R knee AROM 8-96?, with supported knee extension of 4?  ? ?  ?  ? ?  ? ? ? ? PT Long Term Goals - 10/03/21 1028   ? ?  ? PT LONG TERM GOAL #1  ? Title Patient will demonstrate independent use of ongoing/advanced HEP to facilitate ability to maintain/progress functional gains from skilled physical therapy services   ? Status Partially Met   10/03/21 - met for current HEP  ? Target Date 10/14/21   ?  ? PT LONG  TERM GOAL #2  ? Title Patient will demonstrate R knee AROM to >/= 2-120 dg to allow for normal gait and stair mechanics   ? Status Achieved   10/03/21 - R knee AROM 2-122?, 0? extension when supported in supine  ? Target Date --   ?  ? PT LONG TERM GOAL #3  ? Title Patient will demonstrate improved B LE strength to >/= 4/5 to 4+/5 for improved stability and ease of mobility   ? Status Achieved   10/03/21 - greatest weakness still proximally at hips but all MMT 4/5 to 5/5 t/o B LE  ? Target Date --   ?  ? PT LONG TERM GOAL #4  ? Title Patient will ambulate with normal gait pattern and good gait stability w/o AD   ? Status Achieved   09/30/21  ?  ? PT LONG TERM GOAL #5  ? Title Patient able to ascend/descend stairs with reciprocal gait and no greater than single rail use with good mechanics   ? Status Achieved   09/30/21  ?  ? PT LONG TERM GOAL #6  ? Title Patient to report ability to perform ADLs, household, and leisure activities without limitation due to R knee pain, LOM or weakness   ? Status Partially Met   09/30/21 - limited stamina noted but otherwise no issues reported  ? Target Date 10/14/21   ? ?  ?  ? ?  ? ? ? ? ? ? ? ? Plan - 10/10/21 1028   ? ? Clinical Impression Statement Today was focused on fine tuning HEP in hopes for D/C next visit. Pt was able to progress to increased resistance with standing 4 way hip and band walks. Also added supine quad stretch as she has yet to have this added to HEP yet. Pt now noting the biggest obstacle is descending stairs but demonstrates today being able to descend an 8' step w/o much compensation but added lateral step downs to HEP with instruction to use handrail. Pt has one more visit, she is still on track with preparing to transition to HEP.   ? Comorbidities L TKA 12/2008, OA, HLD, diverticulosis   ? PT Frequency 2x / week   ? PT Duration 6 weeks   ? PT Treatment/Interventions  ADLs/Self Care Home Management;Cryotherapy;Electrical Stimulation;Iontophoresis 30m/ml  Dexamethasone;Moist Heat;DME Instruction;Gait training;Stair training;Functional mobility training;Therapeutic activities;Therapeutic exercise;Balance training;Neuromuscular re-education;Patient/family education;Manual techniques;Scar mobilization;Passive range of motion;Dry needling;Energy conservation;Taping;Vasopneumatic Device;Joint Manipulations   ? PT Next Visit Plan 30 day hold from PT   ? PT Home Exercise Plan Access Code: [email or web address]   ? Consulted and Agree with Plan of Care Patient   ? ?  ?  ? ?  ? ? ?Patient will benefit from skilled therapeutic intervention in order to improve the following deficits and impairments:  Abnormal gait, Decreased activity tolerance, Decreased balance, Decreased knowledge of use of DME, Decreased mobility, Decreased range of motion, Decreased safety awareness, Decreased scar mobility, Decreased strength, Difficulty walking, Increased edema, Increased fascial restricitons, Increased muscle spasms, Impaired perceived functional ability, Impaired flexibility, Improper body mechanics, Postural dysfunction, Pain ? ?Visit Diagnosis: ?Stiffness of right knee, not elsewhere classified ? ?Muscle weakness (generalized) ? ?Acute pain of right knee ? ?Other abnormalities of gait and mobility ? ?Localized edema ? ? ? ? ?Problem List ?Patient Active Problem List  ? Diagnosis Date Noted  ? 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 Osteoarthritis of right knee 08/06/2021  ? Pain of left hand 11/16/2019  ? Trigger finger of left hand 11/16/2019  ? Elevated lipids 09/04/2015  ? Hx of colonic polyps - adenomas 08/16/2002  ? ? ?BArtist Pais PTA ?10/10/2021, 10:37 AM ? ?[facility] [place] [facility] ?[address]?[place] [address]?Phone: [phone]  Fax:  [phone]? ?Name: LTAWNEY VANORMAN?MRN: [id number]?Date of Birth: 201-11-1940? ? ? ?

## 2021-10-14 ENCOUNTER — Ambulatory Visit: Payer: Medicare Other | Admitting: Physical Therapy

## 2021-10-14 ENCOUNTER — Encounter: Payer: Self-pay | Admitting: Physical Therapy

## 2021-10-14 DIAGNOSIS — R600 Localized edema: Secondary | ICD-10-CM

## 2021-10-14 DIAGNOSIS — M25661 Stiffness of right knee, not elsewhere classified: Secondary | ICD-10-CM

## 2021-10-14 DIAGNOSIS — M6281 Muscle weakness (generalized): Secondary | ICD-10-CM

## 2021-10-14 DIAGNOSIS — M25561 Pain in right knee: Secondary | ICD-10-CM

## 2021-10-14 DIAGNOSIS — R2689 Other abnormalities of gait and mobility: Secondary | ICD-10-CM

## 2021-10-14 NOTE — Therapy (Signed)
[facility] ?[place], [place], [address] ?Phone: [phone]   Fax:  [phone] ? ?Physical Therapy Treatment / Discharge Summary ? ?Patient Details  ?Name: Angelica Davis ?MRN: [id number] ?Date of Birth: 28-Feb-1941 ?Referring Provider (PT): Angelica Bond, MD ? ? ?Encounter Date: 10/14/2021 ? ? PT End of Session - 10/14/21 1101   ? ? Visit Number 12   ? Number of Visits 12   ? Date for PT Re-Evaluation 10/14/21   ? Authorization Type [employer] Medicare   ? Progress Note Due on Visit --   ? PT Start Time 1101   ? PT Stop Time 1135   ? PT Time Calculation (min) 34 min   ? Activity Tolerance Patient tolerated treatment well   ? Behavior During Therapy Angelica Davis for tasks assessed/performed   ? ?  ?  ? ?  ? ? ?Past Medical History:  ?Diagnosis Date  ? Arthritis   ? Colitis   ? Diverticulosis   ? History of blood transfusion   ? after birth of child  ? Hx of colonic polyps - adenomas 08/16/2002  ? Hyperlipidemia   ? Skin cancer   ? skin   ? ? ?Past Surgical History:  ?Procedure Laterality Date  ? CATARACT EXTRACTION Right 01/13/2019  ? COLONOSCOPY  last 05/12/2014  ? polyps removed  ? KNEE ARTHROSCOPY Left 06/2007  ? Dr. Noemi Davis  ? PARTIAL KNEE ARTHROPLASTY Right 08/27/2021  ? Procedure: UNICOMPARTMENTAL KNEE;  Surgeon: Angelica Bond, MD;  Location: [facility];  Service: Orthopedics;  Laterality: Right;  ? TONSILLECTOMY    ? TOTAL KNEE ARTHROPLASTY Left 12/2008  ? Dr. Noemi Davis  ? ? ?There were no vitals filed for this visit. ? ? Subjective Assessment - 10/14/21 1106   ? ? Subjective Pt feels comfortable with plan to transition to her HEP as planned today and knows she will need to keep up with her HEP.   ? 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 Patient Stated Goals "To be able to get back to what I'm used to doing w/o pain"   ? Currently in Pain? No/denies   ? ?  ?  ? ?  ? ? ? ? ? [facility] PT Assessment - 10/14/21 1101   ? ?  ?  Assessment  ? Medical Diagnosis R unicompartmental knee replacement   ? Referring Provider (PT) Angelica Bond, MD   ? Onset Date/Surgical Date 08/27/21   ? Next MD Visit ~11/04/21   ?  ? Observation/Other Assessments  ? Focus on Therapeutic Outcomes (FOTO)  Knee = 65 (32 point improvement from eval)   ?  ? AROM  ? Right Knee Extension 0   ? Right Knee Flexion 123   ?  ? Strength  ? Right Hip Flexion 5/5   ? Right Hip Extension 4+/5   ? Right Hip External Rotation  4+/5   ? Right Hip Internal Rotation 5/5   ? Right Hip ABduction 4+/5   ? Right Hip ADduction 4+/5   ? Left Hip Flexion 5/5   ? Left Hip Extension 4+/5   ? Left Hip External Rotation 4+/5   ? Left Hip Internal Rotation 5/5   ? Left Hip ABduction 4+/5   ? Left Hip ADduction 4+/5   ? Right Knee Flexion 5/5   ? Right Knee Extension 5/5   ? Left Knee Flexion 5/5   ? Left Knee Extension  5/5   ? Right Ankle Dorsiflexion 4+/5   ? Left Ankle Dorsiflexion 5/5   ? ?  ?  ? ?  ? ? ? ? ? ? ? ? ? ? ? ? ? ? ? ? [facility] PT Treatment/Exercise - 10/14/21 1101   ? ?  ? Knee/Hip Exercises: Aerobic  ? Recumbent Bike L3 x 6 min   ? ?  ?  ? ?  ? ? ? ? ? ? ? ? ? ? PT Education - 10/14/21 1130   ? ? Education Details Review of recommended maintenance level frequency for ongoing HEP - initially 3-5x/wk weaning to 3x/wk and continuing HEP for at least another 4-6 weeks   ? Person(s) Educated Patient   ? Methods Explanation   ? Comprehension Verbalized understanding   ? ?  ?  ? ?  ? ? ? PT Short Term Goals - 09/09/21 0923   ? ?  ? PT SHORT TERM GOAL #1  ? Title Patient will be independent with initial HEP   ? Status Achieved   09/09/21  ?  ? PT SHORT TERM GOAL #2  ? Title Patient will improve R knee flexion to >/= 90 dg to facilitate increased ease of mobility and transitions   ? Status Achieved   09/09/21 - R knee AROM 8-96?, with supported knee extension of 4?  ? ?  ?  ? ?  ? ? ? ? PT Long Term Goals - 10/14/21 1108   ? ?  ? PT LONG TERM GOAL #1  ? Title Patient will demonstrate  independent use of ongoing/advanced HEP to facilitate ability to maintain/progress functional gains from skilled physical therapy services   ? Status Achieved   10/14/21  ?  ? PT LONG TERM GOAL #2  ? Title Patient will demonstrate R knee AROM to >/= 2-120 dg to allow for normal gait and stair mechanics   ? Status Achieved   10/14/21 - R knee 0-123?  ?  ? PT LONG TERM GOAL #3  ? Title Patient will demonstrate improved B LE strength to >/= 4/5 to 4+/5 for improved stability and ease of mobility   ? Status Achieved   10/14/21 - mild weakness still proximally at hips but all MMT 4+/5 to 5/5 t/o B LE  ?  ? PT LONG TERM GOAL #4  ? Title Patient will ambulate with normal gait pattern and good gait stability w/o AD   ? Status Achieved   09/30/21  ?  ? PT LONG TERM GOAL #5  ? Title Patient able to ascend/descend stairs with reciprocal gait and no greater than single rail use with good mechanics   ? Status Achieved   09/30/21  ?  ? PT LONG TERM GOAL #6  ? Title Patient to report ability to perform ADLs, household, and leisure activities without limitation due to R knee pain, LOM or weakness   ? Status Achieved   10/14/21 - Pt reports her stamina has improved and no longer has any issues wtih daily activities including cleaning house  ? ?  ?  ? ?  ? ? ? ? ? ? ? ? Plan - 10/14/21 1135   ? ? Clinical Impression Statement Angelica Davis is pleased with her progress following R unicompartmental knee replacement on 08/27/21, noting no pain (just stiffness) and improving stamina with no limitations with daily activities other than difficulty getting down on her knee - cautioned pt to avoid excessive kneeling and to make sure  her knee is well padded if she does have to put pressure on her anterior knee. She is walking normally w/o an AD and is able to navigate stairs reciprocally with good step pattern. Her R knee AROM is now 0-123? and B LE strength is now 4+/5 to 5/5. All PT goals now met and pt feels confident with the plan to transition to her  HEP at this time, verbalizing understanding of recommended frequency for the ongoing HEP, therefore will proceed with discharge from PT.   ? Comorbidities L TKA 12/2008, OA, HLD, diverticulosis   ? PT Treatment/Interventions ADLs/Self Care Home Management;Cryotherapy;Electrical Stimulation;Iontophoresis 40m/ml Dexamethasone;Moist Heat;DME Instruction;Gait training;Stair training;Functional mobility training;Therapeutic activities;Therapeutic exercise;Balance training;Neuromuscular re-education;Patient/family education;Manual techniques;Scar mobilization;Passive range of motion;Dry needling;Energy conservation;Taping;Vasopneumatic Device;Joint Manipulations   ? PT Next Visit Plan transition to HEP + discharge from PT   ? PT Home Exercise Plan Access Code: [email or web address]   ? Consulted and Agree with Plan of Care Patient   ? ?  ?  ? ?  ? ? ?Patient will benefit from skilled therapeutic intervention in order to improve the following deficits and impairments:  Abnormal gait, Decreased activity tolerance, Decreased balance, Decreased knowledge of use of DME, Decreased mobility, Decreased range of motion, Decreased safety awareness, Decreased scar mobility, Decreased strength, Difficulty walking, Increased edema, Increased fascial restricitons, Increased muscle spasms, Impaired perceived functional ability, Impaired flexibility, Improper body mechanics, Postural dysfunction, Pain ? ?Visit Diagnosis: ?Stiffness of right knee, not elsewhere classified ? ?Muscle weakness (generalized) ? ?Acute pain of right knee ? ?Other abnormalities of gait and mobility ? ?Localized edema ? ? ? ? ?Problem List ?Patient Active Problem List  ? Diagnosis Date Noted  ? 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 Osteoarthritis of right knee 08/06/2021  ? Pain of left hand 11/16/2019  ? Trigger finger of left hand 11/16/2019  ? Elevated lipids 09/04/2015  ? Hx of colonic polyps - adenomas 08/16/2002  ? ? ? ?PHYSICAL THERAPY DISCHARGE  SUMMARY ? ?Visits from Start of Care: 12 ? ?Current functional level related to goals / functional outcomes: ?  Refer to above clinical impression. ?  ?Remaining deficits: ?  As above. ?  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

## 2022-04-21 ENCOUNTER — Ambulatory Visit (HOSPITAL_BASED_OUTPATIENT_CLINIC_OR_DEPARTMENT_OTHER): Payer: Medicare Other | Admitting: Obstetrics & Gynecology

## 2022-04-28 ENCOUNTER — Encounter: Payer: Self-pay | Admitting: *Deleted

## 2022-05-12 ENCOUNTER — Encounter: Payer: Self-pay | Admitting: Internal Medicine

## 2022-05-27 ENCOUNTER — Ambulatory Visit (AMBULATORY_SURGERY_CENTER): Payer: Self-pay

## 2022-05-27 VITALS — Ht 62.0 in | Wt 226.0 lb

## 2022-05-27 DIAGNOSIS — Z800 Family history of malignant neoplasm of digestive organs: Secondary | ICD-10-CM

## 2022-05-27 DIAGNOSIS — Z86010 Personal history of colonic polyps: Secondary | ICD-10-CM

## 2022-05-27 NOTE — Progress Notes (Signed)
No egg or soy allergy known to patient   No issues known to pt with past sedation  with any surgeries or procedures  Patient denies ever being told they had issues or difficulty with intubation   No FH of Malignant Hyperthermia  Pt is not on diet pills  Pt is not on  home 02   Pt is not on blood thinners   Pt denies issues with constipation   No A fib or A flutter  Have any cardiac testing pending--no  Miralax prep used

## 2022-06-18 ENCOUNTER — Encounter: Payer: Self-pay | Admitting: Internal Medicine

## 2022-06-23 ENCOUNTER — Encounter: Payer: Self-pay | Admitting: Certified Registered Nurse Anesthetist

## 2022-06-24 ENCOUNTER — Ambulatory Visit (AMBULATORY_SURGERY_CENTER): Payer: Medicare Other | Admitting: Internal Medicine

## 2022-06-24 ENCOUNTER — Encounter: Payer: Self-pay | Admitting: Internal Medicine

## 2022-06-24 VITALS — BP 134/68 | HR 74 | Temp 97.7°F | Resp 13 | Ht 62.0 in | Wt 226.0 lb

## 2022-06-24 DIAGNOSIS — Z86010 Personal history of colonic polyps: Secondary | ICD-10-CM

## 2022-06-24 DIAGNOSIS — Z800 Family history of malignant neoplasm of digestive organs: Secondary | ICD-10-CM

## 2022-06-24 DIAGNOSIS — Z09 Encounter for follow-up examination after completed treatment for conditions other than malignant neoplasm: Secondary | ICD-10-CM | POA: Diagnosis not present

## 2022-06-24 DIAGNOSIS — K635 Polyp of colon: Secondary | ICD-10-CM

## 2022-06-24 DIAGNOSIS — D123 Benign neoplasm of transverse colon: Secondary | ICD-10-CM

## 2022-06-24 MED ORDER — SODIUM CHLORIDE 0.9 % IV SOLN: 500.0000 mL | Freq: Once | INTRAVENOUS | Status: DC

## 2022-06-24 NOTE — Op Note (Addendum)
[facility] Patient Name: Angelica Davis Procedure Date: 06/24/2022 9:25 AM MRN: [id number] Endoscopist: Gatha Mayer , MD, [id number] Age: 81 Referring MD:  Date of Birth: July 16, 1940 Gender: Female Account #: [id number] Procedure:                Colonoscopy Indications:              Surveillance: Personal history of adenomatous                            polyps on last colonoscopy 3 years ago, Last                            colonoscopy: 2020 Medicines:                Monitored Anesthesia Care Procedure:                Pre-Anesthesia Assessment:                           - Prior to the procedure, a History and Physical                            was performed, and patient medications and                            allergies were reviewed. The patient's tolerance of                            previous anesthesia was also reviewed. The risks                            and benefits of the procedure and the sedation                            options and risks were discussed with the patient.                            All questions were answered, and informed consent                            was obtained. Prior Anticoagulants: The patient has                            taken no anticoagulant or antiplatelet agents. ASA                            Grade Assessment: I - A normal, healthy patient.                            After reviewing the risks and benefits, the patient                            was deemed in satisfactory condition to undergo the  procedure.                           After obtaining informed consent, the colonoscope                            was passed under direct vision. Throughout the                            procedure, the patient's blood pressure, pulse, and                            oxygen saturations were monitored continuously. The                            Olympus PCF-H190DL [id number]) Colonoscope was                             introduced through the anus and advanced to the the                            cecum, identified by appendiceal orifice and                            ileocecal valve. The colonoscopy was performed                            without difficulty. The patient tolerated the                            procedure well. The quality of the bowel                            preparation was good. The ileocecal valve,                            appendiceal orifice, and rectum were photographed.                            The bowel preparation used was Miralax via split                            dose instruction. Scope In: 9:29:41 AM Scope Out: 9:45:36 AM Scope Withdrawal Time: 0 hours 9 minutes 56 seconds  Total Procedure Duration: 0 hours 15 minutes 55 seconds  Findings:                 The perianal and digital rectal examinations were                            normal.                           A 5 mm polyp was found in the proximal transverse  colon. The polyp was sessile. The polyp was removed                            with a cold snare. Resection and retrieval were                            complete. Verification of patient identification                            for the specimen was done. Estimated blood loss was                            minimal.                           Multiple diverticula were found in the sigmoid                            colon, descending colon and transverse colon.                           The exam was otherwise without abnormality on                            direct and retroflexion views. Complications:            No immediate complications. Estimated Blood Loss:     Estimated blood loss was minimal. Impression:               - One 5 mm polyp in the proximal transverse colon,                            removed with a cold snare. Resected and retrieved.                           - Diverticulosis in the sigmoid colon, in  the                            descending colon and in the transverse colon.                           - The examination was otherwise normal on direct                            and retroflexion views.                           - Personal history of colonic polyps. Also FHx CRCA                            elderly mom and polyps father                           2004 12 mm adenoma  2010 diminutive adenoma                           2015 - diminutive transverse polyp - adenoma -                           05/10/2019 4 diminutive polyps adenomas and ssp's - Recommendation:           - Patient has a contact number available for                            emergencies. The signs and symptoms of potential                            delayed complications were discussed with the                            patient. Return to normal activities tomorrow.                            Written discharge instructions were provided to the                            patient.                           - Resume previous diet.                           - Continue present medications.                           - No repeat colonoscopy due to age. Gatha Mayer, MD 06/24/2022 9:53:26 AM This report has been signed electronically.

## 2022-06-24 NOTE — Progress Notes (Signed)
Report given to PACU, vss 

## 2022-06-24 NOTE — Progress Notes (Signed)
VS completed by CW.   Pt's states no medical or surgical changes since previsit or office visit.  

## 2022-06-24 NOTE — Progress Notes (Signed)
Called to room to assist during endoscopic procedure.  Patient ID and intended procedure confirmed with present staff. Received instructions for my participation in the procedure from the performing physician.  

## 2022-06-24 NOTE — Patient Instructions (Addendum)
One tiny polyp removed today.  You still have diverticulosis - thickened muscle rings and pouches in the colon wall. Please read the handout about this condition.  Not planning on any more routine colonoscopy tests. Should you have problems (hope not) we could do one.  I appreciate the opportunity to care for you. Gatha Mayer, MD, [facility]  Handouts given for diverticulosis and polyps.   YOU HAD AN ENDOSCOPIC PROCEDURE TODAY AT [facility]:   Refer to the procedure report that was given to you for any specific questions about what was found during the examination.  If the procedure report does not answer your questions, please call your gastroenterologist to clarify.  If you requested that your care partner not be given the details of your procedure findings, then the procedure report has been included in a sealed envelope for you to review at your convenience later.  YOU SHOULD EXPECT: Some feelings of bloating in the abdomen. Passage of more gas than usual.  Walking can help get rid of the air that was put into your GI tract during the procedure and reduce the bloating. If you had a lower endoscopy (such as a colonoscopy or flexible sigmoidoscopy) you may notice spotting of blood in your stool or on the toilet paper. If you underwent a bowel prep for your procedure, you may not have a normal bowel movement for a few days.  Please Note:  You might notice some irritation and congestion in your nose or some drainage.  This is from the oxygen used during your procedure.  There is no need for concern and it should clear up in a day or so.  SYMPTOMS TO REPORT IMMEDIATELY:  Following lower endoscopy (colonoscopy):  Excessive amounts of blood in the stool  Significant tenderness or worsening of abdominal pains  Swelling of the abdomen that is new, acute  Fever of 100F or higher  For urgent or emergent issues, a gastroenterologist can be reached at any hour by calling [phone]  [phone]. Do not use MyChart messaging for urgent concerns.    DIET:  We do recommend a small meal at first, but then you may proceed to your regular diet.  Drink plenty of fluids but you should avoid alcoholic beverages for 24 hours.  ACTIVITY:  You should plan to take it easy for the rest of today and you should NOT DRIVE or use heavy machinery until tomorrow (because of the sedation medicines used during the test).    FOLLOW UP: Our staff will call the number listed on your records the next business day following your procedure.  We will call around 7:15- 8:00 am to check on you and address any questions or concerns that you may have regarding the information given to you following your procedure. If we do not reach you, we will leave a message.     If any biopsies were taken you will be contacted by phone or by letter within the next 1-3 weeks.  Please call us at [phone] if you have not heard about the biopsies in 3 weeks.    SIGNATURES/CONFIDENTIALITY: You and/or your care partner have signed paperwork which will be entered into your electronic medical record.  These signatures attest to the fact that that the information above on your After Visit Summary has been reviewed and is understood.  Full responsibility of the confidentiality of this discharge information lies with you and/or your care-partner.

## 2022-06-24 NOTE — Progress Notes (Signed)
[facility] History and Physical   Primary Care Physician:  Angelica Arabian, MD   Reason for Procedure:   Hx colon polyps + FHx CRCA  Plan:    colonoscopy     HPI: Angelica Davis is a 81 y.o. female here for polyp surveillance exam  Also FHx CRCA elderly mom and polyps father 2004 12 mm adenoma 2010 diminutive adenoma 2015 - diminutive transverse polyp - adenoma - 05/10/2019 4 diminutive polyps adenomas and ssp's - Past Medical History:  Diagnosis Date   Arthritis    Cataract    bilateral, has had removed   Colitis    Diverticulosis    History of blood transfusion    after birth of child   Hx of colonic polyps - adenomas 08/16/2002   Hyperlipidemia    Skin cancer    skin     Past Surgical History:  Procedure Laterality Date   CATARACT EXTRACTION Bilateral 01/13/2019   COLONOSCOPY  last 05/12/2014   polyps removed 2015, 2020 TA x1   KNEE ARTHROSCOPY Left 06/2007   Dr. Noemi Davis   PARTIAL KNEE ARTHROPLASTY Right 08/27/2021   Procedure: UNICOMPARTMENTAL KNEE;  Surgeon: Angelica Bond, MD;  Location: [facility];  Service: Orthopedics;  Laterality: Right;   TONSILLECTOMY     TOTAL KNEE ARTHROPLASTY Left 12/2008   Dr. Noemi Davis    Prior to Admission medications   Medication Sig Start Date End Date Taking? Authorizing Provider  acetaminophen (TYLENOL) 500 MG tablet Take 500 mg by mouth every 6 (six) hours as needed.   Yes [provider]  Ascorbic Acid (VITAMIN C PO) Take 1 tablet by mouth 2 (two) times daily.   Yes [provider]  Cyanocobalamin (VITAMIN B 12 PO) Take by mouth.   Yes [provider]  thiamine (VITAMIN B-1) 100 MG tablet Take 100 mg by mouth daily.   Yes [provider]  VITAMIN D PO Take 1 capsule by mouth daily.   Yes [provider]  aspirin EC 325 MG tablet Take 1 tablet (325 mg total) by mouth 2 (two) times daily. Patient not taking: Reported on 06/24/2022 08/27/21   Angelica Pulling K, PA-C   ondansetron (ZOFRAN) 4 MG tablet Take 1 tablet (4 mg total) by mouth every 8 (eight) hours as needed for nausea or vomiting. 08/27/21   Angelica Bruns, PA-C  oxyCODONE (ROXICODONE) 5 MG immediate release tablet Take 1 tablet (5 mg total) by mouth every 4 (four) hours as needed for severe pain. 08/27/21   Angelica Pulling K, PA-C  sennosides-docusate sodium (SENOKOT-S) 8.6-50 MG tablet Take 2 tablets by mouth daily. 08/27/21   Angelica Pulling K, PA-C  TART CHERRY PO Take by mouth.    [provider]    Current Outpatient Medications  Medication Sig Dispense Refill   acetaminophen (TYLENOL) 500 MG tablet Take 500 mg by mouth every 6 (six) hours as needed.     Ascorbic Acid (VITAMIN C PO) Take 1 tablet by mouth 2 (two) times daily.     Cyanocobalamin (VITAMIN B 12 PO) Take by mouth.     thiamine (VITAMIN B-1) 100 MG tablet Take 100 mg by mouth daily.     VITAMIN D PO Take 1 capsule by mouth daily.     aspirin EC 325 MG tablet Take 1 tablet (325 mg total) by mouth 2 (two) times daily. (Patient not taking: Reported on 06/24/2022) 60 tablet 0   ondansetron (ZOFRAN) 4 MG tablet Take 1 tablet (4 mg total)  by mouth every 8 (eight) hours as needed for nausea or vomiting. 10 tablet 0   oxyCODONE (ROXICODONE) 5 MG immediate release tablet Take 1 tablet (5 mg total) by mouth every 4 (four) hours as needed for severe pain. 30 tablet 0   sennosides-docusate sodium (SENOKOT-S) 8.6-50 MG tablet Take 2 tablets by mouth daily. 30 tablet 1   TART CHERRY PO Take by mouth.     Current Facility-Administered Medications  Medication Dose Route Frequency Provider Last Rate Last Admin   0.9 %  sodium chloride infusion  500 mL Intravenous Once Angelica Mayer, MD        Allergies as of 06/24/2022 - Review Complete 06/24/2022  Allergen Reaction Noted   Fenofibrate Other (See Comments) 08/13/2020   Lovastatin Other (See Comments) 08/13/2020   Pravastatin Other (See Comments) 08/13/2020    Family History   Problem Relation Age of Onset   Colon polyps Mother    Colon cancer Mother 23   Other Father        rectal polyps   COPD Father    Colon polyps Father    Breast cancer Paternal Aunt    Esophageal cancer Neg Hx    Rectal cancer Neg Hx    Stomach cancer Neg Hx     Social History   Socioeconomic History   Marital status: Divorced    Spouse name: Not on file   Number of children: Not on file   Years of education: Not on file   Highest education level: Not on file  Occupational History   Not on file  Tobacco Use   Smoking status: Never   Smokeless tobacco: Never  Vaping Use   Vaping Use: Never used  Substance and Sexual Activity   Alcohol use: Never   Drug use: Never   Sexual activity: Not Currently    Birth control/protection: Post-menopausal  Other Topics Concern   Not on file  Social History Narrative   Not on file   Social Determinants of Health   Financial Resource Strain: Not on file  Food Insecurity: Not on file  Transportation Needs: Not on file  Physical Activity: Not on file  Stress: Not on file  Social Connections: Not on file  Intimate Partner Violence: Not on file    Review of Systems:  All other review of systems negative except as mentioned in the HPI.  Physical Exam: Vital signs BP 130/63   Pulse 79   Temp 97.7 F (36.5 C) (Temporal)   Ht '5\' 2"'$  (1.575 m)   Wt 226 lb (102.5 kg)   LMP 07/07/2004   SpO2 97%   BMI 41.34 kg/m   General:   Alert,  Well-developed, well-nourished, pleasant and cooperative in NAD Lungs:  Clear throughout to auscultation.   Heart:  Regular rate and rhythm; no murmurs, clicks, rubs,  or gallops. Abdomen:  Soft, nontender and nondistended. Normal bowel sounds.   Neuro/Psych:  Alert and cooperative. Normal mood and affect. A and O x 3   '@Angelica Davis'$  Angelica Maffucci, MD, [facility] [phone] (pager) 06/24/2022 9:21 AM@

## 2022-06-25 ENCOUNTER — Telehealth: Payer: Self-pay

## 2022-06-25 NOTE — Telephone Encounter (Signed)
  Follow up Call-     06/24/2022    8:04 AM  Call back number  Post procedure Call Back phone  # [phone]  Permission to leave phone message Yes     Patient questions:  Do you have a fever, pain , or abdominal swelling? No. Pain Score  0 *  Have you tolerated food without any problems? Yes.    Have you been able to return to your normal activities? Yes.    Do you have any questions about your discharge instructions: Diet   No. Medications  No. Follow up visit  No.  Do you have questions or concerns about your Care? No.  Actions: * If pain score is 4 or above: No action needed, pain <4.

## 2022-07-01 ENCOUNTER — Encounter: Payer: Self-pay | Admitting: Internal Medicine

## 2022-08-11 ENCOUNTER — Ambulatory Visit
Admission: EM | Admit: 2022-08-11 | Discharge: 2022-08-11 | Disposition: A | Discharge status: Home / Self Care | Payer: Medicare Other | Attending: Internal Medicine | Admitting: Internal Medicine

## 2022-08-11 ENCOUNTER — Encounter: Payer: Self-pay | Admitting: Emergency Medicine

## 2022-08-11 DIAGNOSIS — Z1152 Encounter for screening for COVID-19: Secondary | ICD-10-CM | POA: Diagnosis not present

## 2022-08-11 DIAGNOSIS — R059 Cough, unspecified: Secondary | ICD-10-CM | POA: Insufficient documentation

## 2022-08-11 DIAGNOSIS — J069 Acute upper respiratory infection, unspecified: Secondary | ICD-10-CM | POA: Diagnosis not present

## 2022-08-11 MED ORDER — FLUTICASONE PROPIONATE 50 MCG/ACT NA SUSP: 1.0000 | Freq: Every day | NASAL | 0 refills | Status: DC

## 2022-08-11 MED ORDER — BENZONATATE 100 MG PO CAPS: 100.0000 mg | ORAL_CAPSULE | Freq: Three times a day (TID) | ORAL | 0 refills | Status: DC | PRN

## 2022-08-11 NOTE — ED Provider Notes (Signed)
[facility]    CSN: [id number] Arrival date & time: 08/11/22  1016      History   Chief Complaint Chief Complaint  Patient presents with   Nasal Drainage    HPI Angelica Davis is a 82 y.o. female.   Patient presents with nasal drainage, nasal congestion, scratchy throat, cough that has been present for about 5 days.  Patient has been taking over-the-counter "allergy medication" with some improvement in symptoms.  Patient denies any known sick contacts or fever at home.  Patient requesting COVID testing so that she can go see her mother in the nursing home.  Denies chest pain, shortness of breath, sore throat, ear pain, nausea, vomiting, diarrhea, abdominal pain.  Denies history of asthma or COPD and patient does not smoke cigarettes.     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 Noemi Chapel   PARTIAL KNEE ARTHROPLASTY Right 08/27/2021   Procedure: UNICOMPARTMENTAL KNEE;  Surgeon: Marchia Bond, MD;  Location: [facility];  Service: Orthopedics;  Laterality: Right;   TONSILLECTOMY     TOTAL KNEE ARTHROPLASTY Left 12/2008   Dr. Noemi Chapel    OB History     Gravida  3   Para  3   Term      Preterm      AB      Living  3      SAB      IAB       Ectopic      Multiple      Live Births               Home Medications    Prior to Admission medications   Medication Sig Start Date End Date Taking? Authorizing Provider  acetaminophen (TYLENOL) 500 MG tablet Take 500 mg by mouth every 6 (six) hours as needed.   Yes [provider]  Ascorbic Acid (VITAMIN C PO) Take 1 tablet by mouth 2 (two) times daily.   Yes [provider]  benzonatate (TESSALON) 100 MG capsule Take 1 capsule (100 mg total) by mouth every 8 (eight) hours as needed for cough. 08/11/22  Yes Blanche Gallien, Hildred Alamin E, FNP  Cyanocobalamin (VITAMIN B 12 PO) Take by mouth.   Yes [provider]  fluticasone (FLONASE) 50 MCG/ACT nasal spray Place 1 spray into both nostrils daily. 08/11/22  Yes Dessiree Sze, Hildred Alamin E, FNP  ondansetron (ZOFRAN) 4 MG tablet Take 1 tablet (4 mg total) by mouth every 8 (eight) hours as needed for nausea or vomiting. 08/27/21  Yes Merlene Pulling K, PA-C  TART CHERRY PO Take by mouth.   Yes [provider]  thiamine (VITAMIN B-1) 100 MG tablet Take 100 mg by mouth daily.   Yes [provider]  VITAMIN D PO Take 1 capsule by mouth daily.   Yes [provider]    Family History Family History  Problem Relation Age of Onset   Colon polyps Mother    Colon cancer Mother 24   Other Father        rectal polyps   COPD Father    Colon polyps Father    Breast cancer Paternal Aunt    Esophageal cancer Neg Hx    Rectal cancer Neg Hx    Stomach cancer Neg Hx     Social History Social History   Tobacco Use   Smoking status: Never   Smokeless tobacco: Never  Vaping Use   Vaping Use: Never used  Substance Use Topics   Alcohol use: Never   Drug use: Never     Allergies   Fenofibrate, Lovastatin, and Pravastatin   Review of Systems Review of Systems Per HPI  Physical Exam Triage Vital Signs ED Triage Vitals  Enc Vitals Group     BP 08/11/22 1044 (!) 148/84     Pulse Rate 08/11/22 1044 88      Resp 08/11/22 1044 18     Temp 08/11/22 1044 98.4 F (36.9 C)     Temp Source 08/11/22 1044 Oral     SpO2 08/11/22 1044 95 %     Weight 08/11/22 1046 225 lb (102.1 kg)     Height 08/11/22 1046 '5\' 3"'$  (1.6 m)     Head Circumference --      Peak Flow --      Pain Score 08/11/22 1046 0     Pain Loc --      Pain Edu? --      Excl. in [place]? --    No data found.  Updated Vital Signs BP (!) 148/84 (BP Location: Left Arm)   Pulse 88   Temp 98.4 F (36.9 C) (Oral)   Resp 18   Ht '5\' 3"'$  (1.6 m)   Wt 225 lb (102.1 kg)   LMP 07/07/2004   SpO2 95%   BMI 39.86 kg/m   Visual Acuity Right Eye Distance:   Left Eye Distance:   Bilateral Distance:    Right Eye Near:   Left Eye Near:    Bilateral Near:     Physical Exam Constitutional:      General: She is not in acute distress.    Appearance: Normal appearance. She is not toxic-appearing or diaphoretic.  HENT:     Head: Normocephalic and atraumatic.     Right Ear: Tympanic membrane and ear canal normal.     Left Ear: Tympanic membrane and ear canal normal.     Nose: Congestion present.     Mouth/Throat:     Mouth: Mucous membranes are moist.     Pharynx: No posterior oropharyngeal erythema.  Eyes:     Extraocular Movements: Extraocular movements intact.     Conjunctiva/sclera: Conjunctivae normal.     Pupils: Pupils are equal, round, and reactive to light.  Cardiovascular:     Rate and Rhythm: Normal rate and regular rhythm.     Pulses: Normal pulses.     Heart sounds: Normal heart sounds.  Pulmonary:     Effort: Pulmonary effort is normal. No respiratory distress.     Breath sounds: Normal breath sounds. No stridor. No wheezing, rhonchi or rales.  Abdominal:  General: Abdomen is flat. Bowel sounds are normal.     Palpations: Abdomen is soft.  Musculoskeletal:        General: Normal range of motion.     Cervical back: Normal range of motion.  Skin:    General: Skin is warm and dry.  Neurological:     General: No  focal deficit present.     Mental Status: She is alert and oriented to person, place, and time. Mental status is at baseline.  Psychiatric:        Mood and Affect: Mood normal.        Behavior: Behavior normal.      UC Treatments / Results  Labs (all labs ordered are listed, but only abnormal results are displayed) Labs Reviewed  SARS CORONAVIRUS 2 (TAT 6-24 HRS)    EKG   Radiology No results found.  Procedures Procedures (including critical care time)  Medications Ordered in UC Medications - No data to display  Initial Impression / Assessment and Plan / UC Course  I have reviewed the triage vital signs and the nursing notes.  Pertinent labs & imaging results that were available during my care of the patient were reviewed by me and considered in my medical decision making (see chart for details).     Patient presents with symptoms likely from a viral upper respiratory infection. Do not suspect underlying cardiopulmonary process. Symptoms seem unlikely related to ACS, CHF or COPD exacerbations, pneumonia, pneumothorax. Patient is nontoxic appearing and not in need of emergent medical intervention.  COVID test pending.  Patient is on day 5 of symptoms so will most likely be outside the treatment window for COVID antivirals if it is positive.  Did discuss this with the patient.  No adventitious lung sounds on exam so do not think that chest imaging is necessary.  Recommended symptom control with medications and supportive care.  Patient sent prescriptions.  Return if symptoms fail to improve in 1-2 weeks or you develop shortness of breath, chest pain, severe headache. Patient states understanding and is agreeable.  Discharged with PCP followup.  Final Clinical Impressions(s) / UC Diagnoses   Final diagnoses:  Viral upper respiratory tract infection with cough     Discharge Instructions      COVID test is pending.  We will call if it is positive.  It appears that you  have a viral illness that should run its course and self resolve with the help of symptomatic treatment.  I have prescribed you 2 medications to help alleviate symptoms.  Please follow-up if any symptoms persist or worsen.    ED Prescriptions     Medication Sig Dispense Auth. Provider   fluticasone (FLONASE) 50 MCG/ACT nasal spray Place 1 spray into both nostrils daily. 16 g Dariela Stoker, Hildred Alamin E, [place]   benzonatate (TESSALON) 100 MG capsule Take 1 capsule (100 mg total) by mouth every 8 (eight) hours as needed for cough. 21 capsule [place], Michele Rockers, [place]      PDMP not reviewed this encounter.   Teodora Medici, [place] 08/11/22 1128

## 2022-08-11 NOTE — Discharge Instructions (Signed)
COVID test is pending.  We will call if it is positive.  It appears that you have a viral illness that should run its course and self resolve with the help of symptomatic treatment.  I have prescribed you 2 medications to help alleviate symptoms.  Please follow-up if any symptoms persist or worsen.

## 2022-08-11 NOTE — ED Triage Notes (Signed)
Patient c/o nasal drainage, scratchy throat, congestion x 4 days.  Patient taken OTC allergy medication w/minimal relief.  Patient requesting a COVID test.

## 2022-08-12 LAB — SARS CORONAVIRUS 2 (TAT 6-24 HRS): SARS Coronavirus 2: NEGATIVE

## 2022-09-17 ENCOUNTER — Ambulatory Visit (INDEPENDENT_AMBULATORY_CARE_PROVIDER_SITE_OTHER): Payer: Medicare Other | Admitting: Family

## 2022-09-17 ENCOUNTER — Encounter: Payer: Self-pay | Admitting: Family

## 2022-09-17 VITALS — BP 138/78 | HR 82 | Temp 98.4°F | Resp 16 | Ht 62.5 in | Wt 225.0 lb

## 2022-09-17 DIAGNOSIS — R059 Cough, unspecified: Secondary | ICD-10-CM

## 2022-09-17 DIAGNOSIS — E785 Hyperlipidemia, unspecified: Secondary | ICD-10-CM

## 2022-09-17 DIAGNOSIS — R32 Unspecified urinary incontinence: Secondary | ICD-10-CM | POA: Diagnosis not present

## 2022-09-17 DIAGNOSIS — C4490 Unspecified malignant neoplasm of skin, unspecified: Secondary | ICD-10-CM | POA: Insufficient documentation

## 2022-09-17 NOTE — Assessment & Plan Note (Signed)
Notes 6 week hx of cough- some post nasal drainage. Recommended trial of claritin 10 mg once daily.

## 2022-09-17 NOTE — Assessment & Plan Note (Signed)
Uncontrolled. I advised her to follow back up with Dr. Wannetta Sender, [place] Bi.  She is now ready to consider a procedure as her symptoms have worsened.

## 2022-09-17 NOTE — Assessment & Plan Note (Signed)
Reports recent total cholesterol 211 at her wellness visit.  Will request records from her previous provider for review.

## 2022-09-17 NOTE — Progress Notes (Signed)
Subjective:   By signing my name below, I, Jamey Reas, attest that this documentation has been prepared under the direction and in the presence of Debbrah Alar, NP. 09/17/2022   Patient ID: Angelica Davis, female    DOB: Jun 13, 1941, 82 y.o.   MRN: [id number]  Chief Complaint  Patient presents with   New Patient (Initial Visit)    Here to establish care, goes to [facility] but pcp retiring     HPI Patient is in today for a new patient appointment.  ED: She visited the ED on 08/11/2022 for a viral upper respiratory infection.  Establish primary care: She is looking to establish primary care due to her previous physician retiring.  Incontinence: She complains of chronic incontinence and pain in her pelvis. She has done pelvic floor physical therapy and myrbetriq to help manage it.   Cough: She complains of a cough that started a month ago. She has taken Flonase to manage it. She denies any sinus pain or pressure.   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 She had nose surgeries that were three and five years ago to remove the cancer.  Cholesterol: She has a history of high cholesterol. Her latest cholesterol levels were 121.   Colitis: She has a history of colitis, but has not had any episodes in many years.   Colonoscopy: Last completed on 06/24/2022.   Social History: Her mother has a history of  colon cancer and dementia. Her mother is living and is 101. Her father passed away from diverticulitis and has a history of COPD. Her brother is living and has type II diabetes mellitus. Her maternal grandmother passed away from a stroke. Her maternal grandfather passed and had a history of arthritis and dementia. Her paternal grandmother passed and has a history of heart issues. Her youngest daughter has type II diabetes mellitus. Her son has ADHD. Her paternal aunt has a history of breast cancer. She does not use tobacco or vaping products. She does not consume  alcohol.   Past Medical History:  Diagnosis Date   Arthritis    Cataract    bilateral, has had removed   Colitis    Diverticulosis    History of blood transfusion    after birth of child   Hx of colonic polyps - adenomas 08/16/2002   Hyperlipidemia    Skin cancer    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 Noemi Chapel   MOHS SURGERY     bridge of nose x 2 (unsure of dates ?2021 and 2018)   PARTIAL KNEE ARTHROPLASTY Right 08/27/2021   Procedure: UNICOMPARTMENTAL KNEE;  Surgeon: Marchia Bond, MD;  Location: [facility];  Service: Orthopedics;  Laterality: Right;   TONSILLECTOMY     TOTAL KNEE ARTHROPLASTY Left 12/2008   Dr. Noemi Chapel    Family History  Problem Relation Age of Onset   Colon polyps Mother    Colon cancer Mother 37   Dementia Mother    Other Father        rectal polyps   COPD Father    Colon polyps Father    Diverticulitis Father    Diabetes Mellitus II Brother    CVA Maternal Grandmother  Dementia Maternal Grandfather    Arthritis Maternal Grandfather    Heart disease Paternal Grandmother    Dementia Paternal Grandmother    Suicidality Paternal Grandfather    Diabetes Mellitus II Daughter    ADD / ADHD Son    Breast cancer Paternal Aunt    Esophageal cancer Neg Hx    Rectal cancer Neg Hx    Stomach cancer Neg Hx     Social History   Socioeconomic History   Marital status: Divorced    Spouse name: Not on file   Number of children: Not on file   Years of education: Not on file   Highest education level: Not on file  Occupational History   Not on file  Tobacco Use   Smoking status: Never   Smokeless tobacco: Never  Vaping Use   Vaping Use: Never used  Substance and Sexual Activity   Alcohol use: Never   Drug use: Never   Sexual  activity: Not Currently    Birth control/protection: Post-menopausal  Other Topics Concern   Not on file  Social History Narrative   Retired, 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)   Divorced   2 daughters and 1 son, all in [place]   5 grandchildren   Completed 12th grade   Enjoys spending time with her mother at SNF, cares for her great grand daughter, 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     Ascorbic Acid (VITAMIN C PO) Take 1 tablet by mouth 2 (two) times daily.     Cyanocobalamin (VITAMIN B 12 PO) Take by mouth.     fluticasone (FLONASE) 50 MCG/ACT nasal spray Place 1 spray into both nostrils daily. 16 g 0   TART CHERRY PO Take by mouth.     thiamine (VITAMIN B-1) 100 MG tablet Take 100 mg by mouth daily.     VITAMIN D PO Take 1 capsule by mouth daily.     benzonatate (TESSALON) 100 MG capsule Take 1 capsule (100 mg total) by mouth every 8 (eight) hours as needed for cough. 21 capsule 0   ondansetron (ZOFRAN) 4 MG tablet Take 1 tablet (4 mg total) by mouth every 8 (eight) hours as needed for nausea or vomiting. 10 tablet 0   No facility-administered medications prior to visit.    Allergies  Allergen Reactions   Fenofibrate Other (See Comments)    Other reaction(s): Leg aches   Lovastatin Other (See Comments)    Other reaction(s): Leg aches   Pravastatin Other (See Comments)    Other reaction(s): Leg aches    ROS See HPI    Objective:    Physical Exam Constitutional:      General: She is not in acute distress.    Appearance: Normal  appearance.  HENT:     Head: Normocephalic and atraumatic.     Right Ear: External ear normal.     Left Ear: External ear normal.  Eyes:     Extraocular Movements: Extraocular movements intact.  Pupils: Pupils are equal, round, and reactive to light.  Cardiovascular:     Rate and Rhythm: Normal rate and regular rhythm.     Heart sounds: Normal heart sounds. No murmur heard.    No gallop.  Pulmonary:     Effort: No respiratory distress.     Breath sounds: Normal breath sounds. No wheezing or rales.  Skin:    General: Skin is warm.  Neurological:     Mental Status: She is alert and oriented to person, place, and time.  Psychiatric:        Judgment: Judgment normal.     BP 138/78 (BP Location: Left Arm, Patient Position: Sitting, Cuff Size: Large)   Pulse 82   Temp 98.4 F (36.9 C) (Oral)   Resp 16   Ht 5' 2.5" (1.588 m)   Wt 225 lb (102.1 kg)   LMP 07/07/2004   SpO2 98%   BMI 40.50 kg/m  Wt Readings from Last 3 Encounters:  09/17/22 225 lb (102.1 kg)  08/11/22 225 lb (102.1 kg)  06/24/22 226 lb (102.5 kg)       Assessment & Plan:  Urinary incontinence, unspecified type Assessment & Plan: Uncontrolled. I advised her to follow back up with Dr. Wannetta Sender, [place] Bi.  She is now ready to consider a procedure as her symptoms have worsened.    Cough, unspecified type Assessment & Plan: Notes 6 week hx of cough- some post nasal drainage. Recommended trial of claritin 10 mg once daily.    Hyperlipidemia, unspecified hyperlipidemia type Assessment & Plan: Reports recent total cholesterol 211 at her wellness visit.  Will request records from her previous provider for review.     30 minutes spent on today's visit. Time was spent interviewing patient and reviewing available medical record.  I, Nance Pear, NP, personally preformed the services described in this documentation.  All medical record entries made by the scribe were at my direction and in my  presence.  I have reviewed the chart and discharge instructions (if applicable) and agree that the record reflects my personal performance and is accurate and complete. 09/17/2022  Nance Pear, NP  Harvest Dark as a scribe for Nance Pear, NP.,have documented all relevant documentation on the behalf of Nance Pear, NP,as directed by  Nance Pear, NP while in the presence of Nance Pear, NP.

## 2022-10-13 ENCOUNTER — Telehealth: Payer: Self-pay | Admitting: Family

## 2022-10-13 NOTE — Telephone Encounter (Signed)
Contacted Angelica Davis to schedule their annual wellness visit. Appointment made for 10/22/2022.  Verlee Rossetti; Care Guide Ambulatory Clinical Support [facility] Direct Dial: [phone]

## 2022-10-20 LAB — HM MAMMOGRAPHY

## 2022-10-22 ENCOUNTER — Telehealth: Payer: Self-pay | Admitting: *Deleted

## 2022-10-22 ENCOUNTER — Ambulatory Visit (INDEPENDENT_AMBULATORY_CARE_PROVIDER_SITE_OTHER): Payer: Medicare Other | Admitting: *Deleted

## 2022-10-22 DIAGNOSIS — Z0000 Encounter for general adult medical examination without abnormal findings: Secondary | ICD-10-CM

## 2022-10-22 NOTE — Patient Instructions (Signed)
Angelica Davis , Thank you for taking time to come for your Medicare Wellness Visit. I appreciate your ongoing commitment to your health goals. Please review the following plan we discussed and let me know if I can assist you in the future.   These are the goals we discussed:  Goals   None     This is a list of the screening recommended for you and due dates:  Health Maintenance  Topic Date Due   COVID-19 Vaccine (1) Never done   DTaP/Tdap/Td vaccine (1 - Tdap) Never done   Zoster (Shingles) Vaccine (1 of 2) Never done   Pneumonia Vaccine (1 of 1 - PCV) Never done   Flu Shot  02/05/2023   Medicare Annual Wellness Visit  10/22/2023   Colon Cancer Screening  06/24/2025   DEXA scan (bone density measurement)  Completed   HPV Vaccine  Aged Out     Next appointment: Follow up in one year for your annual wellness visit.   Preventive Care 64 Years and Older, Female Preventive care refers to lifestyle choices and visits with your health care provider that can promote health and wellness. What does preventive care include? A yearly physical exam. This is also called an annual well check. Dental exams once or twice a year. Routine eye exams. Ask your health care provider how often you should have your eyes checked. Personal lifestyle choices, including: Daily care of your teeth and gums. Regular physical activity. Eating a healthy diet. Avoiding tobacco and drug use. Limiting alcohol use. Practicing safe sex. Taking low-dose aspirin every day. Taking vitamin and mineral supplements as recommended by your health care provider. What happens during an annual well check? The services and screenings done by your health care provider during your annual well check will depend on your age, overall health, lifestyle risk factors, and family history of disease. Counseling  Your health care provider may ask you questions about your: Alcohol use. Tobacco use. Drug use. Emotional  well-being. Home and relationship well-being. Sexual activity. Eating habits. History of falls. Memory and ability to understand (cognition). Work and work Astronomer. Reproductive health. Screening  You may have the following tests or measurements: Height, weight, and BMI. Blood pressure. Lipid and cholesterol levels. These may be checked every 5 years, or more frequently if you are over 67 years old. Skin check. Lung cancer screening. You may have this screening every year starting at age 80 if you have a 30-pack-year history of smoking and currently smoke or have quit within the past 15 years. Fecal occult blood test (FOBT) of the stool. You may have this test every year starting at age 80. Flexible sigmoidoscopy or colonoscopy. You may have a sigmoidoscopy every 5 years or a colonoscopy every 10 years starting at age 80. Hepatitis C blood test. Hepatitis B blood test. Sexually transmitted disease (STD) testing. Diabetes screening. This is done by checking your blood sugar (glucose) after you have not eaten for a while (fasting). You may have this done every 1-3 years. Bone density scan. This is done to screen for osteoporosis. You may have this done starting at age 67. Mammogram. This may be done every 1-2 years. Talk to your health care provider about how often you should have regular mammograms. Talk with your health care provider about your test results, treatment options, and if necessary, the need for more tests. Vaccines  Your health care provider may recommend certain vaccines, such as: Influenza vaccine. This is recommended every year. Tetanus,  diphtheria, and acellular pertussis (Tdap, Td) vaccine. You may need a Td booster every 10 years. Zoster vaccine. You may need this after age 80. Pneumococcal 13-valent conjugate (PCV13) vaccine. One dose is recommended after age 80. Pneumococcal polysaccharide (PPSV23) vaccine. One dose is recommended after age 80. Talk to your  health care provider about which screenings and vaccines you need and how often you need them. This information is not intended to replace advice given to you by your health care provider. Make sure you discuss any questions you have with your health care provider. Document Released: 07/20/2015 Document Revised: 03/12/2016 Document Reviewed: 04/24/2015 Elsevier Interactive Patient Education  2017 [place] Prevention in the Home Falls can cause injuries. They can happen to people of all ages. There are many things you can do to make your home safe and to help prevent falls. What can I do on the outside of my home? Regularly fix the edges of walkways and driveways and fix any cracks. Remove anything that might make you trip as you walk through a door, such as a raised step or threshold. Trim any bushes or trees on the path to your home. Use bright outdoor lighting. Clear any walking paths of anything that might make someone trip, such as rocks or tools. Regularly check to see if handrails are loose or broken. Make sure that both sides of any steps have handrails. Any raised decks and porches should have guardrails on the edges. Have any leaves, snow, or ice cleared regularly. Use sand or salt on walking paths during winter. Clean up any spills in your garage right away. This includes oil or grease spills. What can I do in the bathroom? Use night lights. Install grab bars by the toilet and in the tub and shower. Do not use towel bars as grab bars. Use non-skid mats or decals in the tub or shower. If you need to sit down in the shower, use a plastic, non-slip stool. Keep the floor dry. Clean up any water that spills on the floor as soon as it happens. Remove soap buildup in the tub or shower regularly. Attach bath mats securely with double-sided non-slip rug tape. Do not have throw rugs and other things on the floor that can make you trip. What can I do in the bedroom? Use night  lights. Make sure that you have a light by your bed that is easy to reach. Do not use any sheets or blankets that are too big for your bed. They should not hang down onto the floor. Have a firm chair that has side arms. You can use this for support while you get dressed. Do not have throw rugs and other things on the floor that can make you trip. What can I do in the kitchen? Clean up any spills right away. Avoid walking on wet floors. Keep items that you use a lot in easy-to-reach places. If you need to reach something above you, use a strong step stool that has a grab bar. Keep electrical cords out of the way. Do not use floor polish or wax that makes floors slippery. If you must use wax, use non-skid floor wax. Do not have throw rugs and other things on the floor that can make you trip. What can I do with my stairs? Do not leave any items on the stairs. Make sure that there are handrails on both sides of the stairs and use them. Fix handrails that are broken or loose. Make  sure that handrails are as long as the stairways. Check any carpeting to make sure that it is firmly attached to the stairs. Fix any carpet that is loose or worn. Avoid having throw rugs at the top or bottom of the stairs. If you do have throw rugs, attach them to the floor with carpet tape. Make sure that you have a light switch at the top of the stairs and the bottom of the stairs. If you do not have them, ask someone to add them for you. What else can I do to help prevent falls? Wear shoes that: Do not have high heels. Have rubber bottoms. Are comfortable and fit you well. Are closed at the toe. Do not wear sandals. If you use a stepladder: Make sure that it is fully opened. Do not climb a closed stepladder. Make sure that both sides of the stepladder are locked into place. Ask someone to hold it for you, if possible. Clearly mark and make sure that you can see: Any grab bars or handrails. First and last  steps. Where the edge of each step is. Use tools that help you move around (mobility aids) if they are needed. These include: Canes. Walkers. Scooters. Crutches. Turn on the lights when you go into a dark area. Replace any light bulbs as soon as they burn out. Set up your furniture so you have a clear path. Avoid moving your furniture around. If any of your floors are uneven, fix them. If there are any pets around you, be aware of where they are. Review your medicines with your doctor. Some medicines can make you feel dizzy. This can increase your chance of falling. Ask your doctor what other things that you can do to help prevent falls. This information is not intended to replace advice given to you by your health care provider. Make sure you discuss any questions you have with your health care provider. Document Released: 04/19/2009 Document Revised: 11/29/2015 Document Reviewed: 07/28/2014 Elsevier Interactive Patient Education  2017 [employer].

## 2022-10-22 NOTE — Progress Notes (Signed)
Subjective:   Angelica Davis is a 82 y.o. female who presents for Medicare Annual (Subsequent) preventive examination.  I connected with  Angelica Davis on 10/22/22 by a audio enabled telemedicine application and verified that I am speaking with the correct person using two identifiers.  Patient Location: Home  Provider Location: Office/Clinic  I discussed the limitations of evaluation and management by telemedicine. The patient expressed understanding and agreed to proceed.   Review of Systems     Cardiac Risk Factors include: advanced age (>25men, >67 women);dyslipidemia;obesity (BMI >30kg/m2)     Objective:    There were no vitals filed for this visit. There is no height or weight on file to calculate BMI.     10/22/2022    1:53 PM 09/02/2021    8:49 AM 08/16/2021    1:23 PM 04/28/2014   10:25 AM  Advanced Directives  Does Patient Have a Medical Advance Directive? No No No No  Would patient like information on creating a medical advance directive? No - Patient declined Yes (MAU/Ambulatory/Procedural Areas - Information given)      Current Medications (verified) Outpatient Encounter Medications as of 10/22/2022  Medication Sig   acetaminophen (TYLENOL) 500 MG tablet Take 500 mg by mouth every 6 (six) hours as needed.   Ascorbic Acid (VITAMIN C PO) Take 1 tablet by mouth 2 (two) times daily.   Cyanocobalamin (VITAMIN B 12 PO) Take by mouth.   fluticasone (FLONASE) 50 MCG/ACT nasal spray Place 1 spray into both nostrils daily.   TART CHERRY PO Take by mouth.   thiamine (VITAMIN B-1) 100 MG tablet Take 100 mg by mouth daily.   VITAMIN D PO Take 1 capsule by mouth daily.   No facility-administered encounter medications on file as of 10/22/2022.    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 Thurston Hole   MOHS SURGERY     bridge of nose x 2 (unsure of dates ?2021 and 2018)   PARTIAL KNEE ARTHROPLASTY Right 08/27/2021   Procedure: UNICOMPARTMENTAL KNEE;  Surgeon: Teryl Lucy, MD;  Location: [facility];  Service: Orthopedics;  Laterality: Right;   TONSILLECTOMY     TOTAL KNEE ARTHROPLASTY Left 12/2008   Dr. Thurston Hole   Family History  Problem Relation Age of Onset   Colon polyps Mother    Colon cancer Mother 32   Dementia Mother    Other Father        rectal polyps   COPD Father    Colon polyps Father    Diverticulitis Father    Diabetes Mellitus II Brother    CVA Maternal Grandmother    Dementia Maternal Grandfather    Arthritis Maternal Grandfather    Heart disease Paternal Grandmother    Dementia Paternal Grandmother    Suicidality Paternal Grandfather    Diabetes Mellitus II Daughter  ADD / ADHD Son    Breast cancer Paternal Aunt    Esophageal cancer Neg Hx    Rectal cancer Neg Hx    Stomach cancer Neg Hx    Social History   Socioeconomic History   Marital status: Divorced    Spouse name: Not on file   Number of children: Not on file   Years of education: Not on file   Highest education level: Not on file  Occupational History   Not on file  Tobacco Use   Smoking status: Never   Smokeless tobacco: Never  Vaping Use   Vaping Use: Never used  Substance and Sexual Activity   Alcohol use: Never   Drug use: Never   Sexual activity: Not Currently    Birth control/protection: Post-menopausal  Other Topics Concern   Not on file  Social History Narrative   Retired, 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)   Divorced   2 daughters and 1 son, all in [place]   5 grandchildren   Completed 12th grade   Enjoys spending time with her mother at SNF, cares for her great grand daughter, 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   Has one dog   Social Determinants of Health   Financial Resource Strain: Low Risk  (10/22/2022)   Overall Financial Resource Strain (CARDIA)    Difficulty of Paying Living Expenses: Not hard at all  Food Insecurity: No Food Insecurity (10/22/2022)   Hunger Vital Sign    Worried About Running Out of Food in the Last Year: Never true    Ran Out of Food in the Last Year: Never true  Transportation Needs: No Transportation Needs (10/22/2022)   PRAPARE - Administrator, Civil Service (Medical): No    Lack of Transportation (Non-Medical): No  Physical Activity: Inactive (10/22/2022)   Exercise Vital Sign    Days of Exercise per Week: 0 days    Minutes of Exercise per Session: 0 min  Stress: No Stress Concern Present (10/22/2022)   [employer] of Occupational Health - Occupational Stress Questionnaire    Feeling of Stress : Not at all  Social Connections: Moderately Integrated (10/22/2022)   Social Connection and Isolation Panel [NHANES]    Frequency of Communication with Friends and Family: More than three times a week    Frequency of Social Gatherings with Friends and Family: More than three times a week    Attends Religious Services: More than 4 times per year    Active Member of [employer] or Organizations: Yes    Attends Engineer, structural: More than 4 times per year    Marital Status: Divorced    Tobacco Counseling Counseling given: Not Answered   Clinical Intake:  Pre-visit preparation completed: Yes  Pain : No/denies pain  BMI - recorded: 40.5 Nutritional Status: BMI > 30  Obese Nutritional Risks: None Diabetes: No  How often do you need to have someone help you when you read instructions, pamphlets, or other written materials  from your doctor or pharmacy?: 1 - Never   Activities of Daily Living    10/22/2022    1:59 PM  In your present state of health, do you have any difficulty performing the following activities:  Hearing? 1  Comment some slight hearing loss  Vision? 0  Difficulty concentrating or making decisions? 0  Walking or climbing stairs? 0  Dressing or bathing? 0  Doing errands, shopping? 0  Preparing Food and eating ? N  Using  the Toilet? N  In the past six months, have you accidently leaked urine? Y  Do you have problems with loss of bowel control? Y  Comment occasionally  Managing your Medications? N  Managing your Finances? N  Housekeeping or managing your Housekeeping? N    Patient Care Team: Sandford Craze, NP as PCP - General (Internal Medicine) Jerene Bears, MD as Consulting Physician (Gynecology)  Indicate any recent Medical Services you may have received from other than [facility] providers in the past year (date may be approximate).     Assessment:   This is a routine wellness examination for Angelica Davis.  Hearing/Vision screen No results found.  Dietary issues and exercise activities discussed: Current Exercise Habits: The patient does not participate in regular exercise at present, Exercise limited by: None identified   Goals Addressed   None    Depression Screen    10/22/2022    1:58 PM 09/17/2022    9:59 AM  PHQ 2/9 Scores  PHQ - 2 Score 0 0  PHQ- 9 Score  0    Fall Risk    10/22/2022    1:55 PM 09/17/2022    9:59 AM  Fall Risk   Falls in the past year? 0 0  Number falls in past yr: 0 0  Injury with Fall? 0 0  Risk for fall due to : No Fall Risks No Fall Risks  Follow up Falls evaluation completed Falls evaluation completed    FALL RISK PREVENTION PERTAINING TO THE HOME:  Any stairs in or around the home? Yes  If so, are there any without handrails? No  Home free of loose throw rugs in walkways, pet beds, electrical cords, etc? Yes  Adequate lighting  in your home to reduce risk of falls? Yes   ASSISTIVE DEVICES UTILIZED TO PREVENT FALLS:  Life alert? No  Use of a cane, walker or w/c? No  Grab bars in the bathroom? No  Shower chair or bench in shower? Yes  Elevated toilet seat or a handicapped toilet? Yes   TIMED UP AND GO:  Was the test performed?  No, audio visit .   Cognitive Function:        10/22/2022    2:03 PM  6CIT Screen  What Year? 0 points  What month? 0 points  What time? 0 points  Count back from 20 0 points  Months in reverse 0 points  Repeat phrase 0 points  Total Score 0 points    Immunizations  There is no immunization history on file for this patient.  TDAP status: Due, Education has been provided regarding the importance of this vaccine. Advised may receive this vaccine at local pharmacy or Health Dept. Aware to provide a copy of the vaccination record if obtained from local pharmacy or Health Dept. Verbalized acceptance and understanding.  Flu Vaccine status: Up to date  Pneumococcal vaccine status: Due, Education has been provided regarding the importance of this vaccine. Advised may receive this vaccine at local pharmacy or Health Dept. Aware to provide a copy of the vaccination record if obtained from local pharmacy or Health Dept. Verbalized acceptance and understanding.  Covid-19 vaccine status: Information provided on how to obtain vaccines.   Qualifies for Shingles Vaccine? Yes   Zostavax completed No   Shingrix Completed?: No.    Education has been provided regarding the importance of this vaccine. Patient has been advised to call insurance company to determine out of pocket expense if they have not yet  received this vaccine. Advised may also receive vaccine at local pharmacy or Health Dept. Verbalized acceptance and understanding.  Screening Tests Health Maintenance  Topic Date Due   Medicare Annual Wellness (AWV)  Never done   COVID-19 Vaccine (1) Never done   DTaP/Tdap/Td (1 - Tdap)  Never done   Zoster Vaccines- Shingrix (1 of 2) Never done   Pneumonia Vaccine 53+ Years old (1 of 1 - PCV) Never done   INFLUENZA VACCINE  02/05/2023   COLONOSCOPY (Pts 45-30yrs Insurance coverage will need to be confirmed)  06/24/2025   DEXA SCAN  Completed   HPV VACCINES  Aged Out    Health Maintenance  Health Maintenance Due  Topic Date Due   Medicare Annual Wellness (AWV)  Never done   COVID-19 Vaccine (1) Never done   DTaP/Tdap/Td (1 - Tdap) Never done   Zoster Vaccines- Shingrix (1 of 2) Never done   Pneumonia Vaccine 64+ Years old (1 of 1 - PCV) Never done    Colorectal cancer screening: Type of screening: Colonoscopy. Completed 06/24/22. Repeat every 3 years  Mammogram status: Completed 10/20/22. Repeat every year  Bone Density status: Completed 08/08/19. Results reflect: Bone density results: NORMAL. Repeat every 2 years.  Lung Cancer Screening: (Low Dose CT Chest recommended if Age 91-80 years, 30 pack-year currently smoking OR have quit w/in 15years.) does not qualify.   Additional Screening:  Hepatitis C Screening: does not qualify  Vision Screening: Recommended annual ophthalmology exams for early detection of glaucoma and other disorders of the eye. Is the patient up to date with their annual eye exam?  Yes  Who is the provider or what is the name of the office in which the patient attends annual eye exams? Dr. Randon Goldsmith If pt is not established with a provider, would they like to be referred to a provider to establish care? No .   Dental Screening: Recommended annual dental exams for proper oral hygiene  Community Resource Referral / Chronic Care Management: CRR required this visit?  No   CCM required this visit?  No      Plan:     I have personally reviewed and noted the following in the patient's chart:   Medical and social history Use of alcohol, tobacco or illicit drugs  Current medications and supplements including opioid prescriptions. Patient is  not currently taking opioid prescriptions. Functional ability and status Nutritional status Physical activity Advanced directives List of other physicians Hospitalizations, surgeries, and ER visits in previous 12 months Vitals Screenings to include cognitive, depression, and falls Referrals and appointments  In addition, I have reviewed and discussed with patient certain preventive protocols, quality metrics, and best practice recommendations. A written personalized care plan for preventive services as well as general preventive health recommendations were provided to patient.   Due to this being a telephonic visit, the after visit summary with patients personalized plan was offered to patient via mail or my-chart. Patient would like to access on my-chart.  Donne Anon, [place]   10/22/2022   Nurse Notes: None

## 2022-10-22 NOTE — Telephone Encounter (Signed)
Opened in error

## 2023-01-07 ENCOUNTER — Telehealth: Payer: Self-pay | Admitting: Family

## 2023-01-07 DIAGNOSIS — E785 Hyperlipidemia, unspecified: Secondary | ICD-10-CM

## 2023-01-07 NOTE — Telephone Encounter (Signed)
Patient notified of results and will do the CT.

## 2023-01-07 NOTE — Telephone Encounter (Signed)
Please advise pt that I reviewed her results from her recent mammogram report.  It showed some calcifications of her breast arteries.  I would recommend that she complete a cardiac CT to look more closely at her coronary arteries.  I believe the cost of this test in $100. It is not usually covered by insurance.

## 2023-01-15 ENCOUNTER — Ambulatory Visit (HOSPITAL_BASED_OUTPATIENT_CLINIC_OR_DEPARTMENT_OTHER)
Admission: RE | Admit: 2023-01-15 | Discharge: 2023-01-15 | Disposition: A | Discharge status: Home / Self Care | Payer: Medicare Other | Source: Ambulatory Visit | Attending: Family | Admitting: Family

## 2023-01-15 DIAGNOSIS — E785 Hyperlipidemia, unspecified: Secondary | ICD-10-CM | POA: Insufficient documentation

## 2023-01-16 ENCOUNTER — Telehealth: Payer: Self-pay | Admitting: Family

## 2023-01-16 NOTE — Telephone Encounter (Signed)
See Mychart.

## 2023-01-22 ENCOUNTER — Telehealth: Payer: Self-pay | Admitting: Family

## 2023-01-22 NOTE — Telephone Encounter (Signed)
Please advise pt that her Cardiac CT notes some mild calcium build up in her coronary arteries. Would she be willing to start a low dose statin medication to decrease risk of heart attack and stroke?  If so, please send crestor 5mg  once daily #90 to her pharmacy.   Repeat lipids in 6 weeks.

## 2023-01-23 NOTE — Telephone Encounter (Signed)
Patient very hesitant to start statins. She will like to work on her diet.  When will she need to come back for follow up or labs?

## 2023-10-26 LAB — HM MAMMOGRAPHY

## 2023-10-27 ENCOUNTER — Encounter: Payer: Self-pay | Admitting: Family

## 2023-10-27 ENCOUNTER — Ambulatory Visit (INDEPENDENT_AMBULATORY_CARE_PROVIDER_SITE_OTHER): Payer: Medicare Other | Admitting: Family

## 2023-10-27 VITALS — BP 144/63 | HR 75 | Temp 98.8°F | Resp 16 | Ht 62.5 in | Wt 226.0 lb

## 2023-10-27 DIAGNOSIS — R5383 Other fatigue: Secondary | ICD-10-CM | POA: Insufficient documentation

## 2023-10-27 DIAGNOSIS — Z0001 Encounter for general adult medical examination with abnormal findings: Secondary | ICD-10-CM | POA: Diagnosis not present

## 2023-10-27 DIAGNOSIS — Z780 Asymptomatic menopausal state: Secondary | ICD-10-CM | POA: Diagnosis not present

## 2023-10-27 DIAGNOSIS — E785 Hyperlipidemia, unspecified: Secondary | ICD-10-CM

## 2023-10-27 DIAGNOSIS — R0609 Other forms of dyspnea: Secondary | ICD-10-CM | POA: Insufficient documentation

## 2023-10-27 DIAGNOSIS — Z0000 Encounter for general adult medical examination without abnormal findings: Secondary | ICD-10-CM | POA: Insufficient documentation

## 2023-10-27 DIAGNOSIS — R32 Unspecified urinary incontinence: Secondary | ICD-10-CM | POA: Diagnosis not present

## 2023-10-27 LAB — LIPID PANEL
Cholesterol: 234 mg/dL — ABNORMAL HIGH (ref 0–200)
HDL: 50.1 mg/dL (ref >39.00)
LDL Cholesterol: 149 mg/dL — ABNORMAL HIGH (ref 0–99)
NonHDL: 184.1
Total CHOL/HDL Ratio: 5
Triglycerides: 175 mg/dL — ABNORMAL HIGH (ref 0.0–149.0)
VLDL: 35 mg/dL (ref 0.0–40.0)

## 2023-10-27 LAB — CBC WITH DIFFERENTIAL/PLATELET
Basophils Absolute: 0 10*3/uL (ref 0.0–0.1)
Basophils Relative: 0.4 % (ref 0.0–3.0)
Eosinophils Absolute: 0.2 10*3/uL (ref 0.0–0.7)
Eosinophils Relative: 3.5 % (ref 0.0–5.0)
HCT: 40.2 % (ref 36.0–46.0)
Hemoglobin: 13.1 g/dL (ref 12.0–15.0)
Lymphocytes Relative: 33.3 % (ref 12.0–46.0)
Lymphs Abs: 2 10*3/uL (ref 0.7–4.0)
MCHC: 32.5 g/dL (ref 30.0–36.0)
MCV: 87.3 fl (ref 78.0–100.0)
Monocytes Absolute: 0.5 10*3/uL (ref 0.1–1.0)
Monocytes Relative: 8.4 % (ref 3.0–12.0)
Neutro Abs: 3.2 10*3/uL (ref 1.4–7.7)
Neutrophils Relative %: 54.4 % (ref 43.0–77.0)
Platelets: 221 10*3/uL (ref 150.0–400.0)
RBC: 4.61 Mil/uL (ref 3.87–5.11)
RDW: 14.7 % (ref 11.5–15.5)
WBC: 5.9 10*3/uL (ref 4.0–10.5)

## 2023-10-27 LAB — COMPREHENSIVE METABOLIC PANEL WITH GFR
ALT: 28 U/L (ref 0–35)
AST: 24 U/L (ref 0–37)
Albumin: 4.4 g/dL (ref 3.5–5.2)
Alkaline Phosphatase: 115 U/L (ref 39–117)
BUN: 22 mg/dL (ref 6–23)
CO2: 31 meq/L (ref 19–32)
Calcium: 9.4 mg/dL (ref 8.4–10.5)
Chloride: 104 meq/L (ref 96–112)
Creatinine, Ser: 0.84 mg/dL (ref 0.40–1.20)
GFR: 65.28 mL/min (ref >60.00)
Glucose, Bld: 85 mg/dL (ref 70–99)
Potassium: 5 meq/L (ref 3.5–5.1)
Sodium: 141 meq/L (ref 135–145)
Total Bilirubin: 0.4 mg/dL (ref 0.2–1.2)
Total Protein: 7.1 g/dL (ref 6.0–8.3)

## 2023-10-27 LAB — TSH: TSH: 3.25 u[IU]/mL (ref 0.35–5.50)

## 2023-10-27 NOTE — Patient Instructions (Signed)
 VISIT SUMMARY:  Today, you were seen for issues related to incontinence, fatigue, leg swelling, and general wellness. We discussed your symptoms and concerns, and I have provided a plan to address each of these issues.  YOUR PLAN:  -LEG WEAKNESS AND FATIGUE: Your leg weakness and fatigue are likely due to a lack of exercise. To rule out any heart-related issues, we will perform an EKG and refer you to a cardiologist. Additionally, I recommend physical therapy to help strengthen your muscles.  -EDEMA: The swelling in your legs and ankles may be related to your previous knee replacement and can worsen in hot weather. To help manage this, I suggest reducing your salt intake and elevating your legs when possible.  -ELEVATED CHOLESTEROL: You have a history of high cholesterol and have experienced muscle pain with previous medications. We will check your cholesterol levels with a lab test to determine the best course of action.  -URINARY INCONTINENCE: You have ongoing urinary incontinence and are not ready for surgery or medication at this time. We can discuss other options if your condition worsens.  -WELLNESS VISIT: This was a routine wellness visit. We discussed your general health, and I have ordered labs to check your cholesterol, thyroid, and for anemia. We will also repeat your bone density scan and request your immunization records.  INSTRUCTIONS:  Please follow up with the cardiologist as referred and attend physical therapy sessions as recommended. Make sure to get the lab tests done as ordered, and we will review the results during your next visit. If your symptoms worsen or you have any new concerns, please contact our office.

## 2023-10-27 NOTE — Assessment & Plan Note (Signed)
  Routine wellness visit. Discussed general health maintenance healthy diet and exercise. Recent bereavement affecting diet and exercise routine. Colonoscopy not needed unless symptoms develop based on age. Mammogram results pending. - Request immunization records from [facility] and health department. She reports that she has had pneumonia vaccine and shingles series.  - Repeat bone density scan. - Order labs including cholesterol, thyroid, and anemia screening.

## 2023-10-27 NOTE — Assessment & Plan Note (Signed)
 Legs fatigue easily, feels like her legs are not as strong as they used to be.   - Refer to cardiology for evaluation. - Refer to physical therapy for strengthening exercises.

## 2023-10-27 NOTE — Assessment & Plan Note (Signed)
 Unchanged. Declines surgical intervention at this time.

## 2023-10-27 NOTE — Assessment & Plan Note (Signed)
 EKG tracing is personally reviewed.  EKG notes NSR.  No acute changes.   Could be due to deconditioning, but I would like for her to meet with cardiology to evaluate for underling cardiac issue.

## 2023-10-27 NOTE — Assessment & Plan Note (Signed)
 Not on statin. Update lipid panel.

## 2023-10-27 NOTE — Progress Notes (Signed)
 Subjective:     Patient ID: Angelica Davis, female    DOB: [birth year], 83 y.o.   MRN: [id number]  Chief Complaint  Patient presents with   Annual Exam    HPI  Discussed the use of AI scribe software for clinical note transcription with the patient, who gave verbal consent to proceed.  History of Present Illness  Angelica Davis is an 83 year old female who presents with incontinence and fatigue.  She has ongoing urinary incontinence, which is exacerbated by intermittent coughing. She has not pursued follow-up care and is dissatisfied with previous medication options. She is reluctant to consider surgical intervention.  She experiences swelling in her legs, particularly after a knee replacement, with occasional ankle swelling. She recalls a note mentioning edema, though it was not directly communicated to her.  She describes episodes of needing to use the bathroom immediately after eating, with occasional loose stools. No blood in the stool.  She reports muscle weakness and fatigue, particularly in her legs, which she attributes to lack of exercise. Her heart rate increases with brisk walking, especially uphill, but she denies significant chest pain, only minor discomfort described as 'gas bubbles'.  She has a history of elevated cholesterol and has experienced muscle pain with previous cholesterol medications. She is unsure if her thyroid has been checked recently.  She reports a lack of energy and motivation, stating she could 'sit right there all day', though she does not feel depressed. She experiences leg weakness after standing for extended periods, such as when ironing.  Immunizations: Diet: needs improvement Exercise:  not currently- except stationary bike occasionally Colonoscopy:  aged out Dexa: due Pap Smear: N/A Mammogram: completed yesterday Vision: up to date Dental: up to date     Health Maintenance Due  Topic Date Due   COVID-19 Vaccine (1) Never done    DTaP/Tdap/Td (1 - Tdap) Never done   Zoster Vaccines- Shingrix (1 of 2) Never done   Pneumonia Vaccine 70+ Years old (1 of 1 - PCV) Never done   Medicare Annual Wellness (AWV)  10/22/2023    Past Medical History:  Diagnosis Date   Arthritis    Cataract    bilateral, has had removed   Colitis    Diverticulosis    History of blood transfusion    after birth of child   Hx of colonic polyps - adenomas 08/16/2002   Hyperlipidemia    Skin cancer    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 Jinger Mount   MOHS SURGERY     bridge of nose x 2 (unsure of dates ?2021 and 2018)   PARTIAL KNEE ARTHROPLASTY Right 08/27/2021   Procedure: UNICOMPARTMENTAL KNEE;  Surgeon: Osa Blase, MD;  Location: [facility];  Service: Orthopedics;  Laterality: Right;   TONSILLECTOMY     TOTAL KNEE ARTHROPLASTY Left 12/2008   Dr. Jinger Mount    Family History  Problem Relation Age of Onset   Colon polyps Mother    Colon cancer Mother 72   Dementia Mother    Other Father        rectal polyps   COPD Father    Colon polyps Father    Diverticulitis Father    Diabetes Mellitus II Brother  CVA Maternal Grandmother    Dementia Maternal Grandfather    Arthritis Maternal Grandfather    Heart disease Paternal Grandmother    Dementia Paternal Grandmother    Suicidality Paternal Grandfather    Diabetes Mellitus II Daughter    ADD / ADHD Son    Breast cancer Paternal Aunt    Esophageal cancer Neg Hx    Rectal cancer Neg Hx    Stomach cancer Neg Hx     Social History   Socioeconomic History   Marital status: Divorced    Spouse name: Not on file   Number of children: Not on file   Years of education: Not on file   Highest education level: Not on file  Occupational History   Not on file   Tobacco Use   Smoking status: Never   Smokeless tobacco: Never  Vaping Use   Vaping status: Never Used  Substance and Sexual Activity   Alcohol use: Never   Drug use: Never   Sexual activity: Not Currently    Birth control/protection: Post-menopausal  Other Topics Concern   Not on file  Social History Narrative   Retired, 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)   Divorced   2 daughters and 1 son, all in [place]   5 grandchildren   Completed 12th grade   Enjoys spending time with her mother at SNF, cares for her great grand daughter, 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   Has one dog   Social Drivers of Corporate investment banker Strain: Low Risk  (10/22/2022)   Overall Financial Resource Strain (CARDIA)    Difficulty of Paying Living Expenses: Not hard at all  Food Insecurity: No Food Insecurity (10/22/2022)   Hunger Vital Sign    Worried About Running Out of Food in the Last Year: Never true    Ran Out of Food in the Last Year: Never true  Transportation Needs: No Transportation Needs (10/22/2022)   PRAPARE - Administrator, Civil Service (Medical): No    Lack of Transportation (Non-Medical): No  Physical Activity: Inactive (10/22/2022)   Exercise Vital Sign    Days of Exercise per Week: 0 days    Minutes of Exercise per Session: 0 min  Stress: No Stress Concern Present (10/22/2022)   [employer] of Occupational Health - Occupational Stress Questionnaire    Feeling of Stress : Not at all  Social Connections: Moderately Integrated (10/22/2022)   Social Connection and Isolation Panel [NHANES]    Frequency of Communication with Friends and Family: More than three times a week    Frequency of Social Gatherings with Friends and Family: More than three times a week    Attends Religious Services: More than 4 times per year    Active Member of [employer] or Organizations: Yes    Attends Engineer, structural: More than 4 times per year    Marital Status: Divorced   Intimate Partner Violence: Not At Risk (10/22/2022)   Humiliation, Afraid, Rape, and Kick questionnaire    Fear of Current or Ex-Partner: No    Emotionally Abused: No    Physically Abused: No    Sexually Abused: No    Outpatient Medications Prior to Visit  Medication Sig Dispense Refill   acetaminophen  (TYLENOL ) 500 MG tablet Take 500 mg by mouth every 6 (six) hours as needed.     Ascorbic Acid (VITAMIN C PO) Take 1 tablet by mouth 2 (two) times daily.     fluticasone  (FLONASE )  50 MCG/ACT nasal spray Place 1 spray into both nostrils daily. 16 g 0   TART CHERRY PO Take by mouth.     VITAMIN D PO Take 1 capsule by mouth daily.     Cyanocobalamin (VITAMIN B 12 PO) Take by mouth.     thiamine (VITAMIN B-1) 100 MG tablet Take 100 mg by mouth daily.     No facility-administered medications prior to visit.    Allergies  Allergen Reactions   Fenofibrate Other (See Comments)    Other reaction(s): Leg aches   Lovastatin Other (See Comments)    Other reaction(s): Leg aches   Pravastatin Other (See Comments)    Other reaction(s): Leg aches    Review of Systems  Constitutional:  Negative for weight loss.  HENT:  Positive for hearing loss. Negative for congestion.        Not ready for hearing eval  Eyes:  Negative for blurred vision.  Respiratory:  Negative for cough.   Cardiovascular:  Positive for leg swelling.  Gastrointestinal:  Positive for diarrhea (occasionally). Negative for blood in stool and constipation.  Genitourinary:  Negative for dysuria, frequency and hematuria.  Musculoskeletal:  Negative for myalgias.       Legs fatigue easily- no pain  Skin:  Negative for rash.  Neurological:  Negative for headaches.  Psychiatric/Behavioral:         Denies depression/anxiety       Objective:    Physical Exam   BP (!) 144/63 (BP Location: Right Arm, Patient Position: Sitting, Cuff Size: Large)   Pulse 75   Temp 98.8 F (37.1 C) (Oral)   Resp 16   Ht 5' 2.5" (1.588 m)    Wt 226 lb (102.5 kg)   LMP 07/07/2004   SpO2 97%   BMI 40.68 kg/m  Wt Readings from Last 3 Encounters:  10/27/23 226 lb (102.5 kg)  09/17/22 225 lb (102.1 kg)  08/11/22 225 lb (102.1 kg)   Physical Exam  Constitutional: She is oriented to person, place, and time. She appears well-developed and well-nourished. No distress.  HENT:  Head: Normocephalic and atraumatic.  Right Ear: Tympanic membrane and ear canal normal.  Left Ear: Tympanic membrane and ear canal normal.  Mouth/Throat: Oropharynx is clear and moist.  Eyes: Pupils are equal, round, and reactive to light. No scleral icterus.  Neck: Normal range of motion. No thyromegaly present.  Cardiovascular: Normal rate and regular rhythm.   No murmur heard. Pulmonary/Chest: Effort normal and breath sounds normal. No respiratory distress. He has no wheezes. She has no rales. She exhibits no tenderness.  Abdominal: Soft. Bowel sounds are normal. She exhibits no distension and no mass. There is no tenderness. There is no rebound and no guarding.  Musculoskeletal: She exhibits 1+ bilateral LE edema.  Lymphadenopathy:    She has no cervical adenopathy.  Neurological: She is alert and oriented to person, place, and time. She has normal patellar reflexes. She exhibits normal muscle tone. Coordination normal.  Skin: Skin is warm and dry.  Psychiatric: She has a normal mood and affect. Her behavior is normal. Judgment and thought content normal.  Breast/Pelvic: deferred           Assessment & Plan:       Assessment & Plan:   Problem List Items Addressed This Visit       Unprioritized   Urinary incontinence   Unchanged. Declines surgical intervention at this time.       Preventative health care - Primary  Routine wellness visit. Discussed general health maintenance healthy diet and exercise. Recent bereavement affecting diet and exercise routine. Colonoscopy not needed unless symptoms develop based on age. Mammogram results  pending. - Request immunization records from [facility] and health department. She reports that she has had pneumonia vaccine and shingles series.  - Repeat bone density scan. - Order labs including cholesterol, thyroid, and anemia screening.      Hyperlipidemia   Not on statin. Update lipid panel.       Relevant Orders   Comp Met (CMET)   Lipid panel   Fatigue   Legs fatigue easily, feels like her legs are not as strong as they used to be.   - Refer to cardiology for evaluation. - Refer to physical therapy for strengthening exercises.       Relevant Orders   CBC w/Diff   TSH   Ambulatory referral to Physical Therapy   Ambulatory referral to Cardiology   Dyspnea on exertion   EKG tracing is personally reviewed.  EKG notes NSR.  No acute changes.   Could be due to deconditioning, but I would like for her to meet with cardiology to evaluate for underling cardiac issue.       Relevant Orders   EKG 12-Lead (Completed)   Other Visit Diagnoses       Postmenopausal estrogen deficiency       Relevant Orders   DG Bone Density       I have discontinued Quenna R. Potts's Cyanocobalamin (VITAMIN B 12 PO) and thiamine. I am also having her maintain her VITAMIN D PO, Ascorbic Acid (VITAMIN C PO), TART CHERRY PO, acetaminophen , and fluticasone .  No orders of the defined types were placed in this encounter.

## 2023-10-29 ENCOUNTER — Encounter: Payer: Self-pay | Admitting: Family

## 2023-11-10 ENCOUNTER — Ambulatory Visit (INDEPENDENT_AMBULATORY_CARE_PROVIDER_SITE_OTHER)

## 2023-11-10 VITALS — Ht 62.5 in | Wt 226.0 lb

## 2023-11-10 DIAGNOSIS — Z0000 Encounter for general adult medical examination without abnormal findings: Secondary | ICD-10-CM | POA: Diagnosis not present

## 2023-11-10 NOTE — Patient Instructions (Addendum)
 Angelica Davis , Thank you for taking time to come for your Medicare Wellness Visit. I appreciate your ongoing commitment to your health goals. Please review the following plan we discussed and let me know if I can assist you in the future.   Referrals/Orders/Follow-Ups/Clinician Recommendations:   This is a list of the screening recommended for you and due dates:  Health Maintenance  Topic Date Due   COVID-19 Vaccine (1) Never done   DTaP/Tdap/Td vaccine (1 - Tdap) Never done   Zoster (Shingles) Vaccine (1 of 2) Never done   Pneumonia Vaccine (1 of 1 - PCV) Never done   Flu Shot  02/05/2024   Medicare Annual Wellness Visit  11/09/2024   Colon Cancer Screening  06/24/2025   DEXA scan (bone density measurement)  Completed   HPV Vaccine  Aged Out   Meningitis B Vaccine  Aged Out    Advanced directives: (Declined) Advance directive discussed with you today. Even though you declined this today, please call our office should you change your mind, and we can give you the proper paperwork for you to fill out.  Next Medicare Annual Wellness Visit scheduled for next year: Yes

## 2023-11-10 NOTE — Progress Notes (Signed)
 Subjective:   Angelica Davis is a 83 y.o. who presents for a Medicare Wellness preventive visit.  Visit Complete: Virtual I connected with  Angelica Davis on 11/10/23 by a audio enabled telemedicine application and verified that I am speaking with the correct person using two identifiers.  Patient Location: Home  Provider Location: Home Office  I discussed the limitations of evaluation and management by telemedicine. The patient expressed understanding and agreed to proceed.  Vital Signs: Because this visit was a virtual/telehealth visit, some criteria may be missing or patient reported. Any vitals not documented were not able to be obtained and vitals that have been documented are patient reported.    Persons Participating in Visit: Patient.  AWV Questionnaire: No: Patient Medicare AWV questionnaire was not completed prior to this visit.  Cardiac Risk Factors include: advanced age (>5men, >74 women)     Objective:    Today's Vitals   11/10/23 1554  Weight: 226 lb (102.5 kg)  Height: 5' 2.5" (1.588 m)   Body mass index is 40.68 kg/m.     11/10/2023    4:02 PM 10/22/2022    1:53 PM 09/02/2021    8:49 AM 08/16/2021    1:23 PM 04/28/2014   10:25 AM  Advanced Directives  Does Patient Have a Medical Advance Directive? No No No No No  Would patient like information on creating a medical advance directive? No - Patient declined No - Patient declined Yes (MAU/Ambulatory/Procedural Areas - Information given)      Current Medications (verified) Outpatient Encounter Medications as of 11/10/2023  Medication Sig   acetaminophen  (TYLENOL ) 500 MG tablet Take 500 mg by mouth every 6 (six) hours as needed.   Ascorbic Acid (VITAMIN C PO) Take 1 tablet by mouth 2 (two) times daily.   fluticasone  (FLONASE ) 50 MCG/ACT nasal spray Place 1 spray into both nostrils daily.   TART CHERRY PO Take by mouth.   VITAMIN D PO Take 1 capsule by mouth daily.   No facility-administered encounter  medications on file as of 11/10/2023.    Allergies (verified) Fenofibrate, Lovastatin, and Pravastatin   History: Past Medical History:  Diagnosis Date   Arthritis    Cataract    bilateral, has had removed   Colitis    Diverticulosis    History of blood transfusion    after birth of child   Hx of colonic polyps - adenomas 08/16/2002   Hyperlipidemia    Skin cancer    skin- On bridge of nose, basal cell carcinoma per patient   Past Surgical History:  Procedure Laterality Date   CATARACT EXTRACTION Bilateral 01/13/2019   COLONOSCOPY  last 05/12/2014   polyps removed 2015, 2020 TA x1   KNEE ARTHROSCOPY Left 06/2007   Dr. Jinger Mount   MOHS SURGERY     bridge of nose x 2 (unsure of dates ?2021 and 2018)   PARTIAL KNEE ARTHROPLASTY Right 08/27/2021   Procedure: UNICOMPARTMENTAL KNEE;  Surgeon: Osa Blase, MD;  Location: [facility];  Service: Orthopedics;  Laterality: Right;   TONSILLECTOMY     TOTAL KNEE ARTHROPLASTY Left 12/2008   Dr. Jinger Mount   Family History  Problem Relation Age of Onset   Colon polyps Mother    Colon cancer Mother 60   Dementia Mother    Other Father        rectal polyps   COPD Father    Colon polyps Father    Diverticulitis Father    Diabetes Mellitus II Brother  CVA Maternal Grandmother    Dementia Maternal Grandfather    Arthritis Maternal Grandfather    Heart disease Paternal Grandmother    Dementia Paternal Grandmother    Suicidality Paternal Grandfather    Diabetes Mellitus II Daughter    ADD / ADHD Son    Breast cancer Paternal Aunt    Esophageal cancer Neg Hx    Rectal cancer Neg Hx    Stomach cancer Neg Hx    Social History   Socioeconomic History   Marital status: Divorced    Spouse name: Not on file   Number of children: Not on file   Years of education: Not on file   Highest education level: Not on file  Occupational History   Not on file  Tobacco Use   Smoking status: Never   Smokeless tobacco: Never  Vaping Use   Vaping  status: Never Used  Substance and Sexual Activity   Alcohol use: Never   Drug use: Never   Sexual activity: Not Currently    Birth control/protection: Post-menopausal  Other Topics Concern   Not on file  Social History Narrative   Retired, 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)   Divorced   2 daughters and 1 son, all in [place]   5 grandchildren   Completed 12th grade   Enjoys spending time with her mother at SNF, cares for her great grand daughter, 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   Has one dog   Social Drivers of Corporate investment banker Strain: Low Risk  (11/10/2023)   Overall Financial Resource Strain (CARDIA)    Difficulty of Paying Living Expenses: Not hard at all  Food Insecurity: No Food Insecurity (11/10/2023)   Hunger Vital Sign    Worried About Running Out of Food in the Last Year: Never true    Ran Out of Food in the Last Year: Never true  Transportation Needs: No Transportation Needs (11/10/2023)   PRAPARE - Administrator, Civil Service (Medical): No    Lack of Transportation (Non-Medical): No  Physical Activity: Inactive (11/10/2023)   Exercise Vital Sign    Days of Exercise per Week: 0 days    Minutes of Exercise per Session: 0 min  Stress: No Stress Concern Present (11/10/2023)   [employer] of Occupational Health - Occupational Stress Questionnaire    Feeling of Stress : Not at all  Social Connections: Moderately Integrated (11/10/2023)   Social Connection and Isolation Panel [NHANES]    Frequency of Communication with Friends and Family: More than three times a week    Frequency of Social Gatherings with Friends and Family: More than three times a week    Attends Religious Services: More than 4 times per year    Active Member of [employer] or Organizations: Yes    Attends Engineer, structural: More than 4 times per year    Marital Status: Divorced    Tobacco Counseling Counseling given: Not Answered    Clinical Intake:  Pre-visit  preparation completed: Yes  Pain : No/denies pain     BMI - recorded: 40.68 Nutritional Status: BMI > 30  Obese Nutritional Risks: None Diabetes: No  No results found for: "HGBA1C"   How often do you need to have someone help you when you read instructions, pamphlets, or other written materials from your doctor or pharmacy?: 1 - Never  Interpreter Needed?: No  Information entered by :: Farris Hong LPN   Activities of Daily Living  11/10/2023    3:59 PM  In your present state of health, do you have any difficulty performing the following activities:  Hearing? 0  Vision? 0  Difficulty concentrating or making decisions? 0  Walking or climbing stairs? 0  Dressing or bathing? 0  Doing errands, shopping? 0  Preparing Food and eating ? N  Using the Toilet? N  In the past six months, have you accidently leaked urine? Y  Comment Wears pads. Followed by medical attention  Do you have problems with loss of bowel control? N  Managing your Medications? N  Managing your Finances? N  Housekeeping or managing your Housekeeping? N    Patient Care Team: Dorrene Gaucher, NP as PCP - General (Internal Medicine) Lillian Rein, MD as Consulting Physician (Gynecology)  Indicate any recent Medical Services you may have received from other than [facility] providers in the past year (date may be approximate).     Assessment:   This is a routine wellness examination for Hickman.  Hearing/Vision screen Hearing Screening - Comments:: Denies hearing difficulties   Vision Screening - Comments:: Wears rx glasses - up to date with routine eye exams with  [facility]   Goals Addressed               This Visit's Progress     Increase physical activity (pt-stated)        Remain active.       Depression Screen     11/10/2023    3:58 PM 10/27/2023   11:40 AM 10/22/2022    1:58 PM 09/17/2022    9:59 AM  PHQ 2/9 Scores  PHQ - 2 Score 0 0 0 0  PHQ- 9 Score    0    Fall Risk      11/10/2023    4:00 PM 10/27/2023   11:40 AM 10/22/2022    1:55 PM 09/17/2022    9:59 AM  Fall Risk   Falls in the past year? 0 0 0 0  Number falls in past yr: 0 0 0 0  Injury with Fall? 0 0 0 0  Risk for fall due to : No Fall Risks No Fall Risks No Fall Risks No Fall Risks  Follow up Falls prevention discussed;Falls evaluation completed Falls evaluation completed Falls evaluation completed Falls evaluation completed    MEDICARE RISK AT HOME:  Medicare Risk at Home Any stairs in or around the home?: Yes If so, are there any without handrails?: No Home free of loose throw rugs in walkways, pet beds, electrical cords, etc?: Yes Adequate lighting in your home to reduce risk of falls?: Yes Life alert?: No Use of a cane, walker or w/c?: No Grab bars in the bathroom?: No Shower chair or bench in shower?: Yes Elevated toilet seat or a handicapped toilet?: Yes  TIMED UP AND GO:  Was the test performed?  No  Cognitive Function: 6CIT completed        11/10/2023    4:02 PM 10/22/2022    2:03 PM  6CIT Screen  What Year? 0 points 0 points  What month? 0 points 0 points  What time? 0 points 0 points  Count back from 20 0 points 0 points  Months in reverse 0 points 0 points  Repeat phrase 0 points 0 points  Total Score 0 points 0 points    Immunizations  There is no immunization history on file for this patient.  Screening Tests Health Maintenance  Topic Date Due  COVID-19 Vaccine (1) Never done   DTaP/Tdap/Td (1 - Tdap) Never done   Zoster Vaccines- Shingrix (1 of 2) Never done   Pneumonia Vaccine 28+ Years old (1 of 1 - PCV) Never done   INFLUENZA VACCINE  02/05/2024   Medicare Annual Wellness (AWV)  11/09/2024   Colonoscopy  06/24/2025   DEXA SCAN  Completed   HPV VACCINES  Aged Out   Meningococcal B Vaccine  Aged Out    Health Maintenance  Health Maintenance Due  Topic Date Due   COVID-19 Vaccine (1) Never done   DTaP/Tdap/Td (1 - Tdap) Never done   Zoster  Vaccines- Shingrix (1 of 2) Never done   Pneumonia Vaccine 75+ Years old (1 of 1 - PCV) Never done   Health Maintenance Items Addressed: Vaccines deferred  Additional Screening:  Vision Screening: Recommended annual ophthalmology exams for early detection of glaucoma and other disorders of the eye.  Dental Screening: Recommended annual dental exams for proper oral hygiene  Community Resource Referral / Chronic Care Management: CRR required this visit?  No   CCM required this visit?  No     Plan:     I have personally reviewed and noted the following in the patient's chart:   Medical and social history Use of alcohol, tobacco or illicit drugs  Current medications and supplements including opioid prescriptions. Patient is not currently taking opioid prescriptions. Functional ability and status Nutritional status Physical activity Advanced directives List of other physicians Hospitalizations, surgeries, and ER visits in previous 12 months Vitals Screenings to include cognitive, depression, and falls Referrals and appointments  In addition, I have reviewed and discussed with patient certain preventive protocols, quality metrics, and best practice recommendations. A written personalized care plan for preventive services as well as general preventive health recommendations were provided to patient.     RENELLE REVIERE, LPN   07/12/1094   After Visit Summary: (MyChart) Due to this being a telephonic visit, the after visit summary with patients personalized plan was offered to patient via MyChart   Notes: Nothing significant to report at this time.

## 2023-11-10 NOTE — Therapy (Incomplete)
 OUTPATIENT PHYSICAL THERAPY NEURO EVALUATION   Patient Name: Angelica Davis MRN: [id number] DOB:Dec 23, 1940, 83 y.o., female Today's Date: 11/10/2023   END OF SESSION:   Past Medical History:  Diagnosis Date   Arthritis    Cataract    bilateral, has had removed   Colitis    Diverticulosis    History of blood transfusion    after birth of child   Hx of colonic polyps - adenomas 08/16/2002   Hyperlipidemia    Skin cancer    skin- On bridge of nose, basal cell carcinoma per patient   Past Surgical History:  Procedure Laterality Date   CATARACT EXTRACTION Bilateral 01/13/2019   COLONOSCOPY  last 05/12/2014   polyps removed 2015, 2020 TA x1   KNEE ARTHROSCOPY Left 06/2007   Dr. Jinger Mount   MOHS SURGERY     bridge of nose x 2 (unsure of dates ?2021 and 2018)   PARTIAL KNEE ARTHROPLASTY Right 08/27/2021   Procedure: UNICOMPARTMENTAL KNEE;  Surgeon: Osa Blase, MD;  Location: [facility];  Service: Orthopedics;  Laterality: Right;   TONSILLECTOMY     TOTAL KNEE ARTHROPLASTY Left 12/2008   Dr. Jinger Mount   Patient Active Problem List   Diagnosis Date Noted   Dyspnea on exertion 10/27/2023   Fatigue 10/27/2023   Preventative health care 10/27/2023   Skin cancer 09/17/2022   Urinary incontinence 09/17/2022   Cough 09/17/2022   S/P right unicompartmental knee replacement 08/27/2021   Osteoarthritis of right knee 08/06/2021   Pain of left hand 11/16/2019   Trigger finger of left hand 11/16/2019   Hyperlipidemia 09/04/2015   Hx of colonic polyps - adenomas 08/16/2002    PCP: Dorrene Gaucher, NP   REFERRING PROVIDER: Dorrene Gaucher, NP   REFERRING DIAG: R53.83 (ICD-10-CM) - Fatigue, unspecified type  C/O LE weakness, please evaluate for strength/balance.   THERAPY DIAG:  No diagnosis found.  RATIONALE FOR EVALUATION AND TREATMENT: Rehabilitation  ONSET DATE: ***  NEXT MD VISIT: ***   SUBJECTIVE:                                                                                                                                                                                                          SUBJECTIVE STATEMENT: ***  Pt accompanied by: {accompnied:27141}  PAIN: Are you having pain? {OPRCPAIN:27236}  PERTINENT HISTORY:  ***arthritis, L TKA 12/2008, R unicompartmental knee replacement 08/2021, DOE, fatigue, skin cancer, diverticulosis, h/o L frozen shoulder  PRECAUTIONS: {Therapy precautions:24002}  RED FLAGS: {PT Red Flags:29287}  WEIGHT BEARING RESTRICTIONS: {Yes ***/No:24003}  FALLS:  Has patient fallen in last 6 months? {fallsyesno:27318}  LIVING ENVIRONMENT: ***as of 2023*** Lives with: lives alone Lives in: House/apartment Stairs: Yes: External: *** steps; {rails:26871} Has following equipment at home: Single point cane and Walker - 2 wheeled  OCCUPATION: ***Retired  PLOF: {PLOF:24004}  PATIENT GOALS: ***   OBJECTIVE: (objective measures completed at initial evaluation unless otherwise dated)  DIAGNOSTIC FINDINGS:  ***Bone density study scheduled for 11/24/23  COGNITION: Overall cognitive status: {cognition:24006}   SENSATION: {sensation:27233}  COORDINATION: ***  EDEMA:  {edema:24020}  MUSCLE TONE: {LE tone:25568}  DTRs:  {DTR SITE:24025}  POSTURE:  {posture:25561}  MUSCLE LENGTH: Hamstrings: Right *** deg; Left *** deg Thomas test: Right *** deg; Left *** deg Hamstrings: *** ITB: *** Piriformis: *** Hip flexors: *** Quads: *** Heelcord: ***  LOWER EXTREMITY ROM:     {AROM/PROM:27142}  Right eval Left eval  Hip flexion    Hip extension    Hip abduction    Hip adduction    Hip internal rotation    Hip external rotation    Knee flexion    Knee extension    Ankle dorsiflexion    Ankle plantarflexion    Ankle inversion    Ankle eversion     (Blank rows = not tested)  LOWER EXTREMITY MMT:    MMT Right eval Left eval  Hip flexion    Hip extension    Hip abduction    Hip adduction     Hip internal rotation    Hip external rotation    Knee flexion    Knee extension    Ankle dorsiflexion    Ankle plantarflexion    Ankle inversion    Ankle eversion    (Blank rows = not tested)  BED MOBILITY:  {Bed mobility:24027}  TRANSFERS: Assistive device utilized: {Assistive devices:23999}  Sit to stand: {Levels of assistance:24026} Stand to sit: {Levels of assistance:24026} Chair to chair: {Levels of assistance:24026} Floor: {Levels of assistance:24026}  GAIT: Distance walked: *** Assistive device utilized: {Assistive devices:23999} Level of assistance: {Levels of assistance:24026} Gait pattern: {gait characteristics:25376} Comments: ***  RAMP: Level of Assistance: {Levels of assistance:24026} Assistive device utilized: {Assistive devices:23999} Ramp Comments: ***  CURB:  Level of Assistance: {Levels of assistance:24026} Assistive device utilized: {Assistive devices:23999} Curb Comments: ***  STAIRS:  Level of Assistance: {Levels of assistance:24026}  Stair Negotiation Technique: {Stair Technique:27161} with {Rail Assistance:27162}  Number of Stairs: ***   Height of Stairs: ***  Comments: ***  FUNCTIONAL TESTS:  {Functional tests:24029}  PATIENT SURVEYS:  {rehab surveys:24030}   TODAY'S TREATMENT:   ***   PATIENT EDUCATION:  Education details: {Education details:27468}  Person educated: {Person educated:25204} Education method: {Education Method:25205} Education comprehension: {Education Comprehension:25206}  HOME EXERCISE PROGRAM: ***   ASSESSMENT:  CLINICAL IMPRESSION: Angelica Davis is a 83 y.o. female who was referred to physical therapy for evaluation and treatment for ***.  Patient presents with physical impairments of impaired activity tolerance, impaired standing balance, impaired ambulation, and decreased safety awareness impacting safe and independent functional mobility.  Examination revealed patient is at risk for falls and  functional decline as evidenced by the following objective test measures: Gait speed *** ft/sec, (2.62 ft/sec is needed for community access), mCTSIB: position 1: *** sec, position 2: *** sec, position 3: *** sec, position 4: *** sec (30sec in each position demonstrates equal weighting of balance systems), TUG of *** sec (>13.5 sec indicates increased risk for falls), and 5xSTS of *** sec (>15 sec indicates increased risk for falls  and decreased BLE power). Angelica Davis will benefit from skilled PT to address above deficits to improve mobility and activity tolerance to help reach the maximal level of functional independence and mobility. Patient demonstrates understanding of this POC and is in agreement with this plan.   OBJECTIVE IMPAIRMENTS: {opptimpairments:25111}.   ACTIVITY LIMITATIONS: {activitylimitations:27494}  PARTICIPATION LIMITATIONS: {participationrestrictions:25113}  PERSONAL FACTORS: {Personal factors:25162} are also affecting patient's functional outcome.   REHAB POTENTIAL: {rehabpotential:25112}  CLINICAL DECISION MAKING: {clinical decision making:25114}  EVALUATION COMPLEXITY: {Evaluation complexity:25115}   GOALS: Goals reviewed with patient? {yes/no:20286}  SHORT TERM GOALS: Target date: ***  Patient will be independent with initial HEP to improve outcomes and carryover.  Baseline: *** Goal status: {GOALSTATUS:25110}  2.  Patient will be educated on strategies to decrease risk of falls.  Baseline: *** Goal status: {GOALSTATUS:25110}  3.  Patient will demonstrate decreased TUG time to </= *** sec to decrease risk for falls with transitional mobility. Baseline: *** Goal status: {GOALSTATUS:25110}  LONG TERM GOALS: Target date: ***  Patient will be independent with advanced/ongoing HEP to facilitate ability to maintain/progress functional gains from skilled physical therapy services. Baseline: *** Goal status: {GOALSTATUS:25110}  2.  Patient will be able to ambulate  600' with or w/o LRAD on variable surfaces with good safety to access community.  Baseline: *** Goal status: {GOALSTATUS:25110}  3.  Patient will be able to step up/down curb safely with LRAD for safety with community ambulation.  Baseline: *** Goal status: {GOALSTATUS:25110}   4.  Patient will demonstrate improved *** strength to >/= ***/5 for improved stability and ease of mobility . Baseline: *** Goal status: {GOALSTATUS:25110}  5.  Patient will improve 5xSTS time to </= *** seconds for improved efficiency and safety with transfers. Baseline: *** Goal status: {GOALSTATUS:25110}   6.  Patient will demonstrate gait speed of >/= 1.8 ft/sec (0.55 m/s) to be a safe limited community ambulator with decreased risk for recurrent falls.  Baseline: *** Goal status: {GOALSTATUS:25110}  7.  Patient will improve Berg score to >/= ***/56 to improve safety and stability with ADLs in standing and reduce risk for falls. (MCID= 8 points)  Baseline: *** Goal status: {GOALSTATUS:25110}  8.  Patient will demonstrate at least ***19/24 on DGI to improve gait stability and reduce risk for falls. Baseline: *** Goal status: {GOALSTATUS:25110}  9. Patient will improve FGA score to at least ***19/30 to improve gait stability and reduce risk for falls. Baseline: *** Goal status: {GOALSTATUS:25110}  10.  Patient will report >/= ***% on ABC scale (MCID = 19%) to demonstrate improved balance confidence with functional mobility and gait. Baseline: *** Goal status: {GOALSTATUS:25110}   PLAN:  PT FREQUENCY: {rehab frequency:25116}  PT DURATION: {rehab duration:25117}  PLANNED INTERVENTIONS: {rehab planned interventions:25118::"97110-Therapeutic exercises","97530- Therapeutic [phone]- Neuromuscular re-education","97535- Self QMVH","84696- Manual therapy"}  PLAN FOR NEXT SESSION: ***   Francisco Irving, PT 11/10/2023, 10:07 AM

## 2023-11-12 ENCOUNTER — Telehealth (HOSPITAL_BASED_OUTPATIENT_CLINIC_OR_DEPARTMENT_OTHER): Payer: Self-pay

## 2023-11-17 ENCOUNTER — Ambulatory Visit: Admitting: Physical Therapy

## 2023-11-24 ENCOUNTER — Other Ambulatory Visit (HOSPITAL_BASED_OUTPATIENT_CLINIC_OR_DEPARTMENT_OTHER)

## 2023-11-25 NOTE — Therapy (Signed)
 OUTPATIENT PHYSICAL THERAPY NEURO EVALUATION   Patient Name: Angelica Davis MRN: [id number] DOB:1940-08-26, 83 y.o., female Today's Date: 12/07/2023   END OF SESSION:  PT End of Session - 12/07/23 1011     Visit Number 1    Date for PT Re-Evaluation 02/01/24    Authorization Type [employer]    Authorization Time Period auth pending    Authorization - Visit Number 1    Progress Note Due on Visit 10    PT Start Time 1011    PT Stop Time 1104    PT Time Calculation (min) 53 min    Activity Tolerance Patient tolerated treatment well    Behavior During Therapy WFL for tasks assessed/performed             Past Medical History:  Diagnosis Date   Arthritis    Cataract    bilateral, has had removed   Colitis    Diverticulosis    History of blood transfusion    after birth of child   Hx of colonic polyps - adenomas 08/16/2002   Hyperlipidemia    Skin cancer    skin- On bridge of nose, basal cell carcinoma per patient   Past Surgical History:  Procedure Laterality Date   CATARACT EXTRACTION Bilateral 01/13/2019   COLONOSCOPY  last 05/12/2014   polyps removed 2015, 2020 TA x1   KNEE ARTHROSCOPY Left 06/2007   Dr. Jinger Mount   MOHS SURGERY     bridge of nose x 2 (unsure of dates ?2021 and 2018)   PARTIAL KNEE ARTHROPLASTY Right 08/27/2021   Procedure: UNICOMPARTMENTAL KNEE;  Surgeon: Osa Blase, MD;  Location: [facility];  Service: Orthopedics;  Laterality: Right;   TONSILLECTOMY     TOTAL KNEE ARTHROPLASTY Left 12/2008   Dr. Jinger Mount   Patient Active Problem List   Diagnosis Date Noted   Dyspnea on exertion 10/27/2023   Fatigue 10/27/2023   Preventative health care 10/27/2023   Skin cancer 09/17/2022   Urinary incontinence 09/17/2022   Cough 09/17/2022   S/P right unicompartmental knee replacement 08/27/2021   Osteoarthritis of right knee 08/06/2021   Pain of left hand 11/16/2019   Trigger finger of left hand 11/16/2019   Hyperlipidemia 09/04/2015   Hx of colonic  polyps - adenomas 08/16/2002    PCP: Dorrene Gaucher, NP   REFERRING PROVIDER: Dorrene Gaucher, NP   REFERRING DIAG: R53.83 (ICD-10-CM) - Fatigue, unspecified type   C/o LE weakness, please evaluate for strength/balance.   THERAPY DIAG:  Muscle weakness (generalized)  Unsteadiness on feet  History of falling  Difficulty in walking, not elsewhere classified  RATIONALE FOR EVALUATION AND TREATMENT: Rehabilitation  ONSET DATE: up to 1 yr  NEXT MD VISIT: 11/15/2024   SUBJECTIVE:  SUBJECTIVE STATEMENT: Pt reports her house is [place] a [place] - every time she has to take the trash out or go for the mail she finds herself feeling weak and out of breath.  She volunteers at [employer] where she slid/fell out of a chair to the floor ~1 month ago and could not get up.  PAIN: Are you having pain? Yes: NPRS scale: 0/10 currently, up to 4-5/10  Pain location: L lateral knee  Pain description: occasional throbbing  Aggravating factors: unpredictable - sometimes sitting or prolonged standing but not consistent  Relieving factors: resolves on its own after 10-15 minutes   PERTINENT HISTORY:  Arthritis, R unicompartmental knee replacement 08/2021, L TKA 12/2008, colitis, diverticulosis, HLD, skin cancer, DOE, fatigue, urinary incontinence  PRECAUTIONS: Fall  RED FLAGS: None  WEIGHT BEARING RESTRICTIONS: No  FALLS:  Has patient fallen in last 6 months? Yes. Number of falls 1 - fall out of a chair/stool   LIVING ENVIRONMENT: Lives with: lives alone Lives in: House/apartment Stairs: Yes: External: 5-6 steps; on right going up, on left going up, and can reach both Has following equipment at home: Single point cane, Walker - 2 wheeled, and Ramped entry  OCCUPATION: Retired  PLOF:  Independent and Leisure: no regular exercise, Comptroller at [employer]   PATIENT GOALS: "To be able t walk/move around confidently."   OBJECTIVE: (objective measures completed at initial evaluation unless otherwise dated)  DIAGNOSTIC FINDINGS:  N/A  COGNITION: Overall cognitive status: Within functional limits for tasks assessed   SENSATION: WFL  COORDINATION: LE gross motor WFL  POSTURE:  rounded shoulders, forward head, and flexed trunk   MUSCLE LENGTH: Hamstrings: mild/mod tight B ITB: mild/mod tight B Piriformis: mod tight B Hip flexors: mod tight B Quads: mild/mod tight B Heelcord: mild/mod tight B  LOWER EXTREMITY ROM:    B knee grossly 0-115  LOWER EXTREMITY MMT:    MMT Right eval Left eval  Hip flexion 4- 4-  Hip extension 4- 4-  Hip abduction 4- 4-  Hip adduction 4- 4-  Hip internal rotation 4- 4-  Hip external rotation 4- 4  Knee flexion 3+ 3+  Knee extension 4+ 4+  Ankle dorsiflexion 4 4  Ankle plantarflexion    Ankle inversion    Ankle eversion    (Blank rows = not tested)  BED MOBILITY:  Independent  TRANSFERS: Assistive device utilized: None  Sit to stand: Complete Independence Stand to sit: Complete Independence Chair to chair: Complete Independence Floor: NT  GAIT: Distance walked: Clinic distances Assistive device utilized: None Level of assistance: Complete Independence Gait pattern: WFL   FUNCTIONAL TESTS:  5 times sit to stand: 17.03 sec Timed up and go (TUG): 9.28 sec 6 minute walk test: TBA 10 meter walk test: 8.93 sec; Gait speed = 3.67 ft/sec Berg Balance Scale:  TBA Functional gait assessment: TBA  PATIENT SURVEYS:  ABC scale 1310 / 1600 = 81.9 %; >80% indicates a high level of physical functioning   TODAY'S TREATMENT:   12/07/2023  SELF CARE:  Reviewed eval findings and role of PT in addressing identified deficits as well as instruction in initial HEP (see below).    PATIENT EDUCATION:  Education details: PT  eval findings, anticipated POC, need for further assessment of balance, and initial HEP  Person educated: Patient Education method: Explanation, Demonstration, Verbal cues, and Handouts Education comprehension: verbalized understanding, returned demonstration, verbal cues required, and needs further education  HOME EXERCISE PROGRAM: Access Code: [email or web address] URL: [email or web address] Date: 12/07/2023  Prepared by: Felecia Hopper  Exercises - Clam with Resistance  - 1 x daily - 5-7 x weekly - 2 sets - 10 reps - 3-5 sec hold - Standing Hip Abduction with Resistance at Thighs  - 1 x daily - 5-7 x weekly - 2 sets - 10 reps - 3 sec hold - Standing Hip Extension with Resistance at Ankles and Counter Support  - 1 x daily - 5-7 x weekly - 2 sets - 10 reps - 3 sec hold - Marching with Resistance  - 1 x daily - 5-7 x weekly - 2 sets - 10 reps - 3 sec hold - Seated Hamstring Curls with Resistance  - 1 x daily - 5-7 x weekly - 2 sets - 10 reps - 3 sec hold   ASSESSMENT:  CLINICAL IMPRESSION: ANAHLA BEVIS is a 83 y.o. female who was referred to physical therapy for evaluation and treatment for fatigue with associated LE weakness.  Patient presents with physical impairments of impaired activity tolerance, impaired standing balance, impaired ambulation, and decreased safety awareness impacting safe and independent functional mobility.  Examination revealed gait speed of 3.67 ft/sec, (2.62 ft/sec is needed for community access) and TUG of 9.28 sec (>13.5 sec indicates increased risk for falls) WNL however increased risk for falls and functional decline as evidenced by the following objective test measures:  5xSTS of 17.03 sec (>15 sec indicates increased risk for falls and decreased BLE power). Further assessment of Berg, FGA and 6 minute walk test to be completed next visit.  Curley will benefit from skilled PT to address above deficits to improve mobility and activity tolerance to help reach the  maximal level of functional independence and mobility. Patient demonstrates understanding of this POC and is in agreement with this plan.   OBJECTIVE IMPAIRMENTS: Abnormal gait, decreased activity tolerance, decreased balance, decreased mobility, difficulty walking, decreased strength, decreased safety awareness, increased fascial restrictions, impaired perceived functional ability, increased muscle spasms, improper body mechanics, postural dysfunction, and pain.   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   REHAB POTENTIAL: Good  CLINICAL DECISION MAKING: Evolving/moderate complexity  EVALUATION COMPLEXITY: Moderate   GOALS: Goals reviewed with patient? Yes  SHORT TERM GOALS: Target date: 01/04/2024  Patient will be independent with initial HEP to improve outcomes and carryover.  Baseline:  Goal status: INITIAL  2.  Patient will be educated on strategies to decrease risk of falls.  Baseline:  Goal status: INITIAL  3.  Patient will improve 5xSTS time to </= 15 seconds for improved efficiency and safety with transfers. Baseline: 17.03 sec Goal status: INITIAL  LONG TERM GOALS: Target date: 02/01/2024  Patient will be independent with advanced/ongoing HEP to facilitate ability to maintain/progress functional gains from skilled physical therapy services. Baseline:  Goal status: INITIAL  2.  Patient will be able to ambulate 600' with or w/o LRAD on variable surfaces including curb and stair negotiation  with single rail with good safety to access community.  Baseline:  Goal status: INITIAL  3. Patient will demonstrate improved B LE strength to >/= 4 to 4+/5 for improved stability and ease of mobility . Baseline: Refer to above LE MMT table  Goal status: INITIAL  4.  Patient will improve Berg score by at least 8 points or to >/= 52/56 to improve safety and stability with ADLs in standing and reduce risk for falls. (MCID= 8 points)  Baseline: TBA Goal status: INITIAL  5. Patient will improve FGA score by at least 4 points or to at least 21/30 to improve gait stability and reduce risk for falls. Baseline: TBA Goal status: INITIAL   PLAN:  PT FREQUENCY: 2x/week  PT DURATION: 8 weeks  PLANNED INTERVENTIONS: 97164- PT Re-evaluation, 97750- Physical Performance Testing, 97110-Therapeutic exercises, 97530- Therapeutic activity, [id number]- Neuromuscular re-education, 97535- Self Care, 16109- Manual therapy, [phone]- Gait training, [phone]- Electrical stimulation (unattended), 97016- Vasopneumatic device, 97035- Ultrasound, 91478- Ionotophoresis 4mg /ml Dexamethasone , Patient/Family education, Balance training, Stair training, Taping, Dry Needling, Cryotherapy, and Moist heat  PLAN FOR NEXT SESSION: Complete 6 minute walk test, Berg & FGA; review initial HEP; progress LE strengthening; initiate balance training   Francisco Irving, PT 12/07/2023, 3:49 PM   Date of referral: 10/27/23 Referring provider: Dorrene Gaucher, NP Referring diagnosis? R53.83 (ICD-10-CM) - Fatigue, unspecified type  Treatment diagnosis? (if different than referring diagnosis)  Muscle weakness (generalized)  Unsteadiness on feet  History of falling  Difficulty in walking, not elsewhere classified  What was this (referring dx) caused by? Fall, Ongoing Issue, and Unspecified  Nature of Condition: Chronic (continuous duration > 3 months)   Laterality: Both  Current Functional Measure Score: Other ABC scale 1310 / 1600  = 81.9 %   Objective measurements identify impairments when they are compared to normal values, the uninvolved extremity, and prior level of function.  []  Yes  []  No  Objective assessment of functional ability: Moderate functional limitations   Briefly describe symptoms: Patient presents with physical impairments of impaired activity tolerance, impaired standing balance, impaired ambulation, and decreased safety awareness impacting safe and independent functional mobility.  Examination revealed increased risk for falls and functional decline as evidenced by the following objective test measures: 5xSTS of 17.03 sec (>15 sec indicates increased risk for falls and decreased BLE power). Further assessment of Berg, FGA and 6 minute walk test to be completed next visit.  How did symptoms start: gradual onset  Average pain intensity:  Last 24 hours: 0/10  Past week: 4-5/10  How often does the pt experience symptoms? Occasionally  How much have the symptoms interfered with usual daily activities? A little bit  How has condition changed since care began at this facility? NA - initial visit  In general, how is the patients overall health? Very Good  Onset date: ~1 year   BACK PAIN (STarT Back Screening Tool) - (When applicable): N/A  Has your back pain spread down your leg(s) at sometime in the last 2 weeks? []  Yes   []  No Have you had pain in the shoulder or neck at sometime in the past 2 weeks? []  Yes   []  No Have you only walked short distances because of your back pain? []  Yes   []  No In the past 2 weeks, have you dressed more slowly than usual because of your back pain? []  Yes   []  No Do you think it is not really safe for person with a condition like yours to be physically active? []  Yes   []  No Have worrying thoughts been going through your mind a lot of the time? []  Yes   []  No Do you feel that your back pain is terrible and it is never going to  get any better? []  Yes   []   No In general, have you stopped enjoying all the things you usually enjoy? []  Yes   []  No Overall, how bothersome has your back pain been in the last 2 weeks? []  Not at all   []  Slightly     []  Moderate   []  Very much     []  Extremely

## 2023-12-07 ENCOUNTER — Encounter: Payer: Self-pay | Admitting: Physical Therapy

## 2023-12-07 ENCOUNTER — Ambulatory Visit: Attending: Family | Admitting: Physical Therapy

## 2023-12-07 ENCOUNTER — Other Ambulatory Visit: Payer: Self-pay

## 2023-12-07 DIAGNOSIS — R262 Difficulty in walking, not elsewhere classified: Secondary | ICD-10-CM | POA: Diagnosis present

## 2023-12-07 DIAGNOSIS — R2681 Unsteadiness on feet: Secondary | ICD-10-CM | POA: Diagnosis present

## 2023-12-07 DIAGNOSIS — Z9181 History of falling: Secondary | ICD-10-CM | POA: Insufficient documentation

## 2023-12-07 DIAGNOSIS — M6281 Muscle weakness (generalized): Secondary | ICD-10-CM | POA: Insufficient documentation

## 2023-12-07 DIAGNOSIS — R5383 Other fatigue: Secondary | ICD-10-CM | POA: Diagnosis not present

## 2023-12-10 ENCOUNTER — Encounter: Payer: Self-pay | Admitting: Physical Therapy

## 2023-12-10 ENCOUNTER — Ambulatory Visit: Admitting: Physical Therapy

## 2023-12-10 DIAGNOSIS — R262 Difficulty in walking, not elsewhere classified: Secondary | ICD-10-CM

## 2023-12-10 DIAGNOSIS — R2681 Unsteadiness on feet: Secondary | ICD-10-CM

## 2023-12-10 DIAGNOSIS — M6281 Muscle weakness (generalized): Secondary | ICD-10-CM

## 2023-12-10 DIAGNOSIS — Z9181 History of falling: Secondary | ICD-10-CM

## 2023-12-10 NOTE — Therapy (Addendum)
 OUTPATIENT PHYSICAL THERAPY TREATMENT   Patient Name: Angelica Davis MRN: [id number] DOB:Aug 29, 1940, 83 y.o., female Today's Date: 12/10/2023   END OF SESSION:  PT End of Session - 12/10/23 0935     Visit Number 2    Date for PT Re-Evaluation 02/01/24    Authorization Type [employer]    Authorization Time Period 12/07/23 - 02/01/24    Authorization - Visit Number 2    Authorization - Number of Visits 8    Progress Note Due on Visit 10    PT Start Time 0935    PT Stop Time 1017    PT Time Calculation (min) 42 min    Activity Tolerance Patient tolerated treatment well;Patient limited by fatigue    Behavior During Therapy [facility] for tasks assessed/performed              Past Medical History:  Diagnosis Date   Arthritis    Cataract    bilateral, has had removed   Colitis    Diverticulosis    History of blood transfusion    after birth of child   Hx of colonic polyps - adenomas 08/16/2002   Hyperlipidemia    Skin cancer    skin- On bridge of nose, basal cell carcinoma per patient   Past Surgical History:  Procedure Laterality Date   CATARACT EXTRACTION Bilateral 01/13/2019   COLONOSCOPY  last 05/12/2014   polyps removed 2015, 2020 TA x1   KNEE ARTHROSCOPY Left 06/2007   Dr. Jinger Mount   MOHS SURGERY     bridge of nose x 2 (unsure of dates ?2021 and 2018)   PARTIAL KNEE ARTHROPLASTY Right 08/27/2021   Procedure: UNICOMPARTMENTAL KNEE;  Surgeon: Osa Blase, MD;  Location: [facility];  Service: Orthopedics;  Laterality: Right;   TONSILLECTOMY     TOTAL KNEE ARTHROPLASTY Left 12/2008   Dr. Jinger Mount   Patient Active Problem List   Diagnosis Date Noted   Dyspnea on exertion 10/27/2023   Fatigue 10/27/2023   Preventative health care 10/27/2023   Skin cancer 09/17/2022   Urinary incontinence 09/17/2022   Cough 09/17/2022   S/P right unicompartmental knee replacement 08/27/2021   Osteoarthritis of right knee 08/06/2021   Pain of left hand 11/16/2019   Trigger finger of  left hand 11/16/2019   Hyperlipidemia 09/04/2015   Hx of colonic polyps - adenomas 08/16/2002    PCP: Dorrene Gaucher, NP   REFERRING PROVIDER: Dorrene Gaucher, NP   REFERRING DIAG: R53.83 (ICD-10-CM) - Fatigue, unspecified type   C/o LE weakness, please evaluate for strength/balance.   THERAPY DIAG:  Muscle weakness (generalized)  Unsteadiness on feet  History of falling  Difficulty in walking, not elsewhere classified  RATIONALE FOR EVALUATION AND TREATMENT: Rehabilitation  ONSET DATE: up to 1 yr  NEXT MD VISIT: 11/15/2024   SUBJECTIVE:  SUBJECTIVE STATEMENT: Pt reports she has not had any knee pain this week but it still feels tight sometimes. She has not had a chance to try the HEP yet.  Pt reports [place] - every time she has to take the trash out or go for the mail she finds herself feeling weak and out of breath.  She volunteers at [employer] where she slid/fell out of a chair to the floor ~1 month ago and could not get up.  PAIN: Are you having pain? Yes: NPRS scale: 0/10 currently, up to 4-5/10  Pain location: L lateral knee  Pain description: occasional throbbing  Aggravating factors: unpredictable - sometimes sitting or prolonged standing but not consistent  Relieving factors: resolves on its own after 10-15 minutes   PERTINENT HISTORY:  Arthritis, R unicompartmental knee replacement 08/2021, L TKA 12/2008, colitis, diverticulosis, HLD, skin cancer, DOE, fatigue, urinary incontinence  PRECAUTIONS: Fall  RED FLAGS: None  WEIGHT BEARING RESTRICTIONS: No  FALLS:  Has patient fallen in last 6 months? Yes. Number of falls 1 - fall out of a chair/stool   LIVING ENVIRONMENT: Lives with: lives alone Lives in: House/apartment Stairs: Yes:  External: 5-6 steps; on right going up, on left going up, and can reach both Has following equipment at home: Single point cane, Walker - 2 wheeled, and Ramped entry  OCCUPATION: Retired  PLOF: Independent and Leisure: no regular exercise, Comptroller at [employer]   PATIENT GOALS: To be able t walk/move around confidently.   OBJECTIVE: (objective measures completed at initial evaluation unless otherwise dated)  DIAGNOSTIC FINDINGS:  N/A  COGNITION: Overall cognitive status: Within functional limits for tasks assessed   SENSATION: WFL  COORDINATION: LE gross motor WFL  POSTURE:  rounded shoulders, forward head, and flexed trunk   MUSCLE LENGTH: Hamstrings: mild/mod tight B ITB: mild/mod tight B Piriformis: mod tight B Hip flexors: mod tight B Quads: mild/mod tight B Heelcord: mild/mod tight B  LOWER EXTREMITY ROM:    B knee grossly 0-115  LOWER EXTREMITY MMT:    MMT Right eval Left eval  Hip flexion 4- 4-  Hip extension 4- 4-  Hip abduction 4- 4-  Hip adduction 4- 4-  Hip internal rotation 4- 4-  Hip external rotation 4- 4  Knee flexion 3+ 3+  Knee extension 4+ 4+  Ankle dorsiflexion 4 4  Ankle plantarflexion    Ankle inversion    Ankle eversion    (Blank rows = not tested)  BED MOBILITY:  Independent  TRANSFERS: Assistive device utilized: None  Sit to stand: Complete Independence Stand to sit: Complete Independence Chair to chair: Complete Independence Floor: NT  GAIT: Distance walked: Clinic distances Assistive device utilized: None Level of assistance: Complete Independence Gait pattern: WFL   FUNCTIONAL TESTS:  5 times sit to stand: 17.03 sec Timed up and go (TUG): 9.28 sec 6 minute walk test: see below (12/10/23) 10 meter walk test: 8.93 sec; Gait speed = 3.67 ft/sec Berg Balance Scale:  52/56 (12/10/23); 52-55/56 indicates lower (> 25%) risk for falls Functional gait assessment: 20/30 (12/10/23); 19-24/30 indicates medium risk for  falls  12/10/23: 6 Minute Walk- Baseline  BP (mmHg) 162/80   HR (bpm) 77   02 Sat (%RA) 96 %   Modified Borg Scale for Dyspnea 0- Nothing at all   Perceived Rate of Exertion (Borg) 6-   6 Minute walk- Post Test  BP (mmHg) 168/78   HR (bpm) 97   02 Sat (%RA)  97 %   Modified Borg Scale for Dyspnea 3- Moderate shortness of breath or breathing difficulty   Perceived Rate of Exertion (Borg) 12-   6 minute walk test results   Aerobic Endurance Distance Walked 1144', Normal range for females 80-90: 730' - 1840'  Standardized Balance Assessment  Standardized Balance Assessment Berg Balance Test   Berg Balance Test  Sit to Stand Able to stand without using hands and stabilize independently   Standing Unsupported Able to stand safely 2 minutes   Sitting with Back Unsupported but Feet Supported on Floor or Stool Able to sit safely and securely 2 minutes   Stand to Sit Sits safely with minimal use of hands   Transfers Able to transfer safely, minor use of hands   Standing Unsupported with Eyes Closed Able to stand 10 seconds safely   Standing Unsupported with Feet Together Able to place feet together independently and stand 1 minute safely   From Standing, Reach Forward with Outstretched Arm Can reach forward >12 cm safely (5)   From Standing Position, Pick up Object from Floor Able to pick up shoe safely and easily   From Standing Position, Turn to Look Behind Over each Shoulder Looks behind from both sides and weight shifts well   Turn 360 Degrees Able to turn 360 degrees safely in 4 seconds or less   Standing Unsupported, Alternately Place Feet on Step/Stool Able to stand independently and safely and complete 8 steps in 20 seconds   Standing Unsupported, One Foot in Front Able to plae foot ahead of the other independently and hold 30 seconds   Standing on One Leg Able to lift leg independently and hold equal to or more than 3 seconds   Total Score 52   Berg comment: 52-55 lower (> 25%) risk  for falls   Functional Gait  Assessment  Gait Level Surface Walks 20 ft in less than 7 sec but greater than 5.5 sec, uses assistive device, slower speed, mild gait deviations, or deviates 6-10 in outside of the 12 in walkway width.   Change in Gait Speed Able to smoothly change walking speed without loss of balance or gait deviation. Deviate no more than 6 in outside of the 12 in walkway width.   Gait with Horizontal Head Turns Performs head turns smoothly with slight change in gait velocity (eg, minor disruption to smooth gait path), deviates 6-10 in outside 12 in walkway width, or uses an assistive device.   Gait with Vertical Head Turns Performs task with slight change in gait velocity (eg, minor disruption to smooth gait path), deviates 6 - 10 in outside 12 in walkway width or uses assistive device   Gait and Pivot Turn Pivot turns safely within 3 sec and stops quickly with no loss of balance.   Step Over Obstacle Is able to step over 2 stacked shoe boxes taped together (9 in total height) without changing gait speed. No evidence of imbalance.   Gait with Narrow Base of Support Ambulates 4-7 steps.   Gait with Eyes Closed Walks 20 ft, slow speed, abnormal gait pattern, evidence for imbalance, deviates 10-15 in outside 12 in walkway width. Requires more than 9 sec to ambulate 20 ft.   Ambulating Backwards Walks 20 ft, slow speed, abnormal gait pattern, evidence for imbalance, deviates 10-15 in outside 12 in walkway width.   Steps Alternating feet, must use rail.   Total Score 20   FGA comment: 19-24 = medium risk fall  PATIENT SURVEYS:  ABC scale 1310 / 1600 = 81.9 %; >80% indicates a high level of physical functioning   TODAY'S TREATMENT:   12/10/2023 PHYSICAL PERFORMANCE TEST or MEASUREMENT:  6 Minute Walk- Baseline   BP (mmHg) 162/80    HR (bpm) 77    02 Sat (%RA) 96 %    Modified Borg Scale for Dyspnea 0- Nothing at all    Perceived Rate of Exertion (Borg) 6-      6 Minute  walk- Post Test   BP (mmHg) 168/78    HR (bpm) 97    02 Sat (%RA) 97 %    Modified Borg Scale for Dyspnea 3- Moderate shortness of breath or breathing difficulty    Perceived Rate of Exertion (Borg) 12-      6 minute walk test results    Aerobic Endurance Distance Walked 1144', Normal range for females 80-90: 730' - 1840'     Standardized Balance Assessment   Standardized Balance Assessment Berg Balance Test      Berg Balance Test   Sit to Stand Able to stand without using hands and stabilize independently    Standing Unsupported Able to stand safely 2 minutes    Sitting with Back Unsupported but Feet Supported on Floor or Stool Able to sit safely and securely 2 minutes    Stand to Sit Sits safely with minimal use of hands    Transfers Able to transfer safely, minor use of hands    Standing Unsupported with Eyes Closed Able to stand 10 seconds safely    Standing Unsupported with Feet Together Able to place feet together independently and stand 1 minute safely    From Standing, Reach Forward with Outstretched Arm Can reach forward >12 cm safely (5)    From Standing Position, Pick up Object from Floor Able to pick up shoe safely and easily    From Standing Position, Turn to Look Behind Over each Shoulder Looks behind from both sides and weight shifts well    Turn 360 Degrees Able to turn 360 degrees safely in 4 seconds or less    Standing Unsupported, Alternately Place Feet on Step/Stool Able to stand independently and safely and complete 8 steps in 20 seconds    Standing Unsupported, One Foot in Front Able to plae foot ahead of the other independently and hold 30 seconds    Standing on One Leg Able to lift leg independently and hold equal to or more than 3 seconds    Total Score 52    Berg comment: 52-55 lower (> 25%) risk for falls      Functional Gait  Assessment   Gait Level Surface Walks 20 ft in less than 7 sec but greater than 5.5 sec, uses assistive device, slower speed, mild  gait deviations, or deviates 6-10 in outside of the 12 in walkway width.    Change in Gait Speed Able to smoothly change walking speed without loss of balance or gait deviation. Deviate no more than 6 in outside of the 12 in walkway width.    Gait with Horizontal Head Turns Performs head turns smoothly with slight change in gait velocity (eg, minor disruption to smooth gait path), deviates 6-10 in outside 12 in walkway width, or uses an assistive device.    Gait with Vertical Head Turns Performs task with slight change in gait velocity (eg, minor disruption to smooth gait path), deviates 6 - 10 in outside 12 in walkway width or uses assistive device  Gait and Pivot Turn Pivot turns safely within 3 sec and stops quickly with no loss of balance.    Step Over Obstacle Is able to step over 2 stacked shoe boxes taped together (9 in total height) without changing gait speed. No evidence of imbalance.    Gait with Narrow Base of Support Ambulates 4-7 steps.    Gait with Eyes Closed Walks 20 ft, slow speed, abnormal gait pattern, evidence for imbalance, deviates 10-15 in outside 12 in walkway width. Requires more than 9 sec to ambulate 20 ft.    Ambulating Backwards Walks 20 ft, slow speed, abnormal gait pattern, evidence for imbalance, deviates 10-15 in outside 12 in walkway width.    Steps Alternating feet, must use rail.    Total Score 20    FGA comment: 19-24 = medium risk fall      12/07/2023  SELF CARE:  Reviewed eval findings and role of PT in addressing identified deficits as well as instruction in initial HEP (see below).    PATIENT EDUCATION:  Education details: PT eval findings and anticipated POC  Person educated: Patient Education method: Explanation Education comprehension: verbalized understanding  HOME EXERCISE PROGRAM: Access Code: [email or web address] URL: [email or web address] Date: 12/07/2023 Prepared by: Felecia Hopper  Exercises - Clam with Resistance  - 1 x daily - 5-7 x  weekly - 2 sets - 10 reps - 3-5 sec hold - Standing Hip Abduction with Resistance at Thighs  - 1 x daily - 5-7 x weekly - 2 sets - 10 reps - 3 sec hold - Standing Hip Extension with Resistance at Ankles and Counter Support  - 1 x daily - 5-7 x weekly - 2 sets - 10 reps - 3 sec hold - Marching with Resistance  - 1 x daily - 5-7 x weekly - 2 sets - 10 reps - 3 sec hold - Seated Hamstring Curls with Resistance  - 1 x daily - 5-7 x weekly - 2 sets - 10 reps - 3 sec hold   ASSESSMENT:  CLINICAL IMPRESSION: Completed remaining initial assessments of 6-minute walk test, Berg and FGA today.  Gait distance on 6-minute walk test within normal range for females 28 to 81 years old with patient experiencing moderate perceived DOE and perceived rate of exertion.  Berg score of 52/56 indicating lower risk for falls (>25%) and FGA score of 20/30 indicating medium risk for falls.  Angelica Davis reports she has not yet been able to attempt her initial HEP as she was out of town, therefore deferred review until next visit after she has had a chance to try them out at home.  Angelica Davis will benefit from continued skilled PT to address identified strength and balance deficits to improve mobility and activity tolerance with decreased pain interference and decreased risk for falls.   From EVAL: Angelica Davis is a 83 y.o. female who was referred to physical therapy for evaluation and treatment for fatigue with associated LE weakness.  Patient presents with physical impairments of impaired activity tolerance, impaired standing balance, impaired ambulation, and decreased safety awareness impacting safe and independent functional mobility.  Examination revealed gait speed of 3.67 ft/sec, (2.62 ft/sec is needed for community access) and TUG of 9.28 sec (>13.5 sec indicates increased risk for falls) WNL however increased risk for falls and functional decline as evidenced by the following objective test measures:  5xSTS of 17.03 sec (>15 sec  indicates increased risk for falls and decreased BLE power). Further assessment of Berg, FGA and 6  minute walk test to be completed next visit.  Angelica Davis will benefit from skilled PT to address above deficits to improve mobility and activity tolerance to help reach the maximal level of functional independence and mobility. Patient demonstrates understanding of this POC and is in agreement with this plan.   OBJECTIVE IMPAIRMENTS: Abnormal gait, decreased activity tolerance, decreased balance, decreased mobility, difficulty walking, decreased strength, decreased safety awareness, increased fascial restrictions, impaired perceived functional ability, increased muscle spasms, improper body mechanics, postural dysfunction, and pain.   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   REHAB POTENTIAL: Good  CLINICAL DECISION MAKING: Evolving/moderate complexity  EVALUATION COMPLEXITY: Moderate   GOALS: Goals reviewed with patient? Yes  SHORT TERM GOALS: Target date: 01/04/2024  Patient will be independent with initial HEP to improve outcomes and carryover.  Baseline:  Goal status: IN PROGRESS - 12/10/23 - Pt has not yet attempted initial HEP  2.  Patient will be educated on strategies to decrease risk of falls.  Baseline:  Goal status: IN PROGRESS  3.  Patient will improve 5xSTS time to </= 15 seconds for improved efficiency and safety with transfers. Baseline: 17.03 sec Goal status: IN PROGRESS  LONG TERM GOALS: Target  date: 02/01/2024  Patient will be independent with advanced/ongoing HEP to facilitate ability to maintain/progress functional gains from skilled physical therapy services. Baseline:  Goal status: IN PROGRESS  2.  Patient will be able to ambulate 600' with or w/o LRAD on variable surfaces including curb and stair negotiation with single rail with good safety to access community.  Baseline:  Goal status: IN PROGRESS  3. Patient will demonstrate improved B LE strength to >/= 4 to 4+/5 for improved stability and ease of mobility . Baseline: Refer to above LE MMT table Goal status: IN PROGRESS  4.  Patient will improve Berg score by at least 8 points or to >/= 52/56 to improve safety and stability with ADLs in standing and reduce risk for falls. (MCID= 8 points)  Baseline: 52/26 (12/10/23) Goal status: MET  5. Patient will improve FGA score by at least 4 points or to at least 21/30 to improve gait stability and reduce risk for falls. Baseline: 20/30 (12/10/23) Goal status: IN PROGRESS   PLAN:  PT FREQUENCY: 2x/week  PT DURATION: 8 weeks  PLANNED INTERVENTIONS: 16109- PT Re-evaluation, 97750- Physical Performance Testing, 97110-Therapeutic exercises, 97530- Therapeutic activity, [id number]- Neuromuscular re-education, 97535- Self Care, 60454- Manual therapy, [phone]- Gait training, [phone]- Electrical stimulation (unattended), 97016- Vasopneumatic device, 97035- Ultrasound, 29562- Ionotophoresis 4mg /ml Dexamethasone , Patient/Family education, Balance training, Stair training, Taping, Dry Needling, Cryotherapy, and Moist heat  PLAN FOR NEXT SESSION: provide education on fall risk reduction; review initial HEP; progress LE strengthening; initiate balance training   Francisco Irving, PT 12/10/2023, 10:36 AM   Date of referral: 10/27/23 Referring provider: Dorrene Gaucher, NP Referring diagnosis? R53.83 (ICD-10-CM) - Fatigue, unspecified type  Treatment diagnosis? (if different than referring  diagnosis)  Muscle weakness (generalized)  Unsteadiness on feet  History of falling  Difficulty in walking, not elsewhere classified  What was this (referring dx) caused by? Fall, Ongoing Issue, and Unspecified  Lonne Roan of Condition: Chronic (continuous duration > 3 months)  Laterality: Both  Current Functional Measure Score: Other ABC scale 1310 / 1600 = 81.9 %   Objective measurements identify impairments when they are compared to normal values, the uninvolved extremity, and prior level of function.  []  Yes  []  No  Objective assessment of functional ability: Moderate functional limitations   Briefly describe symptoms: Patient presents with physical impairments of impaired activity tolerance, impaired standing balance, impaired ambulation, and decreased safety awareness impacting safe and independent functional mobility.  Examination revealed increased risk for falls and functional decline as evidenced by the following objective test measures: 5xSTS of 17.03 sec (>15 sec indicates increased risk for falls and decreased BLE power). Further assessment of Berg, FGA and 6 minute walk test to be completed next visit.  How did symptoms start: gradual onset  Average pain intensity:  Last 24 hours: 0/10  Past week: 4-5/10  How often does the pt experience symptoms? Occasionally  How much have the symptoms interfered with usual daily activities? A little bit  How has condition changed since care began at this facility? NA - initial visit  In general, how is the patients overall health? Very Good  Onset date: ~1 year   BACK PAIN (STarT Back Screening Tool) - (When applicable): N/A  Has your back pain spread down your leg(s) at sometime in the last 2 weeks? []  Yes   []  No Have you had pain in the shoulder or neck at sometime in the past 2 weeks? []  Yes   []  No Have you only walked short distances because of your back pain? []  Yes   []  No In the past 2 weeks, have you dressed  more slowly than usual because of your back pain? []  Yes   []  No Do you think it is not really safe for person with a condition like yours to be physically active? []  Yes   []  No Have worrying thoughts been going through your mind a lot of the time? []  Yes   []  No Do you feel that your back pain is terrible and it is never going to get any better? []  Yes   []  No In general, have you stopped enjoying all the things you usually enjoy? []  Yes   []  No Overall, how bothersome has your back pain been in the last 2 weeks? []  Not at all   []  Slightly     []  Moderate   []  Very much     []  Extremely

## 2023-12-15 ENCOUNTER — Ambulatory Visit

## 2023-12-16 ENCOUNTER — Encounter: Payer: Self-pay | Admitting: Physical Therapy

## 2023-12-16 ENCOUNTER — Ambulatory Visit: Admitting: Physical Therapy

## 2023-12-16 DIAGNOSIS — M6281 Muscle weakness (generalized): Secondary | ICD-10-CM

## 2023-12-16 DIAGNOSIS — R2681 Unsteadiness on feet: Secondary | ICD-10-CM

## 2023-12-16 DIAGNOSIS — R262 Difficulty in walking, not elsewhere classified: Secondary | ICD-10-CM

## 2023-12-16 DIAGNOSIS — Z9181 History of falling: Secondary | ICD-10-CM

## 2023-12-16 NOTE — Therapy (Signed)
 OUTPATIENT PHYSICAL THERAPY TREATMENT   Patient Name: Angelica Davis MRN: [id number] DOB:May 17, 1941, 83 y.o., female Today's Date: 12/16/2023   END OF SESSION:  PT End of Session - 12/16/23 0852     Visit Number 3    Date for PT Re-Evaluation 02/01/24    Authorization Type [employer]    Authorization Time Period 12/07/23 - 02/01/24    Authorization - Visit Number 3    Authorization - Number of Visits 8    Progress Note Due on Visit 10    PT Start Time 0850    PT Stop Time 0934    PT Time Calculation (min) 44 min    Activity Tolerance Patient tolerated treatment well;Patient limited by fatigue    Behavior During Therapy [facility] for tasks assessed/performed               Past Medical History:  Diagnosis Date   Arthritis    Cataract    bilateral, has had removed   Colitis    Diverticulosis    History of blood transfusion    after birth of child   Hx of colonic polyps - adenomas 08/16/2002   Hyperlipidemia    Skin cancer    skin- On bridge of nose, basal cell carcinoma per patient   Past Surgical History:  Procedure Laterality Date   CATARACT EXTRACTION Bilateral 01/13/2019   COLONOSCOPY  last 05/12/2014   polyps removed 2015, 2020 TA x1   KNEE ARTHROSCOPY Left 06/2007   Dr. Jinger Mount   MOHS SURGERY     bridge of nose x 2 (unsure of dates ?2021 and 2018)   PARTIAL KNEE ARTHROPLASTY Right 08/27/2021   Procedure: UNICOMPARTMENTAL KNEE;  Surgeon: Osa Blase, MD;  Location: [facility];  Service: Orthopedics;  Laterality: Right;   TONSILLECTOMY     TOTAL KNEE ARTHROPLASTY Left 12/2008   Dr. Jinger Mount   Patient Active Problem List   Diagnosis Date Noted   Dyspnea on exertion 10/27/2023   Fatigue 10/27/2023   Preventative health care 10/27/2023   Skin cancer 09/17/2022   Urinary incontinence 09/17/2022   Cough 09/17/2022   S/P right unicompartmental knee replacement 08/27/2021   Osteoarthritis of right knee 08/06/2021   Pain of left hand 11/16/2019   Trigger finger of  left hand 11/16/2019   Hyperlipidemia 09/04/2015   Hx of colonic polyps - adenomas 08/16/2002    PCP: Dorrene Gaucher, NP   REFERRING PROVIDER: Dorrene Gaucher, NP   REFERRING DIAG: R53.83 (ICD-10-CM) - Fatigue, unspecified type   C/o LE weakness, please evaluate for strength/balance.   THERAPY DIAG:  Muscle weakness (generalized)  Unsteadiness on feet  History of falling  Difficulty in walking, not elsewhere classified  RATIONALE FOR EVALUATION AND TREATMENT: Rehabilitation  ONSET DATE: up to 1 yr  NEXT MD VISIT: 11/15/2024   SUBJECTIVE:  SUBJECTIVE STATEMENT: Mild L knee pain today.  Able to try the initial HEP with no concerns, but states her L knee feels it - no pain during the exercises but notes some discomfort when she goes to get up later.  Pt reports her [place] - every time she has to take the trash out or go for the mail she finds herself feeling weak and out of breath.  She volunteers at [employer] where she slid/fell out of a chair to the floor ~1 month ago and could not get up.  PAIN: Are you having pain? Yes: NPRS scale: 3/10 currently, up to 4-5/10  Pain location: L lateral knee  Pain description: occasional throbbing  Aggravating factors: unpredictable - sometimes sitting or prolonged standing but not consistent  Relieving factors: resolves on its own after 10-15 minutes   PERTINENT HISTORY:  Arthritis, R unicompartmental knee replacement 08/2021, L TKA 12/2008, colitis, diverticulosis, HLD, skin cancer, DOE, fatigue, urinary incontinence  PRECAUTIONS: Fall  RED FLAGS: None  WEIGHT BEARING RESTRICTIONS: No  FALLS:  Has patient fallen in last 6 months? Yes. Number of falls 1 - fall out of a chair/stool   LIVING ENVIRONMENT: Lives  with: lives alone Lives in: House/apartment Stairs: Yes: External: 5-6 steps; on right going up, on left going up, and can reach both Has following equipment at home: Single point cane, Walker - 2 wheeled, and Ramped entry  OCCUPATION: Retired  PLOF: Independent and Leisure: no regular exercise, Comptroller at [employer]   PATIENT GOALS: To be able t walk/move around confidently.   OBJECTIVE: (objective measures completed at initial evaluation unless otherwise dated)  DIAGNOSTIC FINDINGS:  N/A  COGNITION: Overall cognitive status: Within functional limits for tasks assessed   SENSATION: WFL  COORDINATION: LE gross motor WFL  POSTURE:  rounded shoulders, forward head, and flexed trunk   MUSCLE LENGTH: Hamstrings: mild/mod tight B ITB: mild/mod tight B Piriformis: mod tight B Hip flexors: mod tight B Quads: mild/mod tight B Heelcord: mild/mod tight B  LOWER EXTREMITY ROM:    B knee grossly 0-115  LOWER EXTREMITY MMT:    MMT Right eval Left eval  Hip flexion 4- 4-  Hip extension 4- 4-  Hip abduction 4- 4-  Hip adduction 4- 4-  Hip internal rotation 4- 4-  Hip external rotation 4- 4  Knee flexion 3+ 3+  Knee extension 4+ 4+  Ankle dorsiflexion 4 4  Ankle plantarflexion    Ankle inversion    Ankle eversion    (Blank rows = not tested)  BED MOBILITY:  Independent  TRANSFERS: Assistive device utilized: None  Sit to stand: Complete Independence Stand to sit: Complete Independence Chair to chair: Complete Independence Floor: NT  GAIT: Distance walked: Clinic distances Assistive device utilized: None Level of assistance: Complete Independence Gait pattern: WFL   FUNCTIONAL TESTS:  5 times sit to stand: 17.03 sec Timed up and go (TUG): 9.28 sec 6 minute walk test: see below (12/10/23) 10 meter walk test: 8.93 sec; Gait speed = 3.67 ft/sec Berg Balance Scale:  52/56 (12/10/23); 52-55/56 indicates lower (> 25%) risk for falls Functional gait assessment:  20/30 (12/10/23); 19-24/30 indicates medium risk for falls  12/10/23: 6 Minute Walk- Baseline  BP (mmHg) 162/80   HR (bpm) 77   02 Sat (%RA) 96 %   Modified Borg Scale for Dyspnea 0- Nothing at all   Perceived Rate of Exertion (Borg) 6-   6 Minute walk- Post Test  BP (mmHg)  168/78   HR (bpm) 97   02 Sat (%RA) 97 %   Modified Borg Scale for Dyspnea 3- Moderate shortness of breath or breathing difficulty   Perceived Rate of Exertion (Borg) 12-   6 minute walk test results   Aerobic Endurance Distance Walked 1144', Normal range for females 80-90: 730' - 1840'  Standardized Balance Assessment  Standardized Balance Assessment Berg Balance Test   Berg Balance Test  Sit to Stand Able to stand without using hands and stabilize independently   Standing Unsupported Able to stand safely 2 minutes   Sitting with Back Unsupported but Feet Supported on Floor or Stool Able to sit safely and securely 2 minutes   Stand to Sit Sits safely with minimal use of hands   Transfers Able to transfer safely, minor use of hands   Standing Unsupported with Eyes Closed Able to stand 10 seconds safely   Standing Unsupported with Feet Together Able to place feet together independently and stand 1 minute safely   From Standing, Reach Forward with Outstretched Arm Can reach forward >12 cm safely (5)   From Standing Position, Pick up Object from Floor Able to pick up shoe safely and easily   From Standing Position, Turn to Look Behind Over each Shoulder Looks behind from both sides and weight shifts well   Turn 360 Degrees Able to turn 360 degrees safely in 4 seconds or less   Standing Unsupported, Alternately Place Feet on Step/Stool Able to stand independently and safely and complete 8 steps in 20 seconds   Standing Unsupported, One Foot in Front Able to plae foot ahead of the other independently and hold 30 seconds   Standing on One Leg Able to lift leg independently and hold equal to or more than 3 seconds   Total  Score 52   Berg comment: 52-55 lower (> 25%) risk for falls   Functional Gait  Assessment  Gait Level Surface Walks 20 ft in less than 7 sec but greater than 5.5 sec, uses assistive device, slower speed, mild gait deviations, or deviates 6-10 in outside of the 12 in walkway width.   Change in Gait Speed Able to smoothly change walking speed without loss of balance or gait deviation. Deviate no more than 6 in outside of the 12 in walkway width.   Gait with Horizontal Head Turns Performs head turns smoothly with slight change in gait velocity (eg, minor disruption to smooth gait path), deviates 6-10 in outside 12 in walkway width, or uses an assistive device.   Gait with Vertical Head Turns Performs task with slight change in gait velocity (eg, minor disruption to smooth gait path), deviates 6 - 10 in outside 12 in walkway width or uses assistive device   Gait and Pivot Turn Pivot turns safely within 3 sec and stops quickly with no loss of balance.   Step Over Obstacle Is able to step over 2 stacked shoe boxes taped together (9 in total height) without changing gait speed. No evidence of imbalance.   Gait with Narrow Base of Support Ambulates 4-7 steps.   Gait with Eyes Closed Walks 20 ft, slow speed, abnormal gait pattern, evidence for imbalance, deviates 10-15 in outside 12 in walkway width. Requires more than 9 sec to ambulate 20 ft.   Ambulating Backwards Walks 20 ft, slow speed, abnormal gait pattern, evidence for imbalance, deviates 10-15 in outside 12 in walkway width.   Steps Alternating feet, must use rail.   Total Score 20  FGA comment: 19-24 = medium risk fall     PATIENT SURVEYS:  ABC scale 1310 / 1600 = 81.9 %; >80% indicates a high level of physical functioning   TODAY'S TREATMENT:   12/16/2023 THERAPEUTIC EXERCISE: To improve strength, endurance, and flexibility.  Demonstration, verbal and tactile cues throughout for technique.  Rec Bike - L1 x 6 min Seated hamstring curls with  looped RTB at ankles 2 x 10 bil S/L RTB clam 2 x 10 Hooklying L HS stretch with strap x 30 Supine L HS stretch with strap x 30 Supine L ITB stretch with strap x 30 Hooklying L KTOS piriformis with strap for added reach x 30 Mod thomas L quad/hip flexor stretch with strap x 30  NEUROMUSCULAR RE-EDUCATION: To improve balance, coordination, kinesthesia, posture, and proprioception.  Standing alt hip abduction with looped RTB at thighs x 10 bil Standing alt hip abduction with looped RTB at ankles x 10 bil Standing alt hip extension with looped RTB at ankles x 10 bil Standing alt hip flexion march with looped RTB at midfeet x 10 bil   12/10/2023 PHYSICAL PERFORMANCE TEST or MEASUREMENT:  6 Minute Walk- Baseline   BP (mmHg) 162/80    HR (bpm) 77    02 Sat (%RA) 96 %    Modified Borg Scale for Dyspnea 0- Nothing at all    Perceived Rate of Exertion (Borg) 6-      6 Minute walk- Post Test   BP (mmHg) 168/78    HR (bpm) 97    02 Sat (%RA) 97 %    Modified Borg Scale for Dyspnea 3- Moderate shortness of breath or breathing difficulty    Perceived Rate of Exertion (Borg) 12-      6 minute walk test results    Aerobic Endurance Distance Walked 1144', Normal range for females 80-90: 730' - 1840'     Standardized Balance Assessment   Standardized Balance Assessment Berg Balance Test      Berg Balance Test   Sit to Stand Able to stand without using hands and stabilize independently    Standing Unsupported Able to stand safely 2 minutes    Sitting with Back Unsupported but Feet Supported on Floor or Stool Able to sit safely and securely 2 minutes    Stand to Sit Sits safely with minimal use of hands    Transfers Able to transfer safely, minor use of hands    Standing Unsupported with Eyes Closed Able to stand 10 seconds safely    Standing Unsupported with Feet Together Able to place feet together independently and stand 1 minute safely    From Standing, Reach Forward with Outstretched  Arm Can reach forward >12 cm safely (5)    From Standing Position, Pick up Object from Floor Able to pick up shoe safely and easily    From Standing Position, Turn to Look Behind Over each Shoulder Looks behind from both sides and weight shifts well    Turn 360 Degrees Able to turn 360 degrees safely in 4 seconds or less    Standing Unsupported, Alternately Place Feet on Step/Stool Able to stand independently and safely and complete 8 steps in 20 seconds    Standing Unsupported, One Foot in Front Able to plae foot ahead of the other independently and hold 30 seconds    Standing on One Leg Able to lift leg independently and hold equal to or more than 3 seconds    Total Score 52    Berg  comment: 52-55 lower (> 25%) risk for falls      Functional Gait  Assessment   Gait Level Surface Walks 20 ft in less than 7 sec but greater than 5.5 sec, uses assistive device, slower speed, mild gait deviations, or deviates 6-10 in outside of the 12 in walkway width.    Change in Gait Speed Able to smoothly change walking speed without loss of balance or gait deviation. Deviate no more than 6 in outside of the 12 in walkway width.    Gait with Horizontal Head Turns Performs head turns smoothly with slight change in gait velocity (eg, minor disruption to smooth gait path), deviates 6-10 in outside 12 in walkway width, or uses an assistive device.    Gait with Vertical Head Turns Performs task with slight change in gait velocity (eg, minor disruption to smooth gait path), deviates 6 - 10 in outside 12 in walkway width or uses assistive device    Gait and Pivot Turn Pivot turns safely within 3 sec and stops quickly with no loss of balance.    Step Over Obstacle Is able to step over 2 stacked shoe boxes taped together (9 in total height) without changing gait speed. No evidence of imbalance.    Gait with Narrow Base of Support Ambulates 4-7 steps.    Gait with Eyes Closed Walks 20 ft, slow speed, abnormal gait pattern,  evidence for imbalance, deviates 10-15 in outside 12 in walkway width. Requires more than 9 sec to ambulate 20 ft.    Ambulating Backwards Walks 20 ft, slow speed, abnormal gait pattern, evidence for imbalance, deviates 10-15 in outside 12 in walkway width.    Steps Alternating feet, must use rail.    Total Score 20    FGA comment: 19-24 = medium risk fall      12/07/2023  SELF CARE:  Reviewed eval findings and role of PT in addressing identified deficits as well as instruction in initial HEP (see below).    PATIENT EDUCATION:  Education details: HEP review and HEP update - LE stretching  Person educated: Patient Education method: Explanation, Demonstration, Tactile cues, Verbal cues, and Handouts Education comprehension: verbalized understanding, returned demonstration, verbal cues required, tactile cues required, and needs further education  HOME EXERCISE PROGRAM: Access Code: [email or web address] URL: [email or web address] Date: 12/16/2023 Prepared by: Felecia Hopper  Exercises - Supine Hamstring Stretch with Strap  - 1-2 x daily - 7 x weekly - 3 reps - 30 sec hold - Supine Iliotibial Band Stretch with Strap  - 1-2 x daily - 7 x weekly - 3 reps - 30 sec hold - Supine Piriformis Stretch with Foot on Ground  - 1-2 x daily - 7 x weekly - 3 reps - 30 sec hold - Hip Flexor Stretch with Strap on Table  - 1-2 x daily - 7 x weekly - 3 reps - 30 sec hold - Clam with Resistance  - 1 x daily - 5-7 x weekly - 2 sets - 10 reps - 3-5 sec hold - Standing Hip Abduction with Resistance at Ankles and Counter Support  - 1 x daily - 5-7 x weekly - 2 sets - 10 reps - 3 sec hold - Standing Hip Extension with Resistance at Ankles and Counter Support  - 1 x daily - 5-7 x weekly - 2 sets - 10 reps - 3 sec hold - Marching with Resistance  - 1 x daily - 5-7 x weekly - 2 sets - 10 reps - 3 sec  hold - Seated Hamstring Curls with Resistance  - 1 x daily - 5-7 x weekly - 2 sets - 10 reps - 3 sec  hold   ASSESSMENT:  CLINICAL IMPRESSION: Angelica Davis reports she was able to try the initial HEP at home - no concerns with exercises but did note some increased L knee pain following the HEP performance.  Added stretches to address muscle tightness potentially contributing to L knee pain with patient noting relief/improved flexibility following stretches.  Reviewed standing strengthening exercises modifying placement of resistance to better challenge areas of muscle weakness and for improved tolerance for home performance.  Angelica Davis will benefit from continued skilled PT to address ongoing strength and balance deficits to improve mobility and activity tolerance with decreased pain interference and decreased risk for falls.   From EVAL: Angelica Davis is a 83 y.o. female who was referred to physical therapy for evaluation and treatment for fatigue with associated LE weakness.  Patient presents with physical impairments of impaired activity tolerance, impaired standing balance, impaired ambulation, and decreased safety awareness impacting safe and independent functional mobility.  Examination revealed gait speed of 3.67 ft/sec, (2.62 ft/sec is needed for community access) and TUG of 9.28 sec (>13.5 sec indicates increased risk for falls) WNL however increased risk for falls and functional decline as evidenced by the following objective test measures:  5xSTS of 17.03 sec (>15 sec indicates increased risk for falls and decreased BLE power). Further assessment of Berg, FGA and 6 minute walk test to be completed next visit.  Angelica Davis will benefit from skilled PT to address above deficits to improve mobility and activity tolerance to help reach the maximal level of functional independence and mobility. Patient demonstrates understanding of this POC and is in agreement with this plan.   OBJECTIVE IMPAIRMENTS: Abnormal gait, decreased activity tolerance, decreased balance, decreased mobility, difficulty walking, decreased  strength, decreased safety awareness, increased fascial restrictions, impaired perceived functional ability, increased muscle spasms, improper body mechanics, postural dysfunction, and pain.   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   REHAB POTENTIAL: Good  CLINICAL DECISION MAKING: Evolving/moderate complexity  EVALUATION COMPLEXITY: Moderate   GOALS: Goals reviewed with patient? Yes  SHORT TERM GOALS: Target date: 01/04/2024  Patient will be independent with initial HEP to improve outcomes and carryover.  Baseline: Initial HEP provided on eval 12/10/23 - Pt has not yet attempted initial HEP Goal status: IN PROGRESS - 12/16/23 - HEP reviewed and updated  2.  Patient will be educated on strategies to decrease risk of falls.  Baseline:  Goal status: IN PROGRESS  3.  Patient will improve 5xSTS time to </= 15 seconds for improved efficiency and safety with transfers. Baseline: 17.03 sec Goal status: IN PROGRESS  LONG TERM GOALS: Target date: 02/01/2024  Patient will be independent with advanced/ongoing HEP to facilitate ability to maintain/progress functional gains from skilled physical therapy services. Baseline:  Goal status: IN PROGRESS  2.  Patient will be able to ambulate 600' with or w/o LRAD on variable surfaces including curb and stair negotiation with single rail with good safety to access community.  Baseline:  Goal status: IN PROGRESS  3. Patient will demonstrate improved  B LE strength to >/=  4 to 4+/5 for improved stability and ease of mobility . Baseline: Refer to above LE MMT table Goal status: IN PROGRESS  4.  Patient will improve Berg score by at least 8 points or to >/= 52/56 to improve safety and stability with ADLs in standing and reduce risk for falls. (MCID= 8 points)  Baseline: 52/26 (12/10/23) Goal status: MET  5. Patient will improve FGA score by at least 4 points or to at least 21/30 to improve gait stability and reduce risk for falls. Baseline: 20/30 (12/10/23) Goal status: IN PROGRESS   PLAN:  PT FREQUENCY: 2x/week  PT DURATION: 8 weeks  PLANNED INTERVENTIONS: 62952- PT Re-evaluation, 97750- Physical Performance Testing, 97110-Therapeutic exercises, 97530- Therapeutic activity, [id number]- Neuromuscular re-education, 97535- Self Care, [id number]- Manual therapy, [phone]- Gait training, [phone]- Electrical stimulation (unattended), 97016- Vasopneumatic device, 97035- Ultrasound, 66440- Ionotophoresis 4mg /ml Dexamethasone , Patient/Family education, Balance training, Stair training, Taping, Dry Needling, Cryotherapy, and Moist heat  PLAN FOR NEXT SESSION: provide education on fall risk reduction; progress LE flexibility and strengthening; initiate balance training   Francisco Irving, PT 12/16/2023, 2:26 PM   Date of referral: 10/27/23 Referring provider: Dorrene Gaucher, NP Referring diagnosis? R53.83 (ICD-10-CM) - Fatigue, unspecified type  Treatment diagnosis? (if different than referring diagnosis)  Muscle weakness (generalized)  Unsteadiness on feet  History of falling  Difficulty in walking, not elsewhere classified  What was this (referring dx) caused by? Fall, Ongoing Issue, and Unspecified  Nature of Condition: Chronic (continuous duration > 3 months)   Laterality: Both  Current Functional Measure Score: Other ABC scale 1310 / 1600 = 81.9 %   Objective measurements identify impairments when they are compared to normal values, the uninvolved extremity,  and prior level of function.  []  Yes  []  No  Objective assessment of functional ability: Moderate functional limitations   Briefly describe symptoms: Patient presents with physical impairments of impaired activity tolerance, impaired standing balance, impaired ambulation, and decreased safety awareness impacting safe and independent functional mobility.  Examination revealed increased risk for falls and functional decline as evidenced by the following objective test measures: 5xSTS of 17.03 sec (>15 sec indicates increased risk for falls and decreased BLE power). Further assessment of Berg, FGA and 6 minute walk test to be completed next visit.  How did symptoms start: gradual onset  Average pain intensity:  Last 24 hours: 0/10  Past week: 4-5/10  How often does the pt experience symptoms? Occasionally  How much have the symptoms interfered with usual daily activities? A little bit  How has condition changed since care began at this facility? NA - initial visit  In general, how is the patients overall health? Very Good  Onset date: ~1 year   BACK PAIN (STarT Back Screening Tool) - (When applicable): N/A  Has your back pain spread down your leg(s) at sometime in the last 2 weeks? []  Yes   []  No Have you had pain in the shoulder or neck at sometime in the past 2 weeks? []  Yes   []  No Have you only walked short distances because of your back pain? []  Yes   []  No In the past 2 weeks, have you dressed more slowly than usual because of your back pain? []  Yes   []  No Do you think it is not really safe for person with a condition like yours to be physically active? []  Yes   []  No Have worrying thoughts been going through your mind a  lot of the time? []  Yes   []  No Do you feel that your back pain is terrible and it is never going to get any better? []  Yes   []  No In general, have you stopped enjoying all the things you usually enjoy? []  Yes   []  No Overall, how bothersome has your  back pain been in the last 2 weeks? []  Not at all   []  Slightly     []  Moderate   []  Very much     []  Extremely

## 2023-12-18 ENCOUNTER — Encounter: Payer: Self-pay | Admitting: Physical Therapy

## 2023-12-18 ENCOUNTER — Ambulatory Visit: Admitting: Physical Therapy

## 2023-12-18 DIAGNOSIS — M6281 Muscle weakness (generalized): Secondary | ICD-10-CM | POA: Diagnosis not present

## 2023-12-18 DIAGNOSIS — Z9181 History of falling: Secondary | ICD-10-CM

## 2023-12-18 DIAGNOSIS — R262 Difficulty in walking, not elsewhere classified: Secondary | ICD-10-CM

## 2023-12-18 DIAGNOSIS — R2681 Unsteadiness on feet: Secondary | ICD-10-CM

## 2023-12-18 NOTE — Therapy (Signed)
 OUTPATIENT PHYSICAL THERAPY TREATMENT   Patient Name: Angelica Davis MRN: [id number] DOB:1940-09-03, 83 y.o., female Today's Date: 12/18/2023   END OF SESSION:  PT End of Session - 12/18/23 1013     Visit Number 4    Date for PT Re-Evaluation 02/01/24    Authorization Type [employer] Medicare    PT Start Time 1013    PT Stop Time 1100    PT Time Calculation (min) 47 min    Activity Tolerance Patient tolerated treatment well;Patient limited by fatigue    Behavior During Therapy [facility] for tasks assessed/performed            Past Medical History:  Diagnosis Date   Arthritis    Cataract    bilateral, has had removed   Colitis    Diverticulosis    History of blood transfusion    after birth of child   Hx of colonic polyps - adenomas 08/16/2002   Hyperlipidemia    Skin cancer    skin- On bridge of nose, basal cell carcinoma per patient   Past Surgical History:  Procedure Laterality Date   CATARACT EXTRACTION Bilateral 01/13/2019   COLONOSCOPY  last 05/12/2014   polyps removed 2015, 2020 TA x1   KNEE ARTHROSCOPY Left 06/2007   Dr. Jinger Mount   MOHS SURGERY     bridge of nose x 2 (unsure of dates ?2021 and 2018)   PARTIAL KNEE ARTHROPLASTY Right 08/27/2021   Procedure: UNICOMPARTMENTAL KNEE;  Surgeon: Osa Blase, MD;  Location: [facility];  Service: Orthopedics;  Laterality: Right;   TONSILLECTOMY     TOTAL KNEE ARTHROPLASTY Left 12/2008   Dr. Jinger Mount   Patient Active Problem List   Diagnosis Date Noted   Dyspnea on exertion 10/27/2023   Fatigue 10/27/2023   Preventative health care 10/27/2023   Skin cancer 09/17/2022   Urinary incontinence 09/17/2022   Cough 09/17/2022   S/P right unicompartmental knee replacement 08/27/2021   Osteoarthritis of right knee 08/06/2021   Pain of left hand 11/16/2019   Trigger finger of left hand 11/16/2019   Hyperlipidemia 09/04/2015   Hx of colonic polyps - adenomas 08/16/2002    PCP: Dorrene Gaucher, NP   REFERRING PROVIDER:  Dorrene Gaucher, NP   REFERRING DIAG: R53.83 (ICD-10-CM) - Fatigue, unspecified type   C/o LE weakness, please evaluate for strength/balance.   THERAPY DIAG:  Muscle weakness (generalized)  Unsteadiness on feet  Difficulty in walking, not elsewhere classified  History of falling  RATIONALE FOR EVALUATION AND TREATMENT: Rehabilitation  ONSET DATE: up to 1 yr  NEXT MD VISIT: 11/15/2024   SUBJECTIVE:  SUBJECTIVE STATEMENT: Doing okay today.  Still reports difficulty with balance, some near falls but no fall, reports difficulty putting on pants  Pt reports her house is [place] a hill - every time she has to take the trash out or go for the mail she finds herself feeling weak and out of breath.  She volunteers at [employer] where she slid/fell out of a chair to the floor ~1 month ago and could not get up.  PAIN: Are you having pain? Yes: NPRS scale: 3/10 currently, up to 4-5/10  Pain location: L lateral knee  Pain description: occasional throbbing  Aggravating factors: unpredictable - sometimes sitting or prolonged standing but not consistent  Relieving factors: resolves on its own after 10-15 minutes   PERTINENT HISTORY:  Arthritis, R unicompartmental knee replacement 08/2021, L TKA 12/2008, colitis, diverticulosis, HLD, skin cancer, DOE, fatigue, urinary incontinence  PRECAUTIONS: Fall  RED FLAGS: None  WEIGHT BEARING RESTRICTIONS: No  FALLS:  Has patient fallen in last 6 months? Yes. Number of falls 1 - fall out of a chair/stool   LIVING ENVIRONMENT: Lives with: lives alone Lives in: House/apartment Stairs: Yes: External: 5-6 steps; on right going up, on left going up, and can reach both Has following equipment at home: Single point cane, Walker - 2 wheeled, and Ramped  entry  OCCUPATION: Retired  PLOF: Independent and Leisure: no regular exercise, Comptroller at [employer]   PATIENT GOALS: To be able t walk/move around confidently.   OBJECTIVE: (objective measures completed at initial evaluation unless otherwise dated)  DIAGNOSTIC FINDINGS:  N/A  COGNITION: Overall cognitive status: Within functional limits for tasks assessed   SENSATION: WFL  COORDINATION: LE gross motor WFL  POSTURE:  rounded shoulders, forward head, and flexed trunk   MUSCLE LENGTH: Hamstrings: mild/mod tight B ITB: mild/mod tight B Piriformis: mod tight B Hip flexors: mod tight B Quads: mild/mod tight B Heelcord: mild/mod tight B  LOWER EXTREMITY ROM:    B knee grossly 0-115  LOWER EXTREMITY MMT:    MMT Right eval Left eval  Hip flexion 4- 4-  Hip extension 4- 4-  Hip abduction 4- 4-  Hip adduction 4- 4-  Hip internal rotation 4- 4-  Hip external rotation 4- 4  Knee flexion 3+ 3+  Knee extension 4+ 4+  Ankle dorsiflexion 4 4  Ankle plantarflexion    Ankle inversion    Ankle eversion    (Blank rows = not tested)  BED MOBILITY:  Independent  TRANSFERS: Assistive device utilized: None  Sit to stand: Complete Independence Stand to sit: Complete Independence Chair to chair: Complete Independence Floor: NT  GAIT: Distance walked: Clinic distances Assistive device utilized: None Level of assistance: Complete Independence Gait pattern: WFL   FUNCTIONAL TESTS:  5 times sit to stand: 17.03 sec Timed up and go (TUG): 9.28 sec 6 minute walk test: see below (12/10/23) 10 meter walk test: 8.93 sec; Gait speed = 3.67 ft/sec Berg Balance Scale:  52/56 (12/10/23); 52-55/56 indicates lower (> 25%) risk for falls Functional gait assessment: 20/30 (12/10/23); 19-24/30 indicates medium risk for falls  12/10/23: 6 Minute Walk- Baseline  BP (mmHg) 162/80   HR (bpm) 77   02 Sat (%RA) 96 %   Modified Borg Scale for Dyspnea 0- Nothing at all   Perceived Rate of  Exertion (Borg) 6-   6 Minute walk- Post Test  BP (mmHg) 168/78   HR (bpm) 97   02 Sat (%RA) 97 %   Modified Borg Scale for  Dyspnea 3- Moderate shortness of breath or breathing difficulty   Perceived Rate of Exertion (Borg) 12-   6 minute walk test results   Aerobic Endurance Distance Walked 1144', Normal range for females 80-90: 730' - 1840'  Standardized Balance Assessment  Standardized Balance Assessment Berg Balance Test   Berg Balance Test  Sit to Stand Able to stand without using hands and stabilize independently   Standing Unsupported Able to stand safely 2 minutes   Sitting with Back Unsupported but Feet Supported on Floor or Stool Able to sit safely and securely 2 minutes   Stand to Sit Sits safely with minimal use of hands   Transfers Able to transfer safely, minor use of hands   Standing Unsupported with Eyes Closed Able to stand 10 seconds safely   Standing Unsupported with Feet Together Able to place feet together independently and stand 1 minute safely   From Standing, Reach Forward with Outstretched Arm Can reach forward >12 cm safely (5)   From Standing Position, Pick up Object from Floor Able to pick up shoe safely and easily   From Standing Position, Turn to Look Behind Over each Shoulder Looks behind from both sides and weight shifts well   Turn 360 Degrees Able to turn 360 degrees safely in 4 seconds or less   Standing Unsupported, Alternately Place Feet on Step/Stool Able to stand independently and safely and complete 8 steps in 20 seconds   Standing Unsupported, One Foot in Front Able to plae foot ahead of the other independently and hold 30 seconds   Standing on One Leg Able to lift leg independently and hold equal to or more than 3 seconds   Total Score 52   Berg comment: 52-55 lower (> 25%) risk for falls   Functional Gait  Assessment  Gait Level Surface Walks 20 ft in less than 7 sec but greater than 5.5 sec, uses assistive device, slower speed, mild gait  deviations, or deviates 6-10 in outside of the 12 in walkway width.   Change in Gait Speed Able to smoothly change walking speed without loss of balance or gait deviation. Deviate no more than 6 in outside of the 12 in walkway width.   Gait with Horizontal Head Turns Performs head turns smoothly with slight change in gait velocity (eg, minor disruption to smooth gait path), deviates 6-10 in outside 12 in walkway width, or uses an assistive device.   Gait with Vertical Head Turns Performs task with slight change in gait velocity (eg, minor disruption to smooth gait path), deviates 6 - 10 in outside 12 in walkway width or uses assistive device   Gait and Pivot Turn Pivot turns safely within 3 sec and stops quickly with no loss of balance.   Step Over Obstacle Is able to step over 2 stacked shoe boxes taped together (9 in total height) without changing gait speed. No evidence of imbalance.   Gait with Narrow Base of Support Ambulates 4-7 steps.   Gait with Eyes Closed Walks 20 ft, slow speed, abnormal gait pattern, evidence for imbalance, deviates 10-15 in outside 12 in walkway width. Requires more than 9 sec to ambulate 20 ft.   Ambulating Backwards Walks 20 ft, slow speed, abnormal gait pattern, evidence for imbalance, deviates 10-15 in outside 12 in walkway width.   Steps Alternating feet, must use rail.   Total Score 20   FGA comment: 19-24 = medium risk fall     PATIENT SURVEYS:  ABC scale 1310 /  1600 = 81.9 %; >80% indicates a high level of physical functioning   TODAY'S TREATMENT:  12/18/23 Nustep level 5 x 5 minutes Supine passive HS, piriformis, adductor stretches Supine bridges Supine green tband clamshells Side stepping Cone toe touch  On airex balance, head turns, eyes closed, ball toss On airex cone toe touches Backward walking Side step on and off airex Direction changes Star with colored dots PT calling out colors and her stepping toward SLS x 5 seconds each Talked about  getting up off the floor  12/16/2023 THERAPEUTIC EXERCISE: To improve strength, endurance, and flexibility.  Demonstration, verbal and tactile cues throughout for technique.  Rec Bike - L1 x 6 min Seated hamstring curls with looped RTB at ankles 2 x 10 bil S/L RTB clam 2 x 10 Hooklying L HS stretch with strap x 30 Supine L HS stretch with strap x 30 Supine L ITB stretch with strap x 30 Hooklying L KTOS piriformis with strap for added reach x 30 Mod thomas L quad/hip flexor stretch with strap x 30  NEUROMUSCULAR RE-EDUCATION: To improve balance, coordination, kinesthesia, posture, and proprioception.  Standing alt hip abduction with looped RTB at thighs x 10 bil Standing alt hip abduction with looped RTB at ankles x 10 bil Standing alt hip extension with looped RTB at ankles x 10 bil Standing alt hip flexion march with looped RTB at midfeet x 10 bil   12/10/2023 PHYSICAL PERFORMANCE TEST or MEASUREMENT:  6 Minute Walk- Baseline   BP (mmHg) 162/80    HR (bpm) 77    02 Sat (%RA) 96 %    Modified Borg Scale for Dyspnea 0- Nothing at all    Perceived Rate of Exertion (Borg) 6-      6 Minute walk- Post Test   BP (mmHg) 168/78    HR (bpm) 97    02 Sat (%RA) 97 %    Modified Borg Scale for Dyspnea 3- Moderate shortness of breath or breathing difficulty    Perceived Rate of Exertion (Borg) 12-      6 minute walk test results    Aerobic Endurance Distance Walked 1144', Normal range for females 80-90: 730' - 1840'     Standardized Balance Assessment   Standardized Balance Assessment Berg Balance Test      Berg Balance Test   Sit to Stand Able to stand without using hands and stabilize independently    Standing Unsupported Able to stand safely 2 minutes    Sitting with Back Unsupported but Feet Supported on Floor or Stool Able to sit safely and securely 2 minutes    Stand to Sit Sits safely with minimal use of hands    Transfers Able to transfer safely, minor use of hands     Standing Unsupported with Eyes Closed Able to stand 10 seconds safely    Standing Unsupported with Feet Together Able to place feet together independently and stand 1 minute safely    From Standing, Reach Forward with Outstretched Arm Can reach forward >12 cm safely (5)    From Standing Position, Pick up Object from Floor Able to pick up shoe safely and easily    From Standing Position, Turn to Look Behind Over each Shoulder Looks behind from both sides and weight shifts well    Turn 360 Degrees Able to turn 360 degrees safely in 4 seconds or less    Standing Unsupported, Alternately Place Feet on Step/Stool Able to stand independently and safely and complete 8 steps in  20 seconds    Standing Unsupported, One Foot in Front Able to plae foot ahead of the other independently and hold 30 seconds    Standing on One Leg Able to lift leg independently and hold equal to or more than 3 seconds    Total Score 52    Berg comment: 52-55 lower (> 25%) risk for falls      Functional Gait  Assessment   Gait Level Surface Walks 20 ft in less than 7 sec but greater than 5.5 sec, uses assistive device, slower speed, mild gait deviations, or deviates 6-10 in outside of the 12 in walkway width.    Change in Gait Speed Able to smoothly change walking speed without loss of balance or gait deviation. Deviate no more than 6 in outside of the 12 in walkway width.    Gait with Horizontal Head Turns Performs head turns smoothly with slight change in gait velocity (eg, minor disruption to smooth gait path), deviates 6-10 in outside 12 in walkway width, or uses an assistive device.    Gait with Vertical Head Turns Performs task with slight change in gait velocity (eg, minor disruption to smooth gait path), deviates 6 - 10 in outside 12 in walkway width or uses assistive device    Gait and Pivot Turn Pivot turns safely within 3 sec and stops quickly with no loss of balance.    Step Over Obstacle Is able to step over 2 stacked  shoe boxes taped together (9 in total height) without changing gait speed. No evidence of imbalance.    Gait with Narrow Base of Support Ambulates 4-7 steps.    Gait with Eyes Closed Walks 20 ft, slow speed, abnormal gait pattern, evidence for imbalance, deviates 10-15 in outside 12 in walkway width. Requires more than 9 sec to ambulate 20 ft.    Ambulating Backwards Walks 20 ft, slow speed, abnormal gait pattern, evidence for imbalance, deviates 10-15 in outside 12 in walkway width.    Steps Alternating feet, must use rail.    Total Score 20    FGA comment: 19-24 = medium risk fall      12/07/2023  SELF CARE:  Reviewed eval findings and role of PT in addressing identified deficits as well as instruction in initial HEP (see below).    PATIENT EDUCATION:  Education details: HEP review and HEP update - LE stretching  Person educated: Patient Education method: Explanation, Demonstration, Tactile cues, Verbal cues, and Handouts Education comprehension: verbalized understanding, returned demonstration, verbal cues required, tactile cues required, and needs further education  HOME EXERCISE PROGRAM: Access Code: [email or web address] URL: [email or web address] Date: 12/16/2023 Prepared by: Felecia Hopper  Exercises - Supine Hamstring Stretch with Strap  - 1-2 x daily - 7 x weekly - 3 reps - 30 sec hold - Supine Iliotibial Band Stretch with Strap  - 1-2 x daily - 7 x weekly - 3 reps - 30 sec hold - Supine Piriformis Stretch with Foot on Ground  - 1-2 x daily - 7 x weekly - 3 reps - 30 sec hold - Hip Flexor Stretch with Strap on Table  - 1-2 x daily - 7 x weekly - 3 reps - 30 sec hold - Clam with Resistance  - 1 x daily - 5-7 x weekly - 2 sets - 10 reps - 3-5 sec hold - Standing Hip Abduction with Resistance at Ankles and Counter Support  - 1 x daily - 5-7 x weekly - 2 sets - 10 reps -  3 sec hold - Standing Hip Extension with Resistance at Ankles and Counter Support  - 1 x daily - 5-7 x weekly -  2 sets - 10 reps - 3 sec hold - Marching with Resistance  - 1 x daily - 5-7 x weekly - 2 sets - 10 reps - 3 sec hold - Seated Hamstring Curls with Resistance  - 1 x daily - 5-7 x weekly - 2 sets - 10 reps - 3 sec hold   ASSESSMENT:  CLINICAL IMPRESSION: Sabre reports she was able to try the initial HEP at home.  Continued to work on flexibility, started some LE strength and really focused on balance today, she reports biggest issue is trying to put on pants, she did great with most things today but is really limited with the SLS and struggles with dynamic surfaces.  Ahriyah will benefit from continued skilled PT to address ongoing strength and balance deficits to improve mobility and activity tolerance with decreased pain interference and decreased risk for falls.   From EVAL: TONYE TANCREDI is a 83 y.o. female who was referred to physical therapy for evaluation and treatment for fatigue with associated LE weakness.  Patient presents with physical impairments of impaired activity tolerance, impaired standing balance, impaired ambulation, and decreased safety awareness impacting safe and independent functional mobility.  Examination revealed gait speed of 3.67 ft/sec, (2.62 ft/sec is needed for community access) and TUG of 9.28 sec (>13.5 sec indicates increased risk for falls) WNL however increased risk for falls and functional decline as evidenced by the following objective test measures:  5xSTS of 17.03 sec (>15 sec indicates increased risk for falls and decreased BLE power). Further assessment of Berg, FGA and 6 minute walk test to be completed next visit.  Paislynn will benefit from skilled PT to address above deficits to improve mobility and activity tolerance to help reach the maximal level of functional independence and mobility. Patient demonstrates understanding of this POC and is in agreement with this plan.   OBJECTIVE IMPAIRMENTS: Abnormal gait, decreased activity tolerance, decreased balance,  decreased mobility, difficulty walking, decreased strength, decreased safety awareness, increased fascial restrictions, impaired perceived functional ability, increased muscle spasms, improper body mechanics, postural dysfunction, and pain.   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   REHAB POTENTIAL: Good  CLINICAL DECISION MAKING: Evolving/moderate complexity  EVALUATION COMPLEXITY: Moderate   GOALS: Goals reviewed with patient? Yes  SHORT TERM GOALS: Target date: 01/04/2024  Patient will be independent with initial HEP to improve outcomes and carryover.  Baseline: Initial HEP provided on eval 12/10/23 - Pt has not yet attempted initial HEP Goal status: IN PROGRESS - 12/16/23 - HEP reviewed and updated  2.  Patient will be educated on strategies to decrease risk of falls.  Baseline:  Goal status: IN PROGRESS  3.  Patient will improve 5xSTS time to </= 15 seconds for improved efficiency and safety with transfers. Baseline: 17.03 sec Goal status: IN PROGRESS  LONG TERM GOALS: Target date: 02/01/2024  Patient will be independent with advanced/ongoing HEP to facilitate ability to maintain/progress functional gains from skilled physical therapy services. Baseline:  Goal status: IN PROGRESS  2.  Patient  will be able to ambulate 600' with or w/o LRAD on variable surfaces including curb and stair negotiation with single rail with good safety to access community.  Baseline:  Goal status: IN PROGRESS  3.  Patient will demonstrate improved B LE strength to >/= 4 to 4+/5 for improved stability and ease of mobility . Baseline: Refer to above LE MMT table Goal status: IN PROGRESS  4.  Patient will improve Berg score by at least 8 points or to >/= 52/56 to improve safety and stability with ADLs in standing and reduce risk for falls. (MCID= 8 points)  Baseline: 52/26 (12/10/23) Goal status: MET  5. Patient will improve FGA score by at least 4 points or to at least 21/30 to improve gait stability and reduce risk for falls. Baseline: 20/30 (12/10/23) Goal status: IN PROGRESS   PLAN:  PT FREQUENCY: 2x/week  PT DURATION: 8 weeks  PLANNED INTERVENTIONS: 09811- PT Re-evaluation, 97750- Physical Performance Testing, 97110-Therapeutic exercises, 97530- Therapeutic activity, [id number]- Neuromuscular re-education, 97535- Self Care, 91478- Manual therapy, [phone]- Gait training, [phone]- Electrical stimulation (unattended), [id number]- Vasopneumatic device, 97035- Ultrasound, [id number]- Ionotophoresis 4mg /ml Dexamethasone , Patient/Family education, Balance training, Stair training, Taping, Dry Needling, Cryotherapy, and Moist heat  PLAN FOR NEXT SESSION: provide education on fall risk reduction; progress LE flexibility and strengthening; initiate balance training, may look at getting up from the floor   Hollis Lurie, PT 12/18/2023, 10:14 AM   Date of referral: 10/27/23 Referring provider: Dorrene Gaucher, NP Referring diagnosis? R53.83 (ICD-10-CM) - Fatigue, unspecified type  Treatment diagnosis? (if different than referring diagnosis)  Muscle weakness (generalized)  Unsteadiness on feet  Difficulty in walking, not elsewhere classified  History of falling  What was this (referring dx) caused by? Fall, Ongoing Issue, and Unspecified  Nature of Condition: Chronic (continuous duration > 3 months)   Laterality: Both  Current Functional Measure Score: Other ABC scale 1310 / 1600 = 81.9 %   Objective  measurements identify impairments when they are compared to normal values, the uninvolved extremity, and prior level of function.  []  Yes  []  No  Objective assessment of functional ability: Moderate functional limitations   Briefly describe symptoms: Patient presents with physical impairments of impaired activity tolerance, impaired standing balance, impaired ambulation, and decreased safety awareness impacting safe and independent functional mobility.  Examination revealed increased risk for falls and functional decline as evidenced by the following objective test measures: 5xSTS of 17.03 sec (>15 sec indicates increased risk for falls and decreased BLE power). Further assessment of Berg, FGA and 6 minute walk test to be completed next visit.  How did symptoms start: gradual onset  Average pain intensity:  Last 24 hours: 0/10  Past week: 4-5/10  How often does the pt experience symptoms? Occasionally  How much have the symptoms interfered with usual daily activities? A little bit  How has condition changed since care began at this facility? NA - initial visit  In general, how is the patients overall health? Very Good  Onset date: ~1 year   BACK PAIN (STarT Back Screening Tool) - (When applicable): N/A  Has your back pain spread down your leg(s) at sometime in the last 2 weeks? []  Yes   []  No Have you had pain in the shoulder or neck at sometime in the past 2 weeks? []  Yes   []  No Have you only walked short distances because of your back pain? []  Yes   []  No In the past 2 weeks, have you dressed more slowly  than usual because of your back pain? []  Yes   []  No Do you think it is not really safe for person with a condition like yours to be physically active? []  Yes   []  No Have worrying thoughts been going through your mind a lot of the time? []  Yes   []  No Do you feel that your back pain is terrible and it is never going to get any better? []  Yes   []  No In general, have you  stopped enjoying all the things you usually enjoy? []  Yes   []  No Overall, how bothersome has your back pain been in the last 2 weeks? []  Not at all   []  Slightly     []  Moderate   []  Very much     []  Extremely

## 2023-12-21 ENCOUNTER — Ambulatory Visit

## 2023-12-21 DIAGNOSIS — R2681 Unsteadiness on feet: Secondary | ICD-10-CM

## 2023-12-21 DIAGNOSIS — M6281 Muscle weakness (generalized): Secondary | ICD-10-CM | POA: Diagnosis not present

## 2023-12-21 DIAGNOSIS — R262 Difficulty in walking, not elsewhere classified: Secondary | ICD-10-CM

## 2023-12-21 DIAGNOSIS — Z9181 History of falling: Secondary | ICD-10-CM

## 2023-12-21 NOTE — Therapy (Signed)
 OUTPATIENT PHYSICAL THERAPY TREATMENT   Patient Name: Angelica Davis MRN: [id number] DOB:[birth year], 83 y.o., female Today's Date: 12/21/2023   END OF SESSION:  PT End of Session - 12/21/23 1017     Visit Number 5    Date for PT Re-Evaluation 02/01/24    Authorization Type [employer]    Authorization Time Period 12/07/23 - 02/01/24    Authorization - Visit Number 5    Authorization - Number of Visits 8    Progress Note Due on Visit 10    PT Start Time 0931    PT Stop Time 1015    PT Time Calculation (min) 44 min    Activity Tolerance Patient tolerated treatment well    Behavior During Therapy [facility] for tasks assessed/performed             Past Medical History:  Diagnosis Date   Arthritis    Cataract    bilateral, has had removed   Colitis    Diverticulosis    History of blood transfusion    after birth of child   Hx of colonic polyps - adenomas 08/16/2002   Hyperlipidemia    Skin cancer    skin- On bridge of nose, basal cell carcinoma per patient   Past Surgical History:  Procedure Laterality Date   CATARACT EXTRACTION Bilateral 01/13/2019   COLONOSCOPY  last 05/12/2014   polyps removed 2015, 2020 TA x1   KNEE ARTHROSCOPY Left 06/2007   Dr. Jinger Mount   MOHS SURGERY     bridge of nose x 2 (unsure of dates ?2021 and 2018)   PARTIAL KNEE ARTHROPLASTY Right 08/27/2021   Procedure: UNICOMPARTMENTAL KNEE;  Surgeon: Osa Blase, MD;  Location: [facility];  Service: Orthopedics;  Laterality: Right;   TONSILLECTOMY     TOTAL KNEE ARTHROPLASTY Left 12/2008   Dr. Jinger Mount   Patient Active Problem List   Diagnosis Date Noted   Dyspnea on exertion 10/27/2023   Fatigue 10/27/2023   Preventative health care 10/27/2023   Skin cancer 09/17/2022   Urinary incontinence 09/17/2022   Cough 09/17/2022   S/P right unicompartmental knee replacement 08/27/2021   Osteoarthritis of right knee 08/06/2021   Pain of left hand 11/16/2019   Trigger finger of left hand 11/16/2019    Hyperlipidemia 09/04/2015   Hx of colonic polyps - adenomas 08/16/2002    PCP: Dorrene Gaucher, NP   REFERRING PROVIDER: Dorrene Gaucher, NP   REFERRING DIAG: R53.83 (ICD-10-CM) - Fatigue, unspecified type   C/o LE weakness, please evaluate for strength/balance.   THERAPY DIAG:  Muscle weakness (generalized)  Unsteadiness on feet  Difficulty in walking, not elsewhere classified  History of falling  RATIONALE FOR EVALUATION AND TREATMENT: Rehabilitation  ONSET DATE: up to 1 yr  NEXT MD VISIT: 11/15/2024   SUBJECTIVE:  SUBJECTIVE STATEMENT: Pt reports she was sore after last visit  Pt reports [place] - every time she has to take the trash out or go for the mail she finds herself feeling weak and out of breath.  She volunteers at [employer] where she slid/fell out of a chair to the floor ~1 month ago and could not get up.  PAIN: Are you having pain? Yes: NPRS scale: 3/10 currently, up to 4-5/10  Pain location: L lateral knee  Pain description: occasional throbbing  Aggravating factors: unpredictable - sometimes sitting or prolonged standing but not consistent  Relieving factors: resolves on its own after 10-15 minutes   PERTINENT HISTORY:  Arthritis, R unicompartmental knee replacement 08/2021, L TKA 12/2008, colitis, diverticulosis, HLD, skin cancer, DOE, fatigue, urinary incontinence  PRECAUTIONS: Fall  RED FLAGS: None  WEIGHT BEARING RESTRICTIONS: No  FALLS:  Has patient fallen in last 6 months? Yes. Number of falls 1 - fall out of a chair/stool   LIVING ENVIRONMENT: Lives with: lives alone Lives in: House/apartment Stairs: Yes: External: 5-6 steps; on right going up, on left going up, and can reach both Has following equipment at home: Single  point cane, Walker - 2 wheeled, and Ramped entry  OCCUPATION: Retired  PLOF: Independent and Leisure: no regular exercise, Comptroller at [employer]   PATIENT GOALS: To be able t walk/move around confidently.   OBJECTIVE: (objective measures completed at initial evaluation unless otherwise dated)  DIAGNOSTIC FINDINGS:  N/A  COGNITION: Overall cognitive status: Within functional limits for tasks assessed   SENSATION: WFL  COORDINATION: LE gross motor WFL  POSTURE:  rounded shoulders, forward head, and flexed trunk   MUSCLE LENGTH: Hamstrings: mild/mod tight B ITB: mild/mod tight B Piriformis: mod tight B Hip flexors: mod tight B Quads: mild/mod tight B Heelcord: mild/mod tight B  LOWER EXTREMITY ROM:    B knee grossly 0-115  LOWER EXTREMITY MMT:    MMT Right eval Left eval  Hip flexion 4- 4-  Hip extension 4- 4-  Hip abduction 4- 4-  Hip adduction 4- 4-  Hip internal rotation 4- 4-  Hip external rotation 4- 4  Knee flexion 3+ 3+  Knee extension 4+ 4+  Ankle dorsiflexion 4 4  Ankle plantarflexion    Ankle inversion    Ankle eversion    (Blank rows = not tested)  BED MOBILITY:  Independent  TRANSFERS: Assistive device utilized: None  Sit to stand: Complete Independence Stand to sit: Complete Independence Chair to chair: Complete Independence Floor: NT  GAIT: Distance walked: Clinic distances Assistive device utilized: None Level of assistance: Complete Independence Gait pattern: WFL   FUNCTIONAL TESTS:  5 times sit to stand: 17.03 sec Timed up and go (TUG): 9.28 sec 6 minute walk test: see below (12/10/23) 10 meter walk test: 8.93 sec; Gait speed = 3.67 ft/sec Berg Balance Scale:  52/56 (12/10/23); 52-55/56 indicates lower (> 25%) risk for falls Functional gait assessment: 20/30 (12/10/23); 19-24/30 indicates medium risk for falls  12/10/23: 6 Minute Walk- Baseline  BP (mmHg) 162/80   HR (bpm) 77   02 Sat (%RA) 96 %   Modified Borg Scale for  Dyspnea 0- Nothing at all   Perceived Rate of Exertion (Borg) 6-   6 Minute walk- Post Test  BP (mmHg) 168/78   HR (bpm) 97   02 Sat (%RA) 97 %   Modified Borg Scale for Dyspnea 3- Moderate shortness of breath or breathing difficulty   Perceived  Rate of Exertion (Borg) 12-   6 minute walk test results   Aerobic Endurance Distance Walked 1144', Normal range for females 80-90: 730' - 1840'  Standardized Balance Assessment  Standardized Balance Assessment Berg Balance Test   Berg Balance Test  Sit to Stand Able to stand without using hands and stabilize independently   Standing Unsupported Able to stand safely 2 minutes   Sitting with Back Unsupported but Feet Supported on Floor or Stool Able to sit safely and securely 2 minutes   Stand to Sit Sits safely with minimal use of hands   Transfers Able to transfer safely, minor use of hands   Standing Unsupported with Eyes Closed Able to stand 10 seconds safely   Standing Unsupported with Feet Together Able to place feet together independently and stand 1 minute safely   From Standing, Reach Forward with Outstretched Arm Can reach forward >12 cm safely (5)   From Standing Position, Pick up Object from Floor Able to pick up shoe safely and easily   From Standing Position, Turn to Look Behind Over each Shoulder Looks behind from both sides and weight shifts well   Turn 360 Degrees Able to turn 360 degrees safely in 4 seconds or less   Standing Unsupported, Alternately Place Feet on Step/Stool Able to stand independently and safely and complete 8 steps in 20 seconds   Standing Unsupported, One Foot in Front Able to plae foot ahead of the other independently and hold 30 seconds   Standing on One Leg Able to lift leg independently and hold equal to or more than 3 seconds   Total Score 52   Berg comment: 52-55 lower (> 25%) risk for falls   Functional Gait  Assessment  Gait Level Surface Walks 20 ft in less than 7 sec but greater than 5.5 sec, uses  assistive device, slower speed, mild gait deviations, or deviates 6-10 in outside of the 12 in walkway width.   Change in Gait Speed Able to smoothly change walking speed without loss of balance or gait deviation. Deviate no more than 6 in outside of the 12 in walkway width.   Gait with Horizontal Head Turns Performs head turns smoothly with slight change in gait velocity (eg, minor disruption to smooth gait path), deviates 6-10 in outside 12 in walkway width, or uses an assistive device.   Gait with Vertical Head Turns Performs task with slight change in gait velocity (eg, minor disruption to smooth gait path), deviates 6 - 10 in outside 12 in walkway width or uses assistive device   Gait and Pivot Turn Pivot turns safely within 3 sec and stops quickly with no loss of balance.   Step Over Obstacle Is able to step over 2 stacked shoe boxes taped together (9 in total height) without changing gait speed. No evidence of imbalance.   Gait with Narrow Base of Support Ambulates 4-7 steps.   Gait with Eyes Closed Walks 20 ft, slow speed, abnormal gait pattern, evidence for imbalance, deviates 10-15 in outside 12 in walkway width. Requires more than 9 sec to ambulate 20 ft.   Ambulating Backwards Walks 20 ft, slow speed, abnormal gait pattern, evidence for imbalance, deviates 10-15 in outside 12 in walkway width.   Steps Alternating feet, must use rail.   Total Score 20   FGA comment: 19-24 = medium risk fall     PATIENT SURVEYS:  ABC scale 1310 / 1600 = 81.9 %; >80% indicates a high level of physical functioning  TODAY'S TREATMENT:  12/21/23 Nustep level 5 x 6 minutes Supine HS stretch x 30 strap Supine piriformis stretch x30 Supine quad stretch x 30  Supine ITB stretch x 30: S/L clamshell RTB 15x3 B Standing:  Hip extension 3lb x 10 B  Hip abduction 3lb x 10 B  Marching x 10 3lb B  HS curls RTB 2 x 10 B Education on fall risk  12/18/23 Nustep level 5 x 5 minutes Supine passive HS,  piriformis, adductor stretches Supine bridges Supine green tband clamshells Side stepping Cone toe touch  On airex balance, head turns, eyes closed, ball toss On airex cone toe touches Backward walking Side step on and off airex Direction changes Star with colored dots PT calling out colors and her stepping toward SLS x 5 seconds each Talked about getting up off the floor  12/16/2023 THERAPEUTIC EXERCISE: To improve strength, endurance, and flexibility.  Demonstration, verbal and tactile cues throughout for technique.  Rec Bike - L1 x 6 min Seated hamstring curls with looped RTB at ankles 2 x 10 bil S/L RTB clam 2 x 10 Hooklying L HS stretch with strap x 30 Supine L HS stretch with strap x 30 Supine L ITB stretch with strap x 30 Hooklying L KTOS piriformis with strap for added reach x 30 Mod thomas L quad/hip flexor stretch with strap x 30  NEUROMUSCULAR RE-EDUCATION: To improve balance, coordination, kinesthesia, posture, and proprioception.  Standing alt hip abduction with looped RTB at thighs x 10 bil Standing alt hip abduction with looped RTB at ankles x 10 bil Standing alt hip extension with looped RTB at ankles x 10 bil Standing alt hip flexion march with looped RTB at midfeet x 10 bil   12/10/2023 PHYSICAL PERFORMANCE TEST or MEASUREMENT:  6 Minute Walk- Baseline   BP (mmHg) 162/80    HR (bpm) 77    02 Sat (%RA) 96 %    Modified Borg Scale for Dyspnea 0- Nothing at all    Perceived Rate of Exertion (Borg) 6-      6 Minute walk- Post Test   BP (mmHg) 168/78    HR (bpm) 97    02 Sat (%RA) 97 %    Modified Borg Scale for Dyspnea 3- Moderate shortness of breath or breathing difficulty    Perceived Rate of Exertion (Borg) 12-      6 minute walk test results    Aerobic Endurance Distance Walked 1144', Normal range for females 80-90: 730' - 1840'     Standardized Balance Assessment   Standardized Balance Assessment Berg Balance Test      Berg Balance Test    Sit to Stand Able to stand without using hands and stabilize independently    Standing Unsupported Able to stand safely 2 minutes    Sitting with Back Unsupported but Feet Supported on Floor or Stool Able to sit safely and securely 2 minutes    Stand to Sit Sits safely with minimal use of hands    Transfers Able to transfer safely, minor use of hands    Standing Unsupported with Eyes Closed Able to stand 10 seconds safely    Standing Unsupported with Feet Together Able to place feet together independently and stand 1 minute safely    From Standing, Reach Forward with Outstretched Arm Can reach forward >12 cm safely (5)    From Standing Position, Pick up Object from Floor Able to pick up shoe safely and easily    From Standing Position, Turn  to Look Behind Over each Shoulder Looks behind from both sides and weight shifts well    Turn 360 Degrees Able to turn 360 degrees safely in 4 seconds or less    Standing Unsupported, Alternately Place Feet on Step/Stool Able to stand independently and safely and complete 8 steps in 20 seconds    Standing Unsupported, One Foot in Front Able to plae foot ahead of the other independently and hold 30 seconds    Standing on One Leg Able to lift leg independently and hold equal to or more than 3 seconds    Total Score 52    Berg comment: 52-55 lower (> 25%) risk for falls      Functional Gait  Assessment   Gait Level Surface Walks 20 ft in less than 7 sec but greater than 5.5 sec, uses assistive device, slower speed, mild gait deviations, or deviates 6-10 in outside of the 12 in walkway width.    Change in Gait Speed Able to smoothly change walking speed without loss of balance or gait deviation. Deviate no more than 6 in outside of the 12 in walkway width.    Gait with Horizontal Head Turns Performs head turns smoothly with slight change in gait velocity (eg, minor disruption to smooth gait path), deviates 6-10 in outside 12 in walkway width, or uses an assistive  device.    Gait with Vertical Head Turns Performs task with slight change in gait velocity (eg, minor disruption to smooth gait path), deviates 6 - 10 in outside 12 in walkway width or uses assistive device    Gait and Pivot Turn Pivot turns safely within 3 sec and stops quickly with no loss of balance.    Step Over Obstacle Is able to step over 2 stacked shoe boxes taped together (9 in total height) without changing gait speed. No evidence of imbalance.    Gait with Narrow Base of Support Ambulates 4-7 steps.    Gait with Eyes Closed Walks 20 ft, slow speed, abnormal gait pattern, evidence for imbalance, deviates 10-15 in outside 12 in walkway width. Requires more than 9 sec to ambulate 20 ft.    Ambulating Backwards Walks 20 ft, slow speed, abnormal gait pattern, evidence for imbalance, deviates 10-15 in outside 12 in walkway width.    Steps Alternating feet, must use rail.    Total Score 20    FGA comment: 19-24 = medium risk fall      12/07/2023  SELF CARE:  Reviewed eval findings and role of PT in addressing identified deficits as well as instruction in initial HEP (see below).    PATIENT EDUCATION:  Education details: HEP review and HEP update - LE stretching  Person educated: Patient Education method: Explanation, Demonstration, Tactile cues, Verbal cues, and Handouts Education comprehension: verbalized understanding, returned demonstration, verbal cues required, tactile cues required, and needs further education  HOME EXERCISE PROGRAM: Access Code: [email or web address] URL: [email or web address] Date: 12/21/2023 Prepared by: Miya Luviano  Exercises - Supine Hamstring Stretch with Strap  - 1-2 x daily - 7 x weekly - 3 reps - 30 sec hold - Supine Iliotibial Band Stretch with Strap  - 1-2 x daily - 7 x weekly - 3 reps - 30 sec hold - Supine Piriformis Stretch with Foot on Ground  - 1-2 x daily - 7 x weekly - 3 reps - 30 sec hold - Hip Flexor Stretch with Strap on Table  - 1-2 x  daily - 7 x weekly - 3 reps -  30 sec hold - Clam with Resistance  - 1 x daily - 5-7 x weekly - 2 sets - 10 reps - 3-5 sec hold - Standing Hip Abduction with Resistance at Ankles and Counter Support  - 1 x daily - 5-7 x weekly - 2 sets - 10 reps - 3 sec hold - Standing Hip Extension with Resistance at Ankles and Counter Support  - 1 x daily - 5-7 x weekly - 2 sets - 10 reps - 3 sec hold - Marching with Resistance  - 1 x daily - 5-7 x weekly - 2 sets - 10 reps - 3 sec hold - Standing Hamstring Curl with Resistance  - 1 x daily - 5-7 x weekly - 2 sets - 10 reps  Patient Education - What You Can Do to Prevent Falls   ASSESSMENT:  CLINICAL IMPRESSION: Today we reviewed patient's HEP for clarification. Pt was able to complete interventions with cues provided. We also reviewed fall prevention handout. Instructed through interventions for correct form and technique. Aiana will benefit from continued skilled PT to address ongoing strength and balance deficits to improve mobility and activity tolerance with decreased pain interference and decreased risk for falls.   From EVAL: Angelica Davis is a 83 y.o. female who was referred to physical therapy for evaluation and treatment for fatigue with associated LE weakness.  Patient presents with physical impairments of impaired activity tolerance, impaired standing balance, impaired ambulation, and decreased safety awareness impacting safe and independent functional mobility.  Examination revealed gait speed of 3.67 ft/sec, (2.62 ft/sec is needed for community access) and TUG of 9.28 sec (>13.5 sec indicates increased risk for falls) WNL however increased risk for falls and functional decline as evidenced by the following objective test measures:  5xSTS of 17.03 sec (>15 sec indicates increased risk for falls and decreased BLE power). Further assessment of Berg, FGA and 6 minute walk test to be completed next visit.  Bresha will benefit from skilled PT to address  above deficits to improve mobility and activity tolerance to help reach the maximal level of functional independence and mobility. Patient demonstrates understanding of this POC and is in agreement with this plan.   OBJECTIVE IMPAIRMENTS: Abnormal gait, decreased activity tolerance, decreased balance, decreased mobility, difficulty walking, decreased strength, decreased safety awareness, increased fascial restrictions, impaired perceived functional ability, increased muscle spasms, improper body mechanics, postural dysfunction, and pain.   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   REHAB POTENTIAL: Good  CLINICAL DECISION MAKING: Evolving/moderate complexity  EVALUATION COMPLEXITY: Moderate   GOALS: Goals reviewed with patient? Yes  SHORT TERM GOALS: Target date: 01/04/2024  Patient will be independent with initial HEP to improve outcomes and carryover.  Baseline: Initial HEP provided on eval 12/10/23 - Pt has not yet attempted initial HEP Goal status: IN PROGRESS - 12/16/23 - HEP reviewed and updated  2.  Patient will be educated on strategies to decrease risk of falls.  Baseline:  Goal status: MET- 12/21/23  3.  Patient will improve 5xSTS time to </= 15 seconds for improved efficiency and safety with transfers. Baseline: 17.03 sec Goal status: IN PROGRESS  LONG TERM GOALS: Target date:  02/01/2024  Patient will be independent with advanced/ongoing HEP to facilitate ability to maintain/progress functional  gains from skilled physical therapy services. Baseline:  Goal status: IN PROGRESS  2.  Patient will be able to ambulate 600' with or w/o LRAD on variable surfaces including curb and stair negotiation with single rail with good safety to access community.  Baseline:  Goal status: IN PROGRESS  3. Patient will demonstrate improved B LE strength to >/= 4 to 4+/5 for improved stability and ease of mobility . Baseline: Refer to above LE MMT table Goal status: IN PROGRESS  4.  Patient will improve Berg score by at least 8 points or to >/= 52/56 to improve safety and stability with ADLs in standing and reduce risk for falls. (MCID= 8 points)  Baseline: 52/26 (12/10/23) Goal status: MET  5. Patient will improve FGA score by at least 4 points or to at least 21/30 to improve gait stability and reduce risk for falls. Baseline: 20/30 (12/10/23) Goal status: IN PROGRESS   PLAN:  PT FREQUENCY: 2x/week  PT DURATION: 8 weeks  PLANNED INTERVENTIONS: 19147- PT Re-evaluation, 97750- Physical Performance Testing, 97110-Therapeutic exercises, 97530- Therapeutic activity, [id number]- Neuromuscular re-education, 97535- Self Care, 82956- Manual therapy, [phone]- Gait training, [phone]- Electrical stimulation (unattended), 97016- Vasopneumatic device, 97035- Ultrasound, 69629- Ionotophoresis 4mg /ml Dexamethasone , Patient/Family education, Balance training, Stair training, Taping, Dry Needling, Cryotherapy, and Moist heat  PLAN FOR NEXT SESSION: provide education on fall risk reduction; progress LE flexibility and strengthening; initiate balance training, may look at getting up from the floor   Samuella Crocker, PTA 12/21/2023, 10:45 AM   Date of referral: 10/27/23 Referring provider: Dorrene Gaucher, NP Referring diagnosis? R53.83 (ICD-10-CM) - Fatigue, unspecified type  Treatment diagnosis? (if different than referring diagnosis)  Muscle weakness (generalized)  Unsteadiness on feet  Difficulty in walking, not  elsewhere classified  History of falling  What was this (referring dx) caused by? Fall, Ongoing Issue, and Unspecified  Nature of Condition: Chronic (continuous duration > 3 months)   Laterality: Both  Current Functional Measure Score: Other ABC scale 1310 / 1600 = 81.9 %   Objective measurements identify impairments when they are compared to normal values, the uninvolved extremity, and prior level of function.  []  Yes  []  No  Objective assessment of functional ability: Moderate functional limitations   Briefly describe symptoms: Patient presents with physical impairments of impaired activity tolerance, impaired standing balance, impaired ambulation, and decreased safety awareness impacting safe and independent functional mobility.  Examination revealed increased risk for falls and functional decline as evidenced by the following objective test measures: 5xSTS of 17.03 sec (>15 sec indicates increased risk for falls and decreased BLE power). Further assessment of Berg, FGA and 6 minute walk test to be completed next visit.  How did symptoms start: gradual onset  Average pain intensity:  Last 24 hours: 0/10  Past week: 4-5/10  How often does the pt experience symptoms? Occasionally  How much have the symptoms interfered with usual daily activities? A little bit  How has condition changed since care began at this facility? NA - initial visit  In general, how is the patients overall health? Very Good  Onset date: ~1 year   BACK PAIN (STarT Back Screening Tool) - (When applicable): N/A  Has your back pain spread down your leg(s) at sometime in the last 2 weeks? []  Yes   []  No Have you had pain in the shoulder or neck at sometime in the past 2 weeks? []   Yes   []  No Have you only walked short distances because of your back pain? []  Yes   []  No In the past 2 weeks, have you dressed more slowly than usual because of your back pain? []  Yes   []  No Do you think it is not really  safe for person with a condition like yours to be physically active? []  Yes   []  No Have worrying thoughts been going through your mind a lot of the time? []  Yes   []  No Do you feel that your back pain is terrible and it is never going to get any better? []  Yes   []  No In general, have you stopped enjoying all the things you usually enjoy? []  Yes   []  No Overall, how bothersome has your back pain been in the last 2 weeks? []  Not at all   []  Slightly     []  Moderate   []  Very much     []  Extremely

## 2023-12-24 ENCOUNTER — Encounter: Payer: Self-pay | Admitting: Physical Therapy

## 2023-12-24 ENCOUNTER — Ambulatory Visit: Admitting: Physical Therapy

## 2023-12-24 DIAGNOSIS — M6281 Muscle weakness (generalized): Secondary | ICD-10-CM

## 2023-12-24 DIAGNOSIS — R262 Difficulty in walking, not elsewhere classified: Secondary | ICD-10-CM

## 2023-12-24 DIAGNOSIS — R2681 Unsteadiness on feet: Secondary | ICD-10-CM

## 2023-12-24 DIAGNOSIS — Z9181 History of falling: Secondary | ICD-10-CM

## 2023-12-24 NOTE — Therapy (Signed)
 OUTPATIENT PHYSICAL THERAPY TREATMENT   Patient Name: NANSI BIRMINGHAM MRN: [id number] DOB:[birth year], 83 y.o., female Today's Date: 12/24/2023   END OF SESSION:  PT End of Session - 12/24/23 1015     Visit Number 6    Date for PT Re-Evaluation 02/01/24    Authorization Type [employer]    Authorization Time Period 12/07/23 - 02/01/24    Authorization - Visit Number 6    Authorization - Number of Visits 8    Progress Note Due on Visit 10    PT Start Time 1015    PT Stop Time 1100    PT Time Calculation (min) 45 min    Activity Tolerance Patient tolerated treatment well    Behavior During Therapy [facility] for tasks assessed/performed              Past Medical History:  Diagnosis Date   Arthritis    Cataract    bilateral, has had removed   Colitis    Diverticulosis    History of blood transfusion    after birth of child   Hx of colonic polyps - adenomas 08/16/2002   Hyperlipidemia    Skin cancer    skin- On bridge of nose, basal cell carcinoma per patient   Past Surgical History:  Procedure Laterality Date   CATARACT EXTRACTION Bilateral 01/13/2019   COLONOSCOPY  last 05/12/2014   polyps removed 2015, 2020 TA x1   KNEE ARTHROSCOPY Left 06/2007   Dr. Jinger Mount   MOHS SURGERY     bridge of nose x 2 (unsure of dates ?2021 and 2018)   PARTIAL KNEE ARTHROPLASTY Right 08/27/2021   Procedure: UNICOMPARTMENTAL KNEE;  Surgeon: Osa Blase, MD;  Location: [facility];  Service: Orthopedics;  Laterality: Right;   TONSILLECTOMY     TOTAL KNEE ARTHROPLASTY Left 12/2008   Dr. Jinger Mount   Patient Active Problem List   Diagnosis Date Noted   Dyspnea on exertion 10/27/2023   Fatigue 10/27/2023   Preventative health care 10/27/2023   Skin cancer 09/17/2022   Urinary incontinence 09/17/2022   Cough 09/17/2022   S/P right unicompartmental knee replacement 08/27/2021   Osteoarthritis of right knee 08/06/2021   Pain of left hand 11/16/2019   Trigger finger of left hand 11/16/2019    Hyperlipidemia 09/04/2015   Hx of colonic polyps - adenomas 08/16/2002    PCP: Dorrene Gaucher, NP   REFERRING PROVIDER: Dorrene Gaucher, NP   REFERRING DIAG: R53.83 (ICD-10-CM) - Fatigue, unspecified type   C/o LE weakness, please evaluate for strength/balance.   THERAPY DIAG:  Muscle weakness (generalized)  Unsteadiness on feet  History of falling  Difficulty in walking, not elsewhere classified  RATIONALE FOR EVALUATION AND TREATMENT: Rehabilitation  ONSET DATE: up to 1 yr  NEXT MD VISIT: 11/15/2024   SUBJECTIVE:  SUBJECTIVE STATEMENT: Pt notes it is easier to balance on 1 foot when in shoes than bare feet.  Pt reports her [place] is [place] - every time she has to take the trash out or go for the mail she finds herself feeling weak and out of breath.  She volunteers at [employer] where she slid/fell out of a chair to the floor ~1 month ago and could not get up.  PAIN: Are you having pain? Yes: NPRS scale: 1-2/10   Pain location: L lateral knee  Pain description: occasional throbbing  Aggravating factors: unpredictable - sometimes sitting or prolonged standing but not consistent  Relieving factors: resolves on its own after 10-15 minutes   PERTINENT HISTORY:  Arthritis, R unicompartmental knee replacement 08/2021, L TKA 12/2008, colitis, diverticulosis, HLD, skin cancer, DOE, fatigue, urinary incontinence  PRECAUTIONS: Fall  RED FLAGS: None  WEIGHT BEARING RESTRICTIONS: No  FALLS:  Has patient fallen in last 6 months? Yes. Number of falls 1 - fall out of a chair/stool   LIVING ENVIRONMENT: Lives with: lives alone Lives in: House/apartment Stairs: Yes: External: 5-6 steps; on right going up, on left going up, and can reach both Has following equipment at  home: Single point cane, Walker - 2 wheeled, and Ramped entry  OCCUPATION: Retired  PLOF: Independent and Leisure: no regular exercise, Comptroller at [employer]   PATIENT GOALS: To be able t walk/move around confidently.   OBJECTIVE: (objective measures completed at initial evaluation unless otherwise dated)  DIAGNOSTIC FINDINGS:  N/A  COGNITION: Overall cognitive status: Within functional limits for tasks assessed   SENSATION: WFL  COORDINATION: LE gross motor WFL  POSTURE:  rounded shoulders, forward head, and flexed trunk   MUSCLE LENGTH: Hamstrings: mild/mod tight B ITB: mild/mod tight B Piriformis: mod tight B Hip flexors: mod tight B Quads: mild/mod tight B Heelcord: mild/mod tight B  LOWER EXTREMITY ROM:    B knee grossly 0-115  LOWER EXTREMITY MMT:    MMT Right eval Left eval  Hip flexion 4- 4-  Hip extension 4- 4-  Hip abduction 4- 4-  Hip adduction 4- 4-  Hip internal rotation 4- 4-  Hip external rotation 4- 4  Knee flexion 3+ 3+  Knee extension 4+ 4+  Ankle dorsiflexion 4 4  Ankle plantarflexion    Ankle inversion    Ankle eversion    (Blank rows = not tested)  BED MOBILITY:  Independent  TRANSFERS: Assistive device utilized: None  Sit to stand: Complete Independence Stand to sit: Complete Independence Chair to chair: Complete Independence Floor: NT  GAIT: Distance walked: Clinic distances Assistive device utilized: None Level of assistance: Complete Independence Gait pattern: WFL   FUNCTIONAL TESTS:  5 times sit to stand: 17.03 sec Timed up and go (TUG): 9.28 sec 6 minute walk test: see below (12/10/23) 10 meter walk test: 8.93 sec; Gait speed = 3.67 ft/sec Berg Balance Scale:  52/56 (12/10/23); 52-55/56 indicates lower (> 25%) risk for falls Functional gait assessment: 20/30 (12/10/23); 19-24/30 indicates medium risk for falls  12/10/23: 6 Minute Walk- Baseline  BP (mmHg) 162/80   HR (bpm) 77   02 Sat (%RA) 96 %   Modified Borg  Scale for Dyspnea 0- Nothing at all   Perceived Rate of Exertion (Borg) 6-   6 Minute walk- Post Test  BP (mmHg) 168/78   HR (bpm) 97   02 Sat (%RA) 97 %   Modified Borg Scale for Dyspnea 3- Moderate shortness of breath or  breathing difficulty   Perceived Rate of Exertion (Borg) 12-   6 minute walk test results   Aerobic Endurance Distance Walked 1144', Normal range for females 80-90: 730' - 1840'  Standardized Balance Assessment  Standardized Balance Assessment Berg Balance Test   Berg Balance Test  Sit to Stand Able to stand without using hands and stabilize independently   Standing Unsupported Able to stand safely 2 minutes   Sitting with Back Unsupported but Feet Supported on Floor or Stool Able to sit safely and securely 2 minutes   Stand to Sit Sits safely with minimal use of hands   Transfers Able to transfer safely, minor use of hands   Standing Unsupported with Eyes Closed Able to stand 10 seconds safely   Standing Unsupported with Feet Together Able to place feet together independently and stand 1 minute safely   From Standing, Reach Forward with Outstretched Arm Can reach forward >12 cm safely (5)   From Standing Position, Pick up Object from Floor Able to pick up shoe safely and easily   From Standing Position, Turn to Look Behind Over each Shoulder Looks behind from both sides and weight shifts well   Turn 360 Degrees Able to turn 360 degrees safely in 4 seconds or less   Standing Unsupported, Alternately Place Feet on Step/Stool Able to stand independently and safely and complete 8 steps in 20 seconds   Standing Unsupported, One Foot in Front Able to plae foot ahead of the other independently and hold 30 seconds   Standing on One Leg Able to lift leg independently and hold equal to or more than 3 seconds   Total Score 52   Berg comment: 52-55 lower (> 25%) risk for falls   Functional Gait  Assessment  Gait Level Surface Walks 20 ft in less than 7 sec but greater than 5.5  sec, uses assistive device, slower speed, mild gait deviations, or deviates 6-10 in outside of the 12 in walkway width.   Change in Gait Speed Able to smoothly change walking speed without loss of balance or gait deviation. Deviate no more than 6 in outside of the 12 in walkway width.   Gait with Horizontal Head Turns Performs head turns smoothly with slight change in gait velocity (eg, minor disruption to smooth gait path), deviates 6-10 in outside 12 in walkway width, or uses an assistive device.   Gait with Vertical Head Turns Performs task with slight change in gait velocity (eg, minor disruption to smooth gait path), deviates 6 - 10 in outside 12 in walkway width or uses assistive device   Gait and Pivot Turn Pivot turns safely within 3 sec and stops quickly with no loss of balance.   Step Over Obstacle Is able to step over 2 stacked shoe boxes taped together (9 in total height) without changing gait speed. No evidence of imbalance.   Gait with Narrow Base of Support Ambulates 4-7 steps.   Gait with Eyes Closed Walks 20 ft, slow speed, abnormal gait pattern, evidence for imbalance, deviates 10-15 in outside 12 in walkway width. Requires more than 9 sec to ambulate 20 ft.   Ambulating Backwards Walks 20 ft, slow speed, abnormal gait pattern, evidence for imbalance, deviates 10-15 in outside 12 in walkway width.   Steps Alternating feet, must use rail.   Total Score 20   FGA comment: 19-24 = medium risk fall     PATIENT SURVEYS:  ABC scale 1310 / 1600 = 81.9 %; >80% indicates a  high level of physical functioning   TODAY'S TREATMENT:   12/24/2023  THERAPEUTIC EXERCISE: To improve strength and endurance.  Demonstration, verbal and tactile cues throughout for technique.  Rec Bike - L2 x 6 min Standing HS curl with RTB at ankles x 15 bil  THERAPEUTIC ACTIVITIES: To improve functional performance.  Demonstration, verbal and tactile cues throughout for technique. Worked on floor to/from stand  transfers via 1/2 kneel on R (on Airex pad) with L foot forward, pushing up from R hand on floor and L hand on knee x 2 reps - increased effort necessary but pt verbalizing good understanding of technique  NEUROMUSCULAR RE-EDUCATION: To improve balance, coordination, kinesthesia, posture, proprioception, and reduce fall risk. SLS + 5-way (1/2 circle) toe tap to colored dots SLS + 2-way hip swing (flexion & extension) x 5 cycles w/o touching down, 2 pole A for balance  SLS + 3-way hip swing (flexion, ABD, extension) x 5 cycles w/o touching down, 2 pole A for balance  Alt toe clears with light toe tap to 9 step x 10 Alt toe clears with light toe tap to 9 step from standing on Airex pad x 10   12/21/23 Nustep level 5 x 6 minutes Supine HS stretch x 30 strap Supine piriformis stretch x30 Supine quad stretch x 30  Supine ITB stretch x 30: S/L clamshell RTB 15x3 B Standing:  Hip extension 3lb x 10 B  Hip abduction 3lb x 10 B  Marching x 10 3lb B  HS curls RTB 2 x 10 B Education on fall risk   12/18/23 Nustep level 5 x 5 minutes Supine passive HS, piriformis, adductor stretches Supine bridges Supine green tband clamshells Side stepping Cone toe touch  On airex balance, head turns, eyes closed, ball toss On airex cone toe touches Backward walking Side step on and off airex Direction changes Star with colored dots PT calling out colors and her stepping toward SLS x 5 seconds each Talked about getting up off the floor   12/16/2023 THERAPEUTIC EXERCISE: To improve strength, endurance, and flexibility.  Demonstration, verbal and tactile cues throughout for technique.  Rec Bike - L1 x 6 min Seated hamstring curls with looped RTB at ankles 2 x 10 bil S/L RTB clam 2 x 10 Hooklying L HS stretch with strap x 30 Supine L HS stretch with strap x 30 Supine L ITB stretch with strap x 30 Hooklying L KTOS piriformis with strap for added reach x 30 Mod thomas L quad/hip flexor  stretch with strap x 30  NEUROMUSCULAR RE-EDUCATION: To improve balance, coordination, kinesthesia, posture, and proprioception.  Standing alt hip abduction with looped RTB at thighs x 10 bil Standing alt hip abduction with looped RTB at ankles x 10 bil Standing alt hip extension with looped RTB at ankles x 10 bil Standing alt hip flexion march with looped RTB at midfeet x 10 bil   PATIENT EDUCATION:  Education details: HEP review and HEP update - LE stretching  Person educated: Patient Education method: Explanation, Demonstration, Tactile cues, Verbal cues, and Handouts Education comprehension: verbalized understanding, returned demonstration, verbal cues required, tactile cues required, and needs further education  HOME EXERCISE PROGRAM: Access Code: [email or web address] URL: [email or web address] Date: 12/21/2023 Prepared by: Braylin Clark  Exercises - Supine Hamstring Stretch with Strap  - 1-2 x daily - 7 x weekly - 3 reps - 30 sec hold - Supine Iliotibial Band Stretch with Strap  - 1-2 x daily - 7 x weekly - 3 reps -  30 sec hold - Supine Piriformis Stretch with Foot on Ground  - 1-2 x daily - 7 x weekly - 3 reps - 30 sec hold - Hip Flexor Stretch with Strap on Table  - 1-2 x daily - 7 x weekly - 3 reps - 30 sec hold - Clam with Resistance  - 1 x daily - 5-7 x weekly - 2 sets - 10 reps - 3-5 sec hold - Standing Hip Abduction with Resistance at Ankles and Counter Support  - 1 x daily - 5-7 x weekly - 2 sets - 10 reps - 3 sec hold - Standing Hip Extension with Resistance at Ankles and Counter Support  - 1 x daily - 5-7 x weekly - 2 sets - 10 reps - 3 sec hold - Marching with Resistance  - 1 x daily - 5-7 x weekly - 2 sets - 10 reps - 3 sec hold - Standing Hamstring Curl with Resistance  - 1 x daily - 5-7 x weekly - 2 sets - 10 reps  Patient Education - What You Can Do to Prevent Falls   ASSESSMENT:  CLINICAL IMPRESSION: Zhana reports HEP going well with standing  modification of HS curls working better than seated as all of her chairs have cross bars under the seat - STG #1 met.  She denies any questions regarding the fall prevention education.  We worked on fall recovery with floor to stand transfer w/o external support for UE using hands to push up from the floor in 1/2 kneel with L foot forward and R knee down d/t h/o L TKA - increased effort necessary with some unsteadiness upon rising but pt verbalizing understanding of the technique.  Continued progression of dynamic balance in SLS on variable surfaces to help improve stability with lower body dressing, but still requiring external support with some activities.  Berneice will benefit from continued skilled PT to address ongoing strength and balance deficits to improve mobility and activity tolerance with decreased pain interference and decreased risk for falls.   From EVAL: ALUEL SCHWARZ is a 83 y.o. female who was referred to physical therapy for evaluation and treatment for fatigue with associated LE weakness.  Patient presents with physical impairments of impaired activity tolerance, impaired standing balance, impaired ambulation, and decreased safety awareness impacting safe and independent functional mobility.  Examination revealed gait speed of 3.67 ft/sec, (2.62 ft/sec is needed for community access) and TUG of 9.28 sec (>13.5 sec indicates increased risk for falls) WNL however increased risk for falls and functional decline as evidenced by the following objective test measures:  5xSTS of 17.03 sec (>15 sec indicates increased risk for falls and decreased BLE power). Further assessment of Berg, FGA and 6 minute walk test to be completed next visit.  [place] will benefit from skilled PT to address above deficits to improve mobility and activity tolerance to help reach the maximal level of functional independence and mobility. Patient demonstrates understanding of this POC and is in agreement with this plan.    OBJECTIVE IMPAIRMENTS: Abnormal gait, decreased activity tolerance, decreased balance, decreased mobility, difficulty walking, decreased strength, decreased safety awareness, increased fascial restrictions, impaired perceived functional ability, increased muscle spasms, improper body mechanics, postural dysfunction, and pain.   ACTIVITY LIMITATIONS: carrying, lifting, bending, standing, squatting, stairs, transfers, and locomotion level  PARTICIPATION LIMITATIONS: meal prep, cleaning, laundry, shopping, and community activity  PERSONAL FACTORS: Age, Fitness, Past/current experiences, Time since onset of injury/illness/exacerbation, and 3+ comorbidities: Arthritis, R unicompartmental knee replacement  08/2021, L TKA 12/2008, colitis, diverticulosis, HLD, skin cancer, DOE, fatigue, urinary incontinence are also affecting patient's functional outcome.   REHAB POTENTIAL: Good  CLINICAL DECISION MAKING: Evolving/moderate complexity  EVALUATION COMPLEXITY: Moderate   GOALS: Goals reviewed with patient? Yes  SHORT TERM GOALS: Target date: 01/04/2024  Patient will be independent with initial HEP to improve outcomes and carryover.  Baseline: Initial HEP provided on eval 12/10/23 - Pt has not yet attempted initial HEP 12/16/23 - HEP reviewed and updated Goal status: MET - 12/24/23  2.  Patient will be educated on strategies to decrease risk of falls.  Baseline:  Goal status: MET - 12/21/23  3.  Patient will improve 5xSTS time to </= 15 seconds for improved efficiency and safety with transfers. Baseline: 17.03 sec Goal status: IN PROGRESS  LONG TERM GOALS: Target date: 02/01/2024  Patient will be independent with advanced/ongoing HEP to facilitate ability to maintain/progress functional gains from skilled physical therapy services. Baseline:  Goal status: IN PROGRESS  2.  Patient will be able to ambulate 600' with or w/o LRAD on variable surfaces including curb and stair negotiation with  single rail with good safety to access community.  Baseline:  Goal status: IN PROGRESS  3. Patient will demonstrate improved B LE strength to >/= 4 to 4+/5 for improved stability and ease of mobility . Baseline: Refer to above LE MMT table Goal status: IN PROGRESS  4.  Patient will improve Berg score by at least 8 points or to >/= 52/56 to improve safety and stability with ADLs in standing and reduce risk for falls. (MCID= 8 points)  Baseline: 52/26 (12/10/23) Goal status: MET  5. Patient will improve FGA score by at least 4 points or to at least 21/30 to improve gait stability and reduce risk for falls. Baseline: 20/30 (12/10/23) Goal status: IN PROGRESS   PLAN:  PT FREQUENCY: 2x/week  PT DURATION: 8 weeks  PLANNED INTERVENTIONS: 09811- PT Re-evaluation, 97750- Physical Performance Testing, 97110-Therapeutic exercises, 97530- Therapeutic activity, [id number]- Neuromuscular re-education, 97535- Self Care, 91478- Manual therapy, [phone]- Gait training, [phone]- Electrical stimulation (unattended), 97016- Vasopneumatic device, 97035- Ultrasound, [id number]- Ionotophoresis 4mg /ml Dexamethasone , Patient/Family education, Balance training, Stair training, Taping, Dry Needling, Cryotherapy, and Moist heat  PLAN FOR NEXT SESSION: reassess standardized balance testing (5xSTS, Berg & FGA) in upcoming visits; progress LE flexibility and strengthening; progress balance training, esp SLS; review floor transfers as requested by patient   Francisco Irving, PT 12/24/2023, 11:15 AM   Date of referral: 10/27/23 Referring provider: Dorrene Gaucher, NP Referring diagnosis? R53.83 (ICD-10-CM) - Fatigue, unspecified type  Treatment diagnosis? (if different than referring diagnosis)  Muscle weakness (generalized)  Unsteadiness on feet  History of falling  Difficulty in walking, not elsewhere classified  What was this (referring dx) caused by? Fall, Ongoing Issue, and Unspecified  Nature of Condition: Chronic  (continuous duration > 3 months)   Laterality: Both  Current Functional Measure Score: Other ABC scale 1310 / 1600 = 81.9 %   Objective measurements identify impairments when they are compared to normal values, the uninvolved extremity, and prior level of function.  [x]  Yes  []  No  Objective assessment of functional ability: Moderate functional limitations   Briefly describe symptoms: Patient presents with physical impairments of impaired activity tolerance, impaired standing balance, impaired ambulation, and decreased safety awareness impacting safe and independent functional mobility.  Examination revealed increased risk for falls and functional decline as evidenced by the following objective test measures: 5xSTS of 17.03 sec (>15 sec  indicates increased risk for falls and decreased BLE power). Further assessment of Berg, FGA and 6 minute walk test to be completed next visit.  How did symptoms start: gradual onset  Average pain intensity:  Last 24 hours: 0/10  Past week: 4-5/10  How often does the pt experience symptoms? Occasionally  How much have the symptoms interfered with usual daily activities? A little bit  How has condition changed since care began at this facility? NA - initial visit  In general, how is the patients overall health? Very Good  Onset date: ~1 year   BACK PAIN (STarT Back Screening Tool) - (When applicable): N/A  Has your back pain spread down your leg(s) at sometime in the last 2 weeks? []  Yes   []  No Have you had pain in the shoulder or neck at sometime in the past 2 weeks? []  Yes   []  No Have you only walked short distances because of your back pain? []  Yes   []  No In the past 2 weeks, have you dressed more slowly than usual because of your back pain? []  Yes   []  No Do you think it is not really safe for person with a condition like yours to be physically active? []  Yes   []  No Have worrying thoughts been going through your mind a lot of the  time? []  Yes   []  No Do you feel that your back pain is terrible and it is never going to get any better? []  Yes   []  No In general, have you stopped enjoying all the things you usually enjoy? []  Yes   []  No Overall, how bothersome has your back pain been in the last 2 weeks? []  Not at all   []  Slightly     []  Moderate   []  Very much     []  Extremely

## 2023-12-28 ENCOUNTER — Encounter: Payer: Self-pay | Admitting: Physical Therapy

## 2023-12-28 ENCOUNTER — Ambulatory Visit: Admitting: Physical Therapy

## 2023-12-28 DIAGNOSIS — M6281 Muscle weakness (generalized): Secondary | ICD-10-CM | POA: Diagnosis not present

## 2023-12-28 DIAGNOSIS — R2681 Unsteadiness on feet: Secondary | ICD-10-CM

## 2023-12-28 DIAGNOSIS — R262 Difficulty in walking, not elsewhere classified: Secondary | ICD-10-CM

## 2023-12-28 DIAGNOSIS — Z9181 History of falling: Secondary | ICD-10-CM

## 2023-12-28 NOTE — Therapy (Signed)
 OUTPATIENT PHYSICAL THERAPY TREATMENT   Patient Name: Angelica Davis MRN: [id number] DOB:[birth year], 83 y.o., female Today's Date: 12/28/2023   END OF SESSION:  PT End of Session - 12/28/23 0930     Visit Number 7    Date for PT Re-Evaluation 02/01/24    Authorization Type [employer] Medicare    Authorization Time Period 12/07/23 - 02/01/24    Authorization - Visit Number 7    Authorization - Number of Visits 8    Progress Note Due on Visit 10    PT Start Time 0930    PT Stop Time 1016    PT Time Calculation (min) 46 min    Activity Tolerance Patient tolerated treatment well    Behavior During Therapy [facility] for tasks assessed/performed               Past Medical History:  Diagnosis Date   Arthritis    Cataract    bilateral, has had removed   Colitis    Diverticulosis    History of blood transfusion    after birth of child   Hx of colonic polyps - adenomas 08/16/2002   Hyperlipidemia    Skin cancer    skin- On bridge of nose, basal cell carcinoma per patient   Past Surgical History:  Procedure Laterality Date   CATARACT EXTRACTION Bilateral 01/13/2019   COLONOSCOPY  last 05/12/2014   polyps removed 2015, 2020 TA x1   KNEE ARTHROSCOPY Left 06/2007   Dr. Jane   MOHS SURGERY     bridge of nose x 2 (unsure of dates ?2021 and 2018)   PARTIAL KNEE ARTHROPLASTY Right 08/27/2021   Procedure: UNICOMPARTMENTAL KNEE;  Surgeon: Josefina Chew, MD;  Location: [facility];  Service: Orthopedics;  Laterality: Right;   TONSILLECTOMY     TOTAL KNEE ARTHROPLASTY Left 12/2008   Dr. Jane   Patient Active Problem List   Diagnosis Date Noted   Dyspnea on exertion 10/27/2023   Fatigue 10/27/2023   Preventative health care 10/27/2023   Skin cancer 09/17/2022   Urinary incontinence 09/17/2022   Cough 09/17/2022   S/P right unicompartmental knee replacement 08/27/2021   Osteoarthritis of right knee 08/06/2021   Pain of left hand 11/16/2019   Trigger finger of left hand 11/16/2019    Hyperlipidemia 09/04/2015   Hx of colonic polyps - adenomas 08/16/2002    PCP: Daryl Setter, NP   REFERRING PROVIDER: Daryl Setter, NP   REFERRING DIAG: R53.83 (ICD-10-CM) - Fatigue, unspecified type   C/o LE weakness, please evaluate for strength/balance.   THERAPY DIAG:  Muscle weakness (generalized)  Unsteadiness on feet  History of falling  Difficulty in walking, not elsewhere classified  RATIONALE FOR EVALUATION AND TREATMENT: Rehabilitation  ONSET DATE: up to 1 yr  NEXT MD VISIT: 11/15/2024   SUBJECTIVE:  SUBJECTIVE STATEMENT: Pt still notes occasional discomfort in L knee at certain times - I.e. warm-up on recumbent bike.  Pt reports her house is [place] a hill - every time she has to take the trash out or go for the mail she finds herself feeling weak and out of breath.  She volunteers at [employer] where she slid/fell out of a chair to the floor ~1 month ago and could not get up.  PAIN: Are you having pain? Yes: NPRS scale: 1-2/10   Pain location: L lateral knee  Pain description: occasional throbbing  Aggravating factors: unpredictable - sometimes sitting or prolonged standing but not consistent  Relieving factors: resolves on its own after 10-15 minutes   PERTINENT HISTORY:  Arthritis, R unicompartmental knee replacement 08/2021, L TKA 12/2008, colitis, diverticulosis, HLD, skin cancer, DOE, fatigue, urinary incontinence  PRECAUTIONS: Fall  RED FLAGS: None  WEIGHT BEARING RESTRICTIONS: No  FALLS:  Has patient fallen in last 6 months? Yes. Number of falls 1 - fall out of a chair/stool   LIVING ENVIRONMENT: Lives with: lives alone Lives in: House/apartment Stairs: Yes: External: 5-6 steps; on right going up, on left going up, and can reach both Has  following equipment at home: Single point cane, Walker - 2 wheeled, and Ramped entry  OCCUPATION: Retired  PLOF: Independent and Leisure: no regular exercise, Comptroller at [employer]   PATIENT GOALS: To be able t walk/move around confidently.   OBJECTIVE: (objective measures completed at initial evaluation unless otherwise dated)  DIAGNOSTIC FINDINGS:  N/A  COGNITION: Overall cognitive status: Within functional limits for tasks assessed   SENSATION: WFL  COORDINATION: LE gross motor WFL  POSTURE:  rounded shoulders, forward head, and flexed trunk   MUSCLE LENGTH: Hamstrings: mild/mod tight B ITB: mild/mod tight B Piriformis: mod tight B Hip flexors: mod tight B Quads: mild/mod tight B Heelcord: mild/mod tight B  LOWER EXTREMITY ROM:    B knee grossly 0-115  LOWER EXTREMITY MMT:    MMT Right eval Left eval  Hip flexion 4- 4-  Hip extension 4- 4-  Hip abduction 4- 4-  Hip adduction 4- 4-  Hip internal rotation 4- 4-  Hip external rotation 4- 4  Knee flexion 3+ 3+  Knee extension 4+ 4+  Ankle dorsiflexion 4 4  Ankle plantarflexion    Ankle inversion    Ankle eversion    (Blank rows = not tested)  BED MOBILITY:  Independent  TRANSFERS: Assistive device utilized: None  Sit to stand: Complete Independence Stand to sit: Complete Independence Chair to chair: Complete Independence Floor: NT  GAIT: Distance walked: Clinic distances Assistive device utilized: None Level of assistance: Complete Independence Gait pattern: WFL   FUNCTIONAL TESTS:  5 times sit to stand: 17.03 sec Timed up and go (TUG): 9.28 sec 6 minute walk test: see below (12/10/23) 10 meter walk test: 8.93 sec; Gait speed = 3.67 ft/sec Berg Balance Scale:  52/56 (12/10/23); 52-55/56 indicates lower (> 25%) risk for falls Functional gait assessment: 20/30 (12/10/23); 19-24/30 indicates medium risk for falls  12/10/23: 6 Minute Walk- Baseline  BP (mmHg) 162/80   HR (bpm) 77   02 Sat (%RA)  96 %   Modified Borg Scale for Dyspnea 0- Nothing at all   Perceived Rate of Exertion (Borg) 6-   6 Minute walk- Post Test  BP (mmHg) 168/78   HR (bpm) 97   02 Sat (%RA) 97 %   Modified Borg Scale for Dyspnea 3- Moderate shortness of breath  or breathing difficulty   Perceived Rate of Exertion (Borg) 12-   6 minute walk test results   Aerobic Endurance Distance Walked 1144', Normal range for females 80-90: 730' - 1840'  Standardized Balance Assessment  Standardized Balance Assessment Berg Balance Test   Berg Balance Test  Sit to Stand Able to stand without using hands and stabilize independently   Standing Unsupported Able to stand safely 2 minutes   Sitting with Back Unsupported but Feet Supported on Floor or Stool Able to sit safely and securely 2 minutes   Stand to Sit Sits safely with minimal use of hands   Transfers Able to transfer safely, minor use of hands   Standing Unsupported with Eyes Closed Able to stand 10 seconds safely   Standing Unsupported with Feet Together Able to place feet together independently and stand 1 minute safely   From Standing, Reach Forward with Outstretched Arm Can reach forward >12 cm safely (5)   From Standing Position, Pick up Object from Floor Able to pick up shoe safely and easily   From Standing Position, Turn to Look Behind Over each Shoulder Looks behind from both sides and weight shifts well   Turn 360 Degrees Able to turn 360 degrees safely in 4 seconds or less   Standing Unsupported, Alternately Place Feet on Step/Stool Able to stand independently and safely and complete 8 steps in 20 seconds   Standing Unsupported, One Foot in Front Able to plae foot ahead of the other independently and hold 30 seconds   Standing on One Leg Able to lift leg independently and hold equal to or more than 3 seconds   Total Score 52   Berg comment: 52-55 lower (> 25%) risk for falls   Functional Gait  Assessment  Gait Level Surface Walks 20 ft in less than 7 sec  but greater than 5.5 sec, uses assistive device, slower speed, mild gait deviations, or deviates 6-10 in outside of the 12 in walkway width.   Change in Gait Speed Able to smoothly change walking speed without loss of balance or gait deviation. Deviate no more than 6 in outside of the 12 in walkway width.   Gait with Horizontal Head Turns Performs head turns smoothly with slight change in gait velocity (eg, minor disruption to smooth gait path), deviates 6-10 in outside 12 in walkway width, or uses an assistive device.   Gait with Vertical Head Turns Performs task with slight change in gait velocity (eg, minor disruption to smooth gait path), deviates 6 - 10 in outside 12 in walkway width or uses assistive device   Gait and Pivot Turn Pivot turns safely within 3 sec and stops quickly with no loss of balance.   Step Over Obstacle Is able to step over 2 stacked shoe boxes taped together (9 in total height) without changing gait speed. No evidence of imbalance.   Gait with Narrow Base of Support Ambulates 4-7 steps.   Gait with Eyes Closed Walks 20 ft, slow speed, abnormal gait pattern, evidence for imbalance, deviates 10-15 in outside 12 in walkway width. Requires more than 9 sec to ambulate 20 ft.   Ambulating Backwards Walks 20 ft, slow speed, abnormal gait pattern, evidence for imbalance, deviates 10-15 in outside 12 in walkway width.   Steps Alternating feet, must use rail.   Total Score 20   FGA comment: 19-24 = medium risk fall    12/28/23: 5xSTS = 11.00 sec, <15 sec indicates decreased risk for recurrent falls FGA =  23/30, 19-24 = medium risk fall    PATIENT SURVEYS:  ABC scale 1310 / 1600 = 81.9 %; >80% indicates a high level of physical functioning   TODAY'S TREATMENT:   12/28/2023  THERAPEUTIC EXERCISE: To improve strength and endurance.  Demonstration, verbal and tactile cues throughout for technique. Rec Bike - L3 x 6 min  PHYSICAL PERFORMANCE TEST or MEASUREMENT: 5xSTS = 11.00  sec, <15 sec indicates decreased risk for recurrent falls FGA = 23/30, 19-24 = medium risk fall  Functional Gait  Assessment  Gait Level Surface Walks 20 ft in less than 7 sec but greater than 5.5 sec, uses assistive device, slower speed, mild gait deviations, or deviates 6-10 in outside of the 12 in walkway width.   Change in Gait Speed Able to smoothly change walking speed without loss of balance or gait deviation. Deviate no more than 6 in outside of the 12 in walkway width.   Gait with Horizontal Head Turns Performs head turns smoothly with slight change in gait velocity (eg, minor disruption to smooth gait path), deviates 6-10 in outside 12 in walkway width, or uses an assistive device.   Gait with Vertical Head Turns Performs head turns with no change in gait. Deviates no more than 6 in outside 12 in walkway width.   Gait and Pivot Turn Pivot turns safely within 3 sec and stops quickly with no loss of balance.   Step Over Obstacle Is able to step over one shoe box (4.5 in total height) without changing gait speed. No evidence of imbalance.   Gait with Narrow Base of Support Ambulates 7-9 steps.   Gait with Eyes Closed Walks 20 ft, uses assistive device, slower speed, mild gait deviations, deviates 6-10 in outside 12 in walkway width. Ambulates 20 ft in less than 9 sec but greater than 7 sec.   Ambulating Backwards Walks 20 ft, uses assistive device, slower speed, mild gait deviations, deviates 6-10 in outside 12 in walkway width.   Steps Alternating feet, must use rail.   Total Score 23   FGA comment: 19-24 = medium risk fall       THERAPEUTIC ACTIVITIES: To improve functional performance.  Demonstration, verbal and tactile cues throughout for technique. Worked on floor to/from stand transfers via 1/2 kneel on R (on Airex pad) with L foot forward, pushing up from R hand on floor and L hand on knee x 1 rep Fwd step-up to 6 step x 10 B Fwd step-up to 8 step x 10 B  NEUROMUSCULAR  RE-EDUCATION: To improve balance, coordination, kinesthesia, posture, proprioception, and reduce fall risk. Alt toe clears with light toe tap to 9 step x 20 SLS + 5-way (1/2 circle) toe tap to colored dots SLS + 3-way hip swing (flexion, ABD, extension) x 3 cycles w/o touching down, 1 pole A for balance    12/24/2023  THERAPEUTIC EXERCISE: To improve strength and endurance.  Demonstration, verbal and tactile cues throughout for technique.  Rec Bike - L2 x 6 min Standing HS curl with RTB at ankles x 15 bil  THERAPEUTIC ACTIVITIES: To improve functional performance.  Demonstration, verbal and tactile cues throughout for technique. Worked on floor to/from stand transfers via 1/2 kneel on R (on Airex pad) with L foot forward, pushing up from R hand on floor and L hand on knee x 2 reps - increased effort necessary but pt verbalizing good understanding of technique  NEUROMUSCULAR RE-EDUCATION: To improve balance, coordination, kinesthesia, posture, proprioception, and reduce fall risk.  SLS + 5-way (1/2 circle) toe tap to colored dots SLS + 2-way hip swing (flexion & extension) x 5 cycles w/o touching down, 2 pole A for balance  SLS + 3-way hip swing (flexion, ABD, extension) x 5 cycles w/o touching down, 2 pole A for balance  Alt toe clears with light toe tap to 9 step x 10 Alt toe clears with light toe tap to 9 step from standing on Airex pad x 10   12/21/23 Nustep level 5 x 6 minutes Supine HS stretch x 30 strap Supine piriformis stretch x30 Supine quad stretch x 30  Supine ITB stretch x 30: S/L clamshell RTB 15x3 B Standing:  Hip extension 3lb x 10 B  Hip abduction 3lb x 10 B  Marching x 10 3lb B  HS curls RTB 2 x 10 B Education on fall risk   12/18/23 Nustep level 5 x 5 minutes Supine passive HS, piriformis, adductor stretches Supine bridges Supine green tband clamshells Side stepping Cone toe touch  On airex balance, head turns, eyes closed, ball toss On airex cone toe  touches Backward walking Side step on and off airex Direction changes Star with colored dots PT calling out colors and her stepping toward SLS x 5 seconds each Talked about getting up off the floor   12/16/2023 THERAPEUTIC EXERCISE: To improve strength, endurance, and flexibility.  Demonstration, verbal and tactile cues throughout for technique.  Rec Bike - L1 x 6 min Seated hamstring curls with looped RTB at ankles 2 x 10 bil S/L RTB clam 2 x 10 Hooklying L HS stretch with strap x 30 Supine L HS stretch with strap x 30 Supine L ITB stretch with strap x 30 Hooklying L KTOS piriformis with strap for added reach x 30 Mod thomas L quad/hip flexor stretch with strap x 30  NEUROMUSCULAR RE-EDUCATION: To improve balance, coordination, kinesthesia, posture, and proprioception.  Standing alt hip abduction with looped RTB at thighs x 10 bil Standing alt hip abduction with looped RTB at ankles x 10 bil Standing alt hip extension with looped RTB at ankles x 10 bil Standing alt hip flexion march with looped RTB at midfeet x 10 bil   PATIENT EDUCATION:  Education details: HEP review and HEP update - LE stretching  Person educated: Patient Education method: Explanation, Demonstration, Tactile cues, Verbal cues, and Handouts Education comprehension: verbalized understanding, returned demonstration, verbal cues required, tactile cues required, and needs further education  HOME EXERCISE PROGRAM: Access Code: [email or web address] URL: [email or web address] Date: 12/21/2023 Prepared by: Braylin Clark  Exercises - Supine Hamstring Stretch with Strap  - 1-2 x daily - 7 x weekly - 3 reps - 30 sec hold - Supine Iliotibial Band Stretch with Strap  - 1-2 x daily - 7 x weekly - 3 reps - 30 sec hold - Supine Piriformis Stretch with Foot on Ground  - 1-2 x daily - 7 x weekly - 3 reps - 30 sec hold - Hip Flexor Stretch with Strap on Table  - 1-2 x daily - 7 x weekly - 3 reps - 30 sec hold - Clam  with Resistance  - 1 x daily - 5-7 x weekly - 2 sets - 10 reps - 3-5 sec hold - Standing Hip Abduction with Resistance at Ankles and Counter Support  - 1 x daily - 5-7 x weekly - 2 sets - 10 reps - 3 sec hold - Standing Hip Extension with Resistance at Ankles and Counter Support  - 1 x daily -  5-7 x weekly - 2 sets - 10 reps - 3 sec hold - Marching with Resistance  - 1 x daily - 5-7 x weekly - 2 sets - 10 reps - 3 sec hold - Standing Hamstring Curl with Resistance  - 1 x daily - 5-7 x weekly - 2 sets - 10 reps  Patient Education - What You Can Do to Prevent Falls   ASSESSMENT:  CLINICAL IMPRESSION: Angelica Davis reports she did not try getting up form the floor by herself as she was still unsure about this, therefore practiced this again today with improving stability.  Reassessed FGA with 3 point gain observed but pt noted to catch her toes on the stair assessment.  She notes she has this same trouble at home, therefore continued to work on toe clears, progressing to 6 and 8 step ups to work on increased hip and knee flexion for improved foot clearance.  Continued progression of dynamic balance in SLS to help improve stability with lower body dressing, but still requiring external support with some activities.  Angelica Davis is demonstrating progress toward her PT goals (all STGs now met) and will benefit from continued skilled PT to address ongoing strength and balance deficits to improve mobility and activity tolerance with decreased pain interference and decreased risk for falls.   From EVAL: Angelica Davis is a 83 y.o. female who was referred to physical therapy for evaluation and treatment for fatigue with associated LE weakness.  Patient presents with physical impairments of impaired activity tolerance, impaired standing balance, impaired ambulation, and decreased safety awareness impacting safe and independent functional mobility.  Examination revealed gait speed of 3.67 ft/sec, (2.62 ft/sec is needed for  community access) and TUG of 9.28 sec (>13.5 sec indicates increased risk for falls) WNL however increased risk for falls and functional decline as evidenced by the following objective test measures:  5xSTS of 17.03 sec (>15 sec indicates increased risk for falls and decreased BLE power). Further assessment of Berg, FGA and 6 minute walk test to be completed next visit.  Angelica Davis will benefit from skilled PT to address above deficits to improve mobility and activity tolerance to help reach the maximal level of functional independence and mobility. Patient demonstrates understanding of this POC and is in agreement with this plan.   OBJECTIVE IMPAIRMENTS: Abnormal gait, decreased activity tolerance, decreased balance, decreased mobility, difficulty walking, decreased strength, decreased safety awareness, increased fascial restrictions, impaired perceived functional ability, increased muscle spasms, improper body mechanics, postural dysfunction, and pain.   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   REHAB POTENTIAL: Good  CLINICAL DECISION MAKING: Evolving/moderate complexity  EVALUATION COMPLEXITY: Moderate   GOALS: Goals reviewed with patient? Yes  SHORT TERM GOALS: Target date: 01/04/2024  Patient will be independent with initial HEP to improve outcomes and carryover.  Baseline: Initial HEP provided on eval 12/10/23 - Pt has not yet attempted initial HEP 12/16/23 - HEP reviewed and updated Goal status: MET -  12/24/23  2.  Patient will be educated on strategies to decrease risk of falls.  Baseline:  Goal status: MET - 12/21/23  3.  Patient will improve 5xSTS time to </= 15 seconds for  improved efficiency and safety with transfers. Baseline: 17.03 sec Goal status: MET - 12/28/23 - 11.00 sec  LONG TERM GOALS: Target date: 02/01/2024  Patient will be independent with advanced/ongoing HEP to facilitate ability to maintain/progress functional gains from skilled physical therapy services. Baseline:  Goal status: IN PROGRESS  2.  Patient will be able to ambulate 600' with or w/o LRAD on variable surfaces including curb and stair negotiation with single rail with good safety to access community.  Baseline:  Goal status: IN PROGRESS  3. Patient will demonstrate improved B LE strength to >/= 4 to 4+/5 for improved stability and ease of mobility . Baseline: Refer to above LE MMT table Goal status: IN PROGRESS  4.  Patient will improve Berg score by at least 8 points or to >/= 52/56 to improve safety and stability with ADLs in standing and reduce risk for falls. (MCID= 8 points)  Baseline: 52/26 (12/10/23) Goal status: MET - 12/10/23  5. Patient will improve FGA score by at least 4 points or to at least 21/30 to improve gait stability and reduce risk for falls. Baseline: 20/30 (12/10/23) Goal status: IN PROGRESS - 12/28/23 - 23/30   PLAN:  PT FREQUENCY: 2x/week  PT DURATION: 8 weeks  PLANNED INTERVENTIONS: 02835- PT Re-evaluation, 97750- Physical Performance Testing, 97110-Therapeutic exercises, 97530- Therapeutic activity, 97112- Neuromuscular re-education, 97535- Self Care, 02859- Manual therapy, [phone]- Gait training, [phone]- Electrical stimulation (unattended), [id number]- Vasopneumatic device, 97035- Ultrasound, 02966- Ionotophoresis 4mg /ml Dexamethasone , Patient/Family education, Balance training, Stair training, Taping, Dry Needling, Cryotherapy, and Moist heat  PLAN FOR NEXT SESSION: [employer] Medicare  reauthorization (update smart phrase); reassess ABD scale & LE MMT; progress LE flexibility and strengthening; progress balance training, esp SLS; review floor transfers as requested by patient   Elijah CHRISTELLA Hidden, PT 12/28/2023, 10:22 AM   Date of referral: 10/27/23 Referring provider: Daryl Setter, NP Referring diagnosis? R53.83 (ICD-10-CM) - Fatigue, unspecified type  Treatment diagnosis? (if different than referring diagnosis)  Muscle weakness (generalized)  Unsteadiness on feet  History of falling  Difficulty in walking, not elsewhere classified  What was this (referring dx) caused by? Fall, Ongoing Issue, and Unspecified  Nature of Condition: Chronic (continuous duration > 3 months)   Laterality: Both  Current Functional Measure Score: Other ABC scale 1310 / 1600 = 81.9 %   Objective measurements identify impairments when they are compared to normal values, the uninvolved extremity, and prior level of function.  [x]  Yes  []  No  Objective assessment of functional ability: Moderate functional limitations   Briefly describe symptoms: Patient presents with physical impairments of impaired activity tolerance, impaired standing balance, impaired ambulation, and decreased safety awareness impacting safe and independent functional mobility.  Examination revealed increased risk for falls and functional decline as evidenced by the following objective test measures: 5xSTS of 17.03 sec (>15 sec indicates increased risk for falls and decreased BLE power). Further assessment of Berg, FGA and 6 minute walk test to be completed next visit.  How did symptoms start: gradual onset  Average pain intensity:  Last 24 hours: 0/10  Past week: 4-5/10  How often does the pt experience symptoms? Occasionally  How much have the symptoms interfered with usual daily activities? A little bit  How has condition changed since care began at this facility? NA - initial visit  In general, how is  the patients overall health? Very Good  Onset date: ~1 year   BACK PAIN (STarT Back Screening Tool) - (When applicable): N/A  Has your  back pain spread down your leg(s) at sometime in the last 2 weeks? []  Yes   []  No Have you had pain in the shoulder or neck at sometime in the past 2 weeks? []  Yes   []  No Have you only walked short distances because of your back pain? []  Yes   []  No In the past 2 weeks, have you dressed more slowly than usual because of your back pain? []  Yes   []  No Do you think it is not really safe for person with a condition like yours to be physically active? []  Yes   []  No Have worrying thoughts been going through your mind a lot of the time? []  Yes   []  No Do you feel that your back pain is terrible and it is never going to get any better? []  Yes   []  No In general, have you stopped enjoying all the things you usually enjoy? []  Yes   []  No Overall, how bothersome has your back pain been in the last 2 weeks? []  Not at all   []  Slightly     []  Moderate   []  Very much     []  Extremely

## 2023-12-29 ENCOUNTER — Ambulatory Visit (HOSPITAL_BASED_OUTPATIENT_CLINIC_OR_DEPARTMENT_OTHER)
Admission: RE | Admit: 2023-12-29 | Discharge: 2023-12-29 | Disposition: A | Discharge status: Home / Self Care | Source: Ambulatory Visit | Attending: Family | Admitting: Family

## 2023-12-29 DIAGNOSIS — Z780 Asymptomatic menopausal state: Secondary | ICD-10-CM | POA: Insufficient documentation

## 2023-12-30 ENCOUNTER — Ambulatory Visit: Payer: Self-pay | Admitting: Family

## 2023-12-31 ENCOUNTER — Ambulatory Visit

## 2023-12-31 DIAGNOSIS — M6281 Muscle weakness (generalized): Secondary | ICD-10-CM | POA: Diagnosis not present

## 2023-12-31 DIAGNOSIS — R262 Difficulty in walking, not elsewhere classified: Secondary | ICD-10-CM

## 2023-12-31 DIAGNOSIS — Z9181 History of falling: Secondary | ICD-10-CM

## 2023-12-31 DIAGNOSIS — R2681 Unsteadiness on feet: Secondary | ICD-10-CM

## 2023-12-31 NOTE — Therapy (Signed)
 OUTPATIENT PHYSICAL THERAPY TREATMENT   Patient Name: NEEMA BARREIRA MRN: [id number] DOB:1941-05-15, 83 y.o., female Today's Date: 12/31/2023   END OF SESSION:  PT End of Session - 12/31/23 0937     Visit Number 8    Date for PT Re-Evaluation 02/01/24    Authorization Type [employer] Medicare    Authorization Time Period 12/07/23 - 02/01/24    Authorization - Visit Number 8    Authorization - Number of Visits 8    Progress Note Due on Visit 10    PT Start Time 0931    PT Stop Time 1015    PT Time Calculation (min) 44 min    Activity Tolerance Patient tolerated treatment well    Behavior During Therapy [facility] for tasks assessed/performed               Past Medical History:  Diagnosis Date   Arthritis    Cataract    bilateral, has had removed   Colitis    Diverticulosis    History of blood transfusion    after birth of child   Hx of colonic polyps - adenomas 08/16/2002   Hyperlipidemia    Skin cancer    skin- On bridge of nose, basal cell carcinoma per patient   Past Surgical History:  Procedure Laterality Date   CATARACT EXTRACTION Bilateral 01/13/2019   COLONOSCOPY  last 05/12/2014   polyps removed 2015, 2020 TA x1   KNEE ARTHROSCOPY Left 06/2007   Dr. Jane   MOHS SURGERY     bridge of nose x 2 (unsure of dates ?2021 and 2018)   PARTIAL KNEE ARTHROPLASTY Right 08/27/2021   Procedure: UNICOMPARTMENTAL KNEE;  Surgeon: Josefina Chew, MD;  Location: [facility];  Service: Orthopedics;  Laterality: Right;   TONSILLECTOMY     TOTAL KNEE ARTHROPLASTY Left 12/2008   Dr. Jane   Patient Active Problem List   Diagnosis Date Noted   Dyspnea on exertion 10/27/2023   Fatigue 10/27/2023   Preventative health care 10/27/2023   Skin cancer 09/17/2022   Urinary incontinence 09/17/2022   Cough 09/17/2022   S/P right unicompartmental knee replacement 08/27/2021   Osteoarthritis of right knee 08/06/2021   Pain of left hand 11/16/2019   Trigger finger of left hand 11/16/2019    Hyperlipidemia 09/04/2015   Hx of colonic polyps - adenomas 08/16/2002    PCP: Daryl Setter, NP   REFERRING PROVIDER: Daryl Setter, NP   REFERRING DIAG: R53.83 (ICD-10-CM) - Fatigue, unspecified type   C/o LE weakness, please evaluate for strength/balance.   THERAPY DIAG:  Muscle weakness (generalized)  Unsteadiness on feet  History of falling  Difficulty in walking, not elsewhere classified  RATIONALE FOR EVALUATION AND TREATMENT: Rehabilitation  ONSET DATE: up to 1 yr  NEXT MD VISIT: 11/15/2024   SUBJECTIVE:  SUBJECTIVE STATEMENT: Pt. States feels a little slower moving, but denies any pain, denies any falls.  Pt reports her [place] is [place] - every time she has to take the trash out or go for the mail she finds herself feeling weak and out of breath.  She volunteers at [employer] where she slid/fell out of a chair to the floor ~1 month ago and could not get up.  PAIN: Are you having pain? Yes: NPRS scale: 1-2/10   Pain location: L lateral knee  Pain description: occasional throbbing  Aggravating factors: unpredictable - sometimes sitting or prolonged standing but not consistent  Relieving factors: resolves on its own after 10-15 minutes   PERTINENT HISTORY:  Arthritis, R unicompartmental knee replacement 08/2021, L TKA 12/2008, colitis, diverticulosis, HLD, skin cancer, DOE, fatigue, urinary incontinence  PRECAUTIONS: Fall  RED FLAGS: None  WEIGHT BEARING RESTRICTIONS: No  FALLS:  Has patient fallen in last 6 months? Yes. Number of falls 1 - fall out of a chair/stool   LIVING ENVIRONMENT: Lives with: lives alone Lives in: House/apartment Stairs: Yes: External: 5-6 steps; on right going up, on left going up, and can reach both Has following  equipment at home: Single point cane, Walker - 2 wheeled, and Ramped entry  OCCUPATION: Retired  PLOF: Independent and Leisure: no regular exercise, Comptroller at [employer]   PATIENT GOALS: To be able t walk/move around confidently.   OBJECTIVE: (objective measures completed at initial evaluation unless otherwise dated)  DIAGNOSTIC FINDINGS:  N/A  COGNITION: Overall cognitive status: Within functional limits for tasks assessed   SENSATION: WFL  COORDINATION: LE gross motor WFL  POSTURE:  rounded shoulders, forward head, and flexed trunk   MUSCLE LENGTH: Hamstrings: mild/mod tight B ITB: mild/mod tight B Piriformis: mod tight B Hip flexors: mod tight B Quads: mild/mod tight B Heelcord: mild/mod tight B  LOWER EXTREMITY ROM:    B knee grossly 0-115  LOWER EXTREMITY MMT:    MMT Right eval Left eval R 12/31/23 L 12/31/23  Hip flexion 4- 4- 4 4+  Hip extension 4- 4- 4- 4+  Hip abduction 4- 4- 4 4+  Hip adduction 4- 4- 5 4+  Hip internal rotation 4- 4- 4 4+  Hip external rotation 4- 4 4- 4  Knee flexion 3+ 3+ 4+ 4+  Knee extension 4+ 4+ 4+ 4  Ankle dorsiflexion 4 4 5 5   Ankle plantarflexion      Ankle inversion      Ankle eversion      (Blank rows = not tested)  BED MOBILITY:  Independent  TRANSFERS: Assistive device utilized: None  Sit to stand: Complete Independence Stand to sit: Complete Independence Chair to chair: Complete Independence Floor: NT  GAIT: Distance walked: Clinic distances Assistive device utilized: None Level of assistance: Complete Independence Gait pattern: WFL   FUNCTIONAL TESTS:  5 times sit to stand: 17.03 sec Timed up and go (TUG): 9.28 sec 6 minute walk test: see below (12/10/23) 10 meter walk test: 8.93 sec; Gait speed = 3.67 ft/sec Berg Balance Scale:  52/56 (12/10/23); 52-55/56 indicates lower (> 25%) risk for falls Functional gait assessment: 20/30 (12/10/23); 19-24/30 indicates medium risk for falls  12/10/23: 6 Minute  Walk- Baseline  BP (mmHg) 162/80   HR (bpm) 77   02 Sat (%RA) 96 %   Modified Borg Scale for Dyspnea 0- Nothing at all   Perceived Rate of Exertion (Borg) 6-   6 Minute walk- Post Test  BP (mmHg)  168/78   HR (bpm) 97   02 Sat (%RA) 97 %   Modified Borg Scale for Dyspnea 3- Moderate shortness of breath or breathing difficulty   Perceived Rate of Exertion (Borg) 12-   6 minute walk test results   Aerobic Endurance Distance Walked 1144', Normal range for females 80-90: 730' - 1840'  Standardized Balance Assessment  Standardized Balance Assessment Berg Balance Test   Berg Balance Test  Sit to Stand Able to stand without using hands and stabilize independently   Standing Unsupported Able to stand safely 2 minutes   Sitting with Back Unsupported but Feet Supported on Floor or Stool Able to sit safely and securely 2 minutes   Stand to Sit Sits safely with minimal use of hands   Transfers Able to transfer safely, minor use of hands   Standing Unsupported with Eyes Closed Able to stand 10 seconds safely   Standing Unsupported with Feet Together Able to place feet together independently and stand 1 minute safely   From Standing, Reach Forward with Outstretched Arm Can reach forward >12 cm safely (5)   From Standing Position, Pick up Object from Floor Able to pick up shoe safely and easily   From Standing Position, Turn to Look Behind Over each Shoulder Looks behind from both sides and weight shifts well   Turn 360 Degrees Able to turn 360 degrees safely in 4 seconds or less   Standing Unsupported, Alternately Place Feet on Step/Stool Able to stand independently and safely and complete 8 steps in 20 seconds   Standing Unsupported, One Foot in Front Able to plae foot ahead of the other independently and hold 30 seconds   Standing on One Leg Able to lift leg independently and hold equal to or more than 3 seconds   Total Score 52   Berg comment: 52-55 lower (> 25%) risk for falls   Functional  Gait  Assessment  Gait Level Surface Walks 20 ft in less than 7 sec but greater than 5.5 sec, uses assistive device, slower speed, mild gait deviations, or deviates 6-10 in outside of the 12 in walkway width.   Change in Gait Speed Able to smoothly change walking speed without loss of balance or gait deviation. Deviate no more than 6 in outside of the 12 in walkway width.   Gait with Horizontal Head Turns Performs head turns smoothly with slight change in gait velocity (eg, minor disruption to smooth gait path), deviates 6-10 in outside 12 in walkway width, or uses an assistive device.   Gait with Vertical Head Turns Performs task with slight change in gait velocity (eg, minor disruption to smooth gait path), deviates 6 - 10 in outside 12 in walkway width or uses assistive device   Gait and Pivot Turn Pivot turns safely within 3 sec and stops quickly with no loss of balance.   Step Over Obstacle Is able to step over 2 stacked shoe boxes taped together (9 in total height) without changing gait speed. No evidence of imbalance.   Gait with Narrow Base of Support Ambulates 4-7 steps.   Gait with Eyes Closed Walks 20 ft, slow speed, abnormal gait pattern, evidence for imbalance, deviates 10-15 in outside 12 in walkway width. Requires more than 9 sec to ambulate 20 ft.   Ambulating Backwards Walks 20 ft, slow speed, abnormal gait pattern, evidence for imbalance, deviates 10-15 in outside 12 in walkway width.   Steps Alternating feet, must use rail.   Total Score 20  FGA comment: 19-24 = medium risk fall    12/28/23: 5xSTS = 11.00 sec, <15 sec indicates decreased risk for recurrent falls FGA = 23/30, 19-24 = medium risk fall    PATIENT SURVEYS:  ABC scale 1310 / 1600 = 81.9 %; >80% indicates a high level of physical functioning   TODAY'S TREATMENT:   12/31/23 Nu-Step L1 x 6 min LE MMT Clock balance 12-6:00 x 10 BLE with intermittent UE support Step touch 9 step x 10 BLE intermittent UE  support Step touch from airex 9 step x 2/10 BLE with CGA/minA  Standing Red TB 4 way hip x 10 BLE UE w/ support  Mini squats with 5 sec holds x 10  12/28/2023  THERAPEUTIC EXERCISE: To improve strength and endurance.  Demonstration, verbal and tactile cues throughout for technique. Rec Bike - L3 x 6 min  PHYSICAL PERFORMANCE TEST or MEASUREMENT: 5xSTS = 11.00 sec, <15 sec indicates decreased risk for recurrent falls FGA = 23/30, 19-24 = medium risk fall  Functional Gait  Assessment  Gait Level Surface Walks 20 ft in less than 7 sec but greater than 5.5 sec, uses assistive device, slower speed, mild gait deviations, or deviates 6-10 in outside of the 12 in walkway width.   Change in Gait Speed Able to smoothly change walking speed without loss of balance or gait deviation. Deviate no more than 6 in outside of the 12 in walkway width.   Gait with Horizontal Head Turns Performs head turns smoothly with slight change in gait velocity (eg, minor disruption to smooth gait path), deviates 6-10 in outside 12 in walkway width, or uses an assistive device.   Gait with Vertical Head Turns Performs head turns with no change in gait. Deviates no more than 6 in outside 12 in walkway width.   Gait and Pivot Turn Pivot turns safely within 3 sec and stops quickly with no loss of balance.   Step Over Obstacle Is able to step over one shoe box (4.5 in total height) without changing gait speed. No evidence of imbalance.   Gait with Narrow Base of Support Ambulates 7-9 steps.   Gait with Eyes Closed Walks 20 ft, uses assistive device, slower speed, mild gait deviations, deviates 6-10 in outside 12 in walkway width. Ambulates 20 ft in less than 9 sec but greater than 7 sec.   Ambulating Backwards Walks 20 ft, uses assistive device, slower speed, mild gait deviations, deviates 6-10 in outside 12 in walkway width.   Steps Alternating feet, must use rail.   Total Score 23   FGA comment: 19-24 = medium risk fall        THERAPEUTIC ACTIVITIES: To improve functional performance.  Demonstration, verbal and tactile cues throughout for technique. Worked on floor to/from stand transfers via 1/2 kneel on R (on Airex pad) with L foot forward, pushing up from R hand on floor and L hand on knee x 1 rep Fwd step-up to 6 step x 10 B Fwd step-up to 8 step x 10 B  NEUROMUSCULAR RE-EDUCATION: To improve balance, coordination, kinesthesia, posture, proprioception, and reduce fall risk. Alt toe clears with light toe tap to 9 step x 20 SLS + 5-way (1/2 circle) toe tap to colored dots SLS + 3-way hip swing (flexion, ABD, extension) x 3 cycles w/o touching down, 1 pole A for balance    12/24/2023  THERAPEUTIC EXERCISE: To improve strength and endurance.  Demonstration, verbal and tactile cues throughout for technique.  Rec Bike - L2  x 6 min Standing HS curl with RTB at ankles x 15 bil  THERAPEUTIC ACTIVITIES: To improve functional performance.  Demonstration, verbal and tactile cues throughout for technique. Worked on floor to/from stand transfers via 1/2 kneel on R (on Airex pad) with L foot forward, pushing up from R hand on floor and L hand on knee x 2 reps - increased effort necessary but pt verbalizing good understanding of technique  NEUROMUSCULAR RE-EDUCATION: To improve balance, coordination, kinesthesia, posture, proprioception, and reduce fall risk. SLS + 5-way (1/2 circle) toe tap to colored dots SLS + 2-way hip swing (flexion & extension) x 5 cycles w/o touching down, 2 pole A for balance  SLS + 3-way hip swing (flexion, ABD, extension) x 5 cycles w/o touching down, 2 pole A for balance  Alt toe clears with light toe tap to 9 step x 10 Alt toe clears with light toe tap to 9 step from standing on Airex pad x 10   12/21/23 Nustep level 5 x 6 minutes Supine HS stretch x 30 strap Supine piriformis stretch x30 Supine quad stretch x 30  Supine ITB stretch x 30: S/L clamshell RTB 15x3 B Standing:   Hip extension 3lb x 10 B  Hip abduction 3lb x 10 B  Marching x 10 3lb B  HS curls RTB 2 x 10 B Education on fall risk   12/18/23 Nustep level 5 x 5 minutes Supine passive HS, piriformis, adductor stretches Supine bridges Supine green tband clamshells Side stepping Cone toe touch  On airex balance, head turns, eyes closed, ball toss On airex cone toe touches Backward walking Side step on and off airex Direction changes Star with colored dots PT calling out colors and her stepping toward SLS x 5 seconds each Talked about getting up off the floor   12/16/2023 THERAPEUTIC EXERCISE: To improve strength, endurance, and flexibility.  Demonstration, verbal and tactile cues throughout for technique.  Rec Bike - L1 x 6 min Seated hamstring curls with looped RTB at ankles 2 x 10 bil S/L RTB clam 2 x 10 Hooklying L HS stretch with strap x 30 Supine L HS stretch with strap x 30 Supine L ITB stretch with strap x 30 Hooklying L KTOS piriformis with strap for added reach x 30 Mod thomas L quad/hip flexor stretch with strap x 30  NEUROMUSCULAR RE-EDUCATION: To improve balance, coordination, kinesthesia, posture, and proprioception.  Standing alt hip abduction with looped RTB at thighs x 10 bil Standing alt hip abduction with looped RTB at ankles x 10 bil Standing alt hip extension with looped RTB at ankles x 10 bil Standing alt hip flexion march with looped RTB at midfeet x 10 bil   PATIENT EDUCATION:  Education details: HEP review and HEP update - LE stretching  Person educated: Patient Education method: Explanation, Demonstration, Tactile cues, Verbal cues, and Handouts Education comprehension: verbalized understanding, returned demonstration, verbal cues required, tactile cues required, and needs further education  HOME EXERCISE PROGRAM: Access Code: [email or web address] URL: [email or web address] Date: 12/21/2023 Prepared by: Ransome Helwig  Exercises - Supine Hamstring  Stretch with Strap  - 1-2 x daily - 7 x weekly - 3 reps - 30 sec hold - Supine Iliotibial Band Stretch with Strap  - 1-2 x daily - 7 x weekly - 3 reps - 30 sec hold - Supine Piriformis Stretch with Foot on Ground  - 1-2 x daily - 7 x weekly - 3 reps - 30 sec hold - Hip Flexor Stretch with Strap  on Table  - 1-2 x daily - 7 x weekly - 3 reps - 30 sec hold - Clam with Resistance  - 1 x daily - 5-7 x weekly - 2 sets - 10 reps - 3-5 sec hold - Standing Hip Abduction with Resistance at Ankles and Counter Support  - 1 x daily - 5-7 x weekly - 2 sets - 10 reps - 3 sec hold - Standing Hip Extension with Resistance at Ankles and Counter Support  - 1 x daily - 5-7 x weekly - 2 sets - 10 reps - 3 sec hold - Marching with Resistance  - 1 x daily - 5-7 x weekly - 2 sets - 10 reps - 3 sec hold - Standing Hamstring Curl with Resistance  - 1 x daily - 5-7 x weekly - 2 sets - 10 reps  Patient Education - What You Can Do to Prevent Falls   ASSESSMENT:  CLINICAL IMPRESSION: Reassessed LE strength with patient showing good gains but increased weakness on R LE. Progressed with balance activities working on single leg stability. Cues and instruction given throughout for technique.  Raychelle is demonstrating progress toward her PT goals (all STGs now met) and will benefit from continued skilled PT to address ongoing strength and balance deficits to improve mobility and activity tolerance with decreased pain interference and decreased risk for falls.   From EVAL: NAUTIKA CRESSEY is a 83 y.o. female who was referred to physical therapy for evaluation and treatment for fatigue with associated LE weakness.  Patient presents with physical impairments of impaired activity tolerance, impaired standing balance, impaired ambulation, and decreased safety awareness impacting safe and independent functional mobility.  Examination revealed gait speed of 3.67 ft/sec, (2.62 ft/sec is needed for community access) and TUG of 9.28 sec (>13.5  sec indicates increased risk for falls) WNL however increased risk for falls and functional decline as evidenced by the following objective test measures:  5xSTS of 17.03 sec (>15 sec indicates increased risk for falls and decreased BLE power). Further assessment of Berg, FGA and 6 minute walk test to be completed next visit.  Gali will benefit from skilled PT to address above deficits to improve mobility and activity tolerance to help reach the maximal level of functional independence and mobility. Patient demonstrates understanding of this POC and is in agreement with this plan.   OBJECTIVE IMPAIRMENTS: Abnormal gait, decreased activity tolerance, decreased balance, decreased mobility, difficulty walking, decreased strength, decreased safety awareness, increased fascial restrictions, impaired perceived functional ability, increased muscle spasms, improper body mechanics, postural dysfunction, and pain.   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   REHAB POTENTIAL: Good  CLINICAL DECISION MAKING: Evolving/moderate complexity  EVALUATION COMPLEXITY: Moderate   GOALS: Goals reviewed with patient? Yes  SHORT TERM GOALS: Target date: 01/04/2024  Patient will be independent with initial HEP to improve outcomes and carryover.  Baseline: Initial HEP provided on eval 12/10/23 - Pt has not yet attempted initial HEP 12/16/23 - HEP reviewed and updated Goal status: MET - 12/24/23  2.  Patient will be educated on strategies to  decrease risk of falls.  Baseline:  Goal status: MET - 12/21/23  3.  Patient will improve 5xSTS time to </= 15 seconds for improved efficiency and safety with transfers. Baseline: 17.03 sec Goal status: MET - 12/28/23 - 11.00 sec  LONG TERM GOALS: Target date: 02/01/2024  Patient will be independent with advanced/ongoing HEP to facilitate ability to maintain/progress functional gains from skilled physical therapy services. Baseline:  Goal status: IN PROGRESS  2.  Patient will be able to ambulate 600' with or w/o LRAD on variable surfaces including curb and stair negotiation with single rail with good safety to access community.  Baseline:  Goal status: IN PROGRESS  3. Patient will demonstrate improved B LE strength to >/= 4 to 4+/5 for improved stability and ease of mobility . Baseline: Refer to above LE MMT table Goal status: IN PROGRESS- 12/31/23 see MMT chart  4.  Patient will improve Berg score by at least 8 points or to >/= 52/56 to improve safety and stability with ADLs in standing and reduce risk for falls. (MCID= 8 points)  Baseline: 52/26 (12/10/23) Goal status: MET - 12/10/23  5. Patient will improve FGA score by at least 4 points or to at least 21/30 to improve gait stability and reduce risk for falls. Baseline: 20/30 (12/10/23) Goal status: IN PROGRESS - 12/28/23 - 23/30   PLAN:  PT FREQUENCY: 2x/week  PT DURATION: 8 weeks  PLANNED INTERVENTIONS: 02835- PT Re-evaluation, 97750- Physical Performance Testing, 97110-Therapeutic exercises, 97530- Therapeutic activity, 97112- Neuromuscular re-education, 97535- Self Care, 02859- Manual therapy, [id number]- Gait training, [phone]- Electrical stimulation (unattended), 97016- Vasopneumatic device, 97035- Ultrasound, 02966- Ionotophoresis 4mg /ml Dexamethasone , Patient/Family education, Balance training, Stair training, Taping, Dry Needling, Cryotherapy, and Moist heat  PLAN FOR NEXT SESSION: progress LE flexibility and strengthening; progress  balance training, esp SLS; review floor transfers as requested by patient   Sol LITTIE Gaskins, PTA 12/31/2023, 11:35 AM   Date of referral: 10/27/23 Referring provider: Daryl Setter, NP Referring diagnosis? R53.83 (ICD-10-CM) - Fatigue, unspecified type  Treatment diagnosis? (if different than referring diagnosis)  Muscle weakness (generalized)  Unsteadiness on feet  History of falling  Difficulty in walking, not elsewhere classified  What was this (referring dx) caused by? Fall, Ongoing Issue, and Unspecified  Nature of Condition: Chronic (continuous duration > 3 months)   Laterality: Both  Current Functional Measure Score: Other ABC scale 1380 / 1600 = 86.3 %   Objective measurements identify impairments when they are compared to normal values, the uninvolved extremity, and prior level of function.  [x]  Yes  []  No  Objective assessment of functional ability: Moderate functional limitations   Briefly describe symptoms: Patient presents with physical impairments of impaired activity tolerance, impaired standing balance, impaired ambulation, and decreased safety awareness impacting safe and independent functional mobility.  Examination revealed increased risk for falls and functional decline as evidenced by the following objective test measures: 5xSTS of 17.03 sec (>15 sec indicates increased risk for falls and decreased BLE power). Further assessment of Berg, FGA and 6 minute walk test to be completed next visit.  How did symptoms start: gradual onset  Average pain intensity:  Last 24 hours: 0/10  Past week: 0/10  How often does the pt experience symptoms? Intermittently  How much have the symptoms interfered with usual daily activities? A little bit  How has condition changed since care began at this facility? Better  In general, how is the patients overall health? Very Good  Onset date: ~1 year   BACK PAIN (STarT Back Screening Tool) - (When applicable):  N/A  Has your back  pain spread down your leg(s) at sometime in the last 2 weeks? []  Yes   []  No Have you had pain in the shoulder or neck at sometime in the past 2 weeks? []  Yes   []  No Have you only walked short distances because of your back pain? []  Yes   []  No In the past 2 weeks, have you dressed more slowly than usual because of your back pain? []  Yes   []  No Do you think it is not really safe for person with a condition like yours to be physically active? []  Yes   []  No Have worrying thoughts been going through your mind a lot of the time? []  Yes   []  No Do you feel that your back pain is terrible and it is never going to get any better? []  Yes   []  No In general, have you stopped enjoying all the things you usually enjoy? []  Yes   []  No Overall, how bothersome has your back pain been in the last 2 weeks? []  Not at all   []  Slightly     []  Moderate   []  Very much     []  Extremely

## 2024-01-04 ENCOUNTER — Ambulatory Visit: Admitting: Physical Therapy

## 2024-01-04 ENCOUNTER — Encounter: Payer: Self-pay | Admitting: Physical Therapy

## 2024-01-04 DIAGNOSIS — M6281 Muscle weakness (generalized): Secondary | ICD-10-CM

## 2024-01-04 DIAGNOSIS — R262 Difficulty in walking, not elsewhere classified: Secondary | ICD-10-CM

## 2024-01-04 DIAGNOSIS — R2681 Unsteadiness on feet: Secondary | ICD-10-CM

## 2024-01-04 DIAGNOSIS — Z9181 History of falling: Secondary | ICD-10-CM

## 2024-01-04 NOTE — Therapy (Signed)
 OUTPATIENT PHYSICAL THERAPY TREATMENT  Progress Note  Reporting Period 12/07/2023 to 01/04/2024   See note below for Objective Data and Assessment of Progress/Goals.     Patient Name: Angelica Davis MRN: [id number] DOB:1940-12-08, 83 y.o., female Today's Date: 01/04/2024   END OF SESSION:  PT End of Session - 01/04/24 0930     Visit Number 9    Date for PT Re-Evaluation 02/01/24    Authorization Type [employer]    Authorization Time Period reauthorization pending    Authorization - Number of Visits --    Progress Note Due on Visit 19   PN on visit #9 - 01/04/24   PT Start Time 0930    PT Stop Time 1014    PT Time Calculation (min) 44 min    Activity Tolerance Patient tolerated treatment well    Behavior During Therapy WFL for tasks assessed/performed                Past Medical History:  Diagnosis Date   Arthritis    Cataract    bilateral, has had removed   Colitis    Diverticulosis    History of blood transfusion    after birth of child   Hx of colonic polyps - adenomas 08/16/2002   Hyperlipidemia    Skin cancer    skin- On bridge of nose, basal cell carcinoma per patient   Past Surgical History:  Procedure Laterality Date   CATARACT EXTRACTION Bilateral 01/13/2019   COLONOSCOPY  last 05/12/2014   polyps removed 2015, 2020 TA x1   KNEE ARTHROSCOPY Left 06/2007   Dr. Jane   MOHS SURGERY     bridge of nose x 2 (unsure of dates ?2021 and 2018)   PARTIAL KNEE ARTHROPLASTY Right 08/27/2021   Procedure: UNICOMPARTMENTAL KNEE;  Surgeon: Josefina Chew, MD;  Location: [facility];  Service: Orthopedics;  Laterality: Right;   TONSILLECTOMY     TOTAL KNEE ARTHROPLASTY Left 12/2008   Dr. Jane   Patient Active Problem List   Diagnosis Date Noted   Dyspnea on exertion 10/27/2023   Fatigue 10/27/2023   Preventative health care 10/27/2023   Skin cancer 09/17/2022   Urinary incontinence 09/17/2022   Cough 09/17/2022   S/P right unicompartmental knee  replacement 08/27/2021   Osteoarthritis of right knee 08/06/2021   Pain of left hand 11/16/2019   Trigger finger of left hand 11/16/2019   Hyperlipidemia 09/04/2015   Hx of colonic polyps - adenomas 08/16/2002    PCP: Daryl Setter, NP   REFERRING PROVIDER: Daryl Setter, NP   REFERRING DIAG: R53.83 (ICD-10-CM) - Fatigue, unspecified type   C/o LE weakness, please evaluate for strength/balance.   THERAPY DIAG:  Muscle weakness (generalized)  Unsteadiness on feet  History of falling  Difficulty in walking, not elsewhere classified  RATIONALE FOR EVALUATION AND TREATMENT: Rehabilitation  ONSET DATE: up to 1 yr  NEXT MD VISIT: 11/15/2024   SUBJECTIVE:  SUBJECTIVE STATEMENT: Pt reports she has a kink in her L posterior thigh since working on her HEP on Saturday.  She denies L knee pain today but notes continued awareness of her knee.  EVAL: Pt reports her [place] - every time she has to take the trash out or go for the mail she finds herself feeling weak and out of breath.  She volunteers at [employer] where she slid/fell out of a chair to the floor ~1 month ago and could not get up.  PAIN: Are you having pain? Yes: NPRS scale: 4/10   Pain location: L posterior thigh  Pain description: sharp, spasm  Aggravating factors: walking (not every step but often)  Relieving factors: stop walking   PERTINENT HISTORY:  Arthritis, R unicompartmental knee replacement 08/2021, L TKA 12/2008, colitis, diverticulosis, HLD, skin cancer, DOE, fatigue, urinary incontinence  PRECAUTIONS: Fall  RED FLAGS: None  WEIGHT BEARING RESTRICTIONS: No  FALLS:  Has patient fallen in last 6 months? Yes. Number of falls 1 - fall out of a chair/stool   LIVING  ENVIRONMENT: Lives with: lives alone Lives in: House/apartment Stairs: Yes: External: 5-6 steps; on right going up, on left going up, and can reach both Has following equipment at home: Single point cane, Walker - 2 wheeled, and Ramped entry  OCCUPATION: Retired  PLOF: Independent and Leisure: no regular exercise, Comptroller at [employer]   PATIENT GOALS: To be able t walk/move around confidently.   OBJECTIVE: (objective measures completed at initial evaluation unless otherwise dated)  DIAGNOSTIC FINDINGS:  N/A  COGNITION: Overall cognitive status: Within functional limits for tasks assessed   SENSATION: WFL  COORDINATION: LE gross motor WFL  POSTURE:  rounded shoulders, forward head, and flexed trunk   MUSCLE LENGTH: Hamstrings: mild/mod tight B ITB: mild/mod tight B Piriformis: mod tight B Hip flexors: mod tight B Quads: mild/mod tight B Heelcord: mild/mod tight B  LOWER EXTREMITY ROM:    B knee grossly 0-115  LOWER EXTREMITY MMT:    MMT Right eval Left eval R 12/31/23 L 12/31/23  Hip flexion 4- 4- 4 4+  Hip extension 4- 4- 4- 4+  Hip abduction 4- 4- 4 4+  Hip adduction 4- 4- 5 4+  Hip internal rotation 4- 4- 4 4+  Hip external rotation 4- 4 4- 4  Knee flexion 3+ 3+ 4+ 4+  Knee extension 4+ 4+ 4+ 4  Ankle dorsiflexion 4 4 5 5   Ankle plantarflexion      Ankle inversion      Ankle eversion      (Blank rows = not tested)  BED MOBILITY:  Independent  TRANSFERS: Assistive device utilized: None  Sit to stand: Complete Independence Stand to sit: Complete Independence Chair to chair: Complete Independence Floor: NT  GAIT: Distance walked: Clinic distances Assistive device utilized: None Level of assistance: Complete Independence Gait pattern: WFL   FUNCTIONAL TESTS:  5 times sit to stand: 17.03 sec Timed up and go (TUG): 9.28 sec 6 minute walk test: see below (12/10/23) 10 meter walk test: 8.93 sec; Gait speed = 3.67 ft/sec Berg Balance Scale:   52/56 (12/10/23); 52-55/56 indicates lower (> 25%) risk for falls Functional gait assessment: 20/30 (12/10/23); 19-24/30 indicates medium risk for falls  12/10/23: 6 Minute Walk- Baseline  BP (mmHg) 162/80   HR (bpm) 77   02 Sat (%RA) 96 %   Modified Borg Scale for Dyspnea 0- Nothing at all   Perceived Rate of Exertion (Borg) 6-  6 Minute walk- Post Test  BP (mmHg) 168/78   HR (bpm) 97   02 Sat (%RA) 97 %   Modified Borg Scale for Dyspnea 3- Moderate shortness of breath or breathing difficulty   Perceived Rate of Exertion (Borg) 12-   6 minute walk test results   Aerobic Endurance Distance Walked 1144', Normal range for females 80-90: 730' - 1840'  Standardized Balance Assessment  Standardized Balance Assessment Berg Balance Test   Berg Balance Test  Sit to Stand Able to stand without using hands and stabilize independently   Standing Unsupported Able to stand safely 2 minutes   Sitting with Back Unsupported but Feet Supported on Floor or Stool Able to sit safely and securely 2 minutes   Stand to Sit Sits safely with minimal use of hands   Transfers Able to transfer safely, minor use of hands   Standing Unsupported with Eyes Closed Able to stand 10 seconds safely   Standing Unsupported with Feet Together Able to place feet together independently and stand 1 minute safely   From Standing, Reach Forward with Outstretched Arm Can reach forward >12 cm safely (5)   From Standing Position, Pick up Object from Floor Able to pick up shoe safely and easily   From Standing Position, Turn to Look Behind Over each Shoulder Looks behind from both sides and weight shifts well   Turn 360 Degrees Able to turn 360 degrees safely in 4 seconds or less   Standing Unsupported, Alternately Place Feet on Step/Stool Able to stand independently and safely and complete 8 steps in 20 seconds   Standing Unsupported, One Foot in Front Able to plae foot ahead of the other independently and hold 30 seconds   Standing  on One Leg Able to lift leg independently and hold equal to or more than 3 seconds   Total Score 52   Berg comment: 52-55 lower (> 25%) risk for falls   Functional Gait  Assessment  Gait Level Surface Walks 20 ft in less than 7 sec but greater than 5.5 sec, uses assistive device, slower speed, mild gait deviations, or deviates 6-10 in outside of the 12 in walkway width.   Change in Gait Speed Able to smoothly change walking speed without loss of balance or gait deviation. Deviate no more than 6 in outside of the 12 in walkway width.   Gait with Horizontal Head Turns Performs head turns smoothly with slight change in gait velocity (eg, minor disruption to smooth gait path), deviates 6-10 in outside 12 in walkway width, or uses an assistive device.   Gait with Vertical Head Turns Performs task with slight change in gait velocity (eg, minor disruption to smooth gait path), deviates 6 - 10 in outside 12 in walkway width or uses assistive device   Gait and Pivot Turn Pivot turns safely within 3 sec and stops quickly with no loss of balance.   Step Over Obstacle Is able to step over 2 stacked shoe boxes taped together (9 in total height) without changing gait speed. No evidence of imbalance.   Gait with Narrow Base of Support Ambulates 4-7 steps.   Gait with Eyes Closed Walks 20 ft, slow speed, abnormal gait pattern, evidence for imbalance, deviates 10-15 in outside 12 in walkway width. Requires more than 9 sec to ambulate 20 ft.   Ambulating Backwards Walks 20 ft, slow speed, abnormal gait pattern, evidence for imbalance, deviates 10-15 in outside 12 in walkway width.   Steps Alternating feet, must  use rail.   Total Score 20   FGA comment: 19-24 = medium risk fall    12/28/23: 5xSTS = 11.00 sec, <15 sec indicates decreased risk for recurrent falls FGA = 23/30, 19-24 = medium risk fall    PATIENT SURVEYS:  ABC scale 1310 / 1600 = 81.9 %; >80% indicates a high level of physical  functioning   TODAY'S TREATMENT:   01/04/2024 - PN THERAPEUTIC EXERCISE: To improve strength and endurance.  Demonstration, verbal and tactile cues throughout for technique. Rec Bike - L3 x 6 min Seated hip hinge L HS stretch x 30 Hooklying 3-way HS stretch with strap 3 x 30 (1 rep each with foot in neutral as well as LE ER & IR - best stretch felt in IR, therefore completed 2 extra reps) Hooklying HS stretch in neutral and LE IR with strap x 30 each following MT - pain resolved  MANUAL THERAPY: To promote normalized muscle tension, improved flexibility, and reduced pain utilizing connective tissue massage, therapeutic massage, and manual TP therapy.  STM/DTM and manual TPR to L distal biceps femoris  NEUROMUSCULAR RE-EDUCATION: To improve balance, coordination, kinesthesia, posture, and proprioception. Standing hip ABD with looped GTB at ankles 2 x 10 Standing hip extension with looped GTB at ankles 2 x 10 Seated alt hip ABD/ER with looped GTB at distal thighs 2 x 10 S/L R GTB clam 2 x 10   12/31/23 Nu-Step L1 x 6 min LE MMT Clock balance 12-6:00 x 10 BLE with intermittent UE support Step touch 9 step x 10 BLE intermittent UE support Step touch from airex 9 step x 2/10 BLE with CGA/minA  Standing Red TB 4 way hip x 10 BLE UE w/ support  Mini squats with 5 sec holds x 10   12/28/2023  THERAPEUTIC EXERCISE: To improve strength and endurance.  Demonstration, verbal and tactile cues throughout for technique. Rec Bike - L3 x 6 min  PHYSICAL PERFORMANCE TEST or MEASUREMENT: 5xSTS = 11.00 sec, <15 sec indicates decreased risk for recurrent falls FGA = 23/30, 19-24 = medium risk fall  Functional Gait  Assessment  Gait Level Surface Walks 20 ft in less than 7 sec but greater than 5.5 sec, uses assistive device, slower speed, mild gait deviations, or deviates 6-10 in outside of the 12 in walkway width.   Change in Gait Speed Able to smoothly change walking speed without loss of  balance or gait deviation. Deviate no more than 6 in outside of the 12 in walkway width.   Gait with Horizontal Head Turns Performs head turns smoothly with slight change in gait velocity (eg, minor disruption to smooth gait path), deviates 6-10 in outside 12 in walkway width, or uses an assistive device.   Gait with Vertical Head Turns Performs head turns with no change in gait. Deviates no more than 6 in outside 12 in walkway width.   Gait and Pivot Turn Pivot turns safely within 3 sec and stops quickly with no loss of balance.   Step Over Obstacle Is able to step over one shoe box (4.5 in total height) without changing gait speed. No evidence of imbalance.   Gait with Narrow Base of Support Ambulates 7-9 steps.   Gait with Eyes Closed Walks 20 ft, uses assistive device, slower speed, mild gait deviations, deviates 6-10 in outside 12 in walkway width. Ambulates 20 ft in less than 9 sec but greater than 7 sec.   Ambulating Backwards Walks 20 ft, uses assistive device, slower speed,  mild gait deviations, deviates 6-10 in outside 12 in walkway width.   Steps Alternating feet, must use rail.   Total Score 23   FGA comment: 19-24 = medium risk fall       THERAPEUTIC ACTIVITIES: To improve functional performance.  Demonstration, verbal and tactile cues throughout for technique. Worked on floor to/from stand transfers via 1/2 kneel on R (on Airex pad) with L foot forward, pushing up from R hand on floor and L hand on knee x 1 rep Fwd step-up to 6 step x 10 B Fwd step-up to 8 step x 10 B  NEUROMUSCULAR RE-EDUCATION: To improve balance, coordination, kinesthesia, posture, proprioception, and reduce fall risk. Alt toe clears with light toe tap to 9 step x 20 SLS + 5-way (1/2 circle) toe tap to colored dots SLS + 3-way hip swing (flexion, ABD, extension) x 3 cycles w/o touching down, 1 pole A for balance    12/24/2023  THERAPEUTIC EXERCISE: To improve strength and endurance.  Demonstration, verbal  and tactile cues throughout for technique.  Rec Bike - L2 x 6 min Standing HS curl with RTB at ankles x 15 bil  THERAPEUTIC ACTIVITIES: To improve functional performance.  Demonstration, verbal and tactile cues throughout for technique. Worked on floor to/from stand transfers via 1/2 kneel on R (on Airex pad) with L foot forward, pushing up from R hand on floor and L hand on knee x 2 reps - increased effort necessary but pt verbalizing good understanding of technique  NEUROMUSCULAR RE-EDUCATION: To improve balance, coordination, kinesthesia, posture, proprioception, and reduce fall risk. SLS + 5-way (1/2 circle) toe tap to colored dots SLS + 2-way hip swing (flexion & extension) x 5 cycles w/o touching down, 2 pole A for balance  SLS + 3-way hip swing (flexion, ABD, extension) x 5 cycles w/o touching down, 2 pole A for balance  Alt toe clears with light toe tap to 9 step x 10 Alt toe clears with light toe tap to 9 step from standing on Airex pad x 10   12/21/23 Nustep level 5 x 6 minutes Supine HS stretch x 30 strap Supine piriformis stretch x30 Supine quad stretch x 30  Supine ITB stretch x 30: S/L clamshell RTB 15x3 B Standing:  Hip extension 3lb x 10 B  Hip abduction 3lb x 10 B  Marching x 10 3lb B  HS curls RTB 2 x 10 B Education on fall risk   12/18/23 Nustep level 5 x 5 minutes Supine passive HS, piriformis, adductor stretches Supine bridges Supine green tband clamshells Side stepping Cone toe touch  On airex balance, head turns, eyes closed, ball toss On airex cone toe touches Backward walking Side step on and off airex Direction changes Star with colored dots PT calling out colors and her stepping toward SLS x 5 seconds each Talked about getting up off the floor   12/16/2023 THERAPEUTIC EXERCISE: To improve strength, endurance, and flexibility.  Demonstration, verbal and tactile cues throughout for technique.  Rec Bike - L1 x 6 min Seated hamstring curls  with looped RTB at ankles 2 x 10 bil S/L RTB clam 2 x 10 Hooklying L HS stretch with strap x 30 Supine L HS stretch with strap x 30 Supine L ITB stretch with strap x 30 Hooklying L KTOS piriformis with strap for added reach x 30 Mod thomas L quad/hip flexor stretch with strap x 30  NEUROMUSCULAR RE-EDUCATION: To improve balance, coordination, kinesthesia, posture, and proprioception.  Standing alt  hip abduction with looped RTB at thighs x 10 bil Standing alt hip abduction with looped RTB at ankles x 10 bil Standing alt hip extension with looped RTB at ankles x 10 bil Standing alt hip flexion march with looped RTB at midfeet x 10 bil   PATIENT EDUCATION:  Education details: HEP progression - GTB for hip strengthening  Person educated: Patient Education method: Programmer, multimedia, Demonstration, and Verbal cues Education comprehension: verbalized understanding, returned demonstration, verbal cues required, and needs further education  HOME EXERCISE PROGRAM: Access Code: [email or web address] URL: [email or web address] Date: 12/21/2023 Prepared by: Braylin Clark  Exercises - Supine Hamstring Stretch with Strap  - 1-2 x daily - 7 x weekly - 3 reps - 30 sec hold - Supine Iliotibial Band Stretch with Strap  - 1-2 x daily - 7 x weekly - 3 reps - 30 sec hold - Supine Piriformis Stretch with Foot on Ground  - 1-2 x daily - 7 x weekly - 3 reps - 30 sec hold - Hip Flexor Stretch with Strap on Table  - 1-2 x daily - 7 x weekly - 3 reps - 30 sec hold - Clam with Resistance  - 1 x daily - 5-7 x weekly - 2 sets - 10 reps - 3-5 sec hold - Standing Hip Abduction with Resistance at Ankles and Counter Support  - 1 x daily - 5-7 x weekly - 2 sets - 10 reps - 3 sec hold - Standing Hip Extension with Resistance at Ankles and Counter Support  - 1 x daily - 5-7 x weekly - 2 sets - 10 reps - 3 sec hold - Marching with Resistance  - 1 x daily - 5-7 x weekly - 2 sets - 10 reps - 3 sec hold - Standing  Hamstring Curl with Resistance  - 1 x daily - 5-7 x weekly - 2 sets - 10 reps  Patient Education - What You Can Do to Prevent Falls   ASSESSMENT:  CLINICAL IMPRESSION: Kecia reports she has been having a kink in her L distal posterior thigh since working on her HEP on Saturday.  Increased muscle tension and TTP noted at distal L biceps femoris - addressed with MT and stretching resulting in resolution of pain/kink.  Progressed proximal LE strengthening with progression of resistance band to GTB with increased effort noted and slightly decreased ROM on R but well tolerated, therefore GTB provided for home use.  Caili is demonstrating good progress toward her goals with recent gains noted in LE strength as well as standardized balance testing, with all STGs not met and progress observed toward LTGs.  She will benefit from continued skilled PT to address ongoing strength and balance deficits to improve mobility and activity tolerance with decreased pain interference and decreased risk for falls.   From EVAL: RAMIAH HELFRICH is a 83 y.o. female who was referred to physical therapy for evaluation and treatment for fatigue with associated LE weakness.  Patient presents with physical impairments of impaired activity tolerance, impaired standing balance, impaired ambulation, and decreased safety awareness impacting safe and independent functional mobility.  Examination revealed gait speed of 3.67 ft/sec, (2.62 ft/sec is needed for community access) and TUG of 9.28 sec (>13.5 sec indicates increased risk for falls) WNL however increased risk for falls and functional decline as evidenced by the following objective test measures:  5xSTS of 17.03 sec (>15 sec indicates increased risk for falls and decreased BLE power). Further assessment of Berg, FGA and 6 minute walk test  to be completed next visit.  Jla will benefit from skilled PT to address above deficits to improve mobility and activity tolerance to help  reach the maximal level of functional independence and mobility. Patient demonstrates understanding of this POC and is in agreement with this plan.   OBJECTIVE IMPAIRMENTS: Abnormal gait, decreased activity tolerance, decreased balance, decreased mobility, difficulty walking, decreased strength, decreased safety awareness, increased fascial restrictions, impaired perceived functional ability, increased muscle spasms, improper body mechanics, postural dysfunction, and pain.   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   REHAB POTENTIAL: Good  CLINICAL DECISION MAKING: Evolving/moderate complexity  EVALUATION COMPLEXITY: Moderate   GOALS: Goals reviewed with patient? Yes  SHORT TERM GOALS: Target date: 01/04/2024  Patient will be independent with initial HEP to improve outcomes and carryover.  Baseline: Initial HEP provided on eval 12/10/23 - Pt has not yet attempted initial HEP 12/16/23 - HEP reviewed and updated Goal status: MET - 12/24/23  2.  Patient will be educated on strategies to decrease risk of falls.  Baseline:  Goal status: MET - 12/21/23  3.  Patient will improve 5xSTS time to </= 15 seconds for improved efficiency and safety with transfers. Baseline: 17.03 sec Goal status: MET - 12/28/23 - 11.00 sec  LONG TERM GOALS: Target date: 02/01/2024  Patient will be independent with advanced/ongoing HEP to facilitate ability to maintain/progress functional gains from skilled physical therapy  services. Baseline:  Goal status: IN PROGRESS - 01/04/24  2.  Patient will be able to ambulate 600' with or w/o LRAD on variable surfaces including curb and stair negotiation with single rail with good safety to access community.  Baseline:  Goal status: IN PROGRESS  3. Patient will demonstrate improved B LE strength to >/= 4 to 4+/5 for improved stability and ease of mobility . Baseline: Refer to above LE MMT table Goal status: IN PROGRESS - 12/31/23 see MMT chart  4.  Patient will improve Berg score by at least 8 points or to >/= 52/56 to improve safety and stability with ADLs in standing and reduce risk for falls. (MCID= 8 points)  Baseline: 52/26 (12/10/23) Goal status: MET - 12/10/23  5. Patient will improve FGA score by at least 4 points or to at least 21/30 to improve gait stability and reduce risk for falls. Baseline: 20/30 (12/10/23) Goal status: IN PROGRESS - 12/28/23 - 23/30   PLAN:  PT FREQUENCY: 2x/week  PT DURATION: 8 weeks  PLANNED INTERVENTIONS: 02835- PT Re-evaluation, 97750- Physical Performance Testing, 97110-Therapeutic exercises, 97530- Therapeutic activity, 97112- Neuromuscular re-education, 97535- Self Care, 02859- Manual therapy, [phone]- Gait training, [phone]- Electrical stimulation (unattended), [id number]- Vasopneumatic device, 97035- Ultrasound, 02966- Ionotophoresis 4mg /ml Dexamethasone , Patient/Family education, Balance training, Stair training, Taping, Dry Needling, Cryotherapy, and Moist heat  PLAN FOR NEXT SESSION: progress LE flexibility and strengthening; progress balance training, esp SLS; review floor transfers as requested by patient   Elijah CHRISTELLA Hidden, PT 01/04/2024, 2:01 PM   Date of referral: 10/27/23 Referring provider: Daryl Setter, NP Referring diagnosis? R53.83 (ICD-10-CM) - Fatigue, unspecified type  Treatment diagnosis? (if different than referring diagnosis)  Muscle weakness (generalized)  Unsteadiness on feet  History of  falling  Difficulty  in walking, not elsewhere classified  What was this (referring dx) caused by? Fall, Ongoing Issue, and Unspecified  Nature of Condition: Chronic (continuous duration > 3 months)   Laterality: Both  Current Functional Measure Score: Other ABC scale 1380 / 1600 = 86.3 %   Objective measurements identify impairments when they are compared to normal values, the uninvolved extremity, and prior level of function.  [x]  Yes  []  No  Objective assessment of functional ability: Moderate functional limitations   Briefly describe symptoms: Patient presents with physical impairments of impaired activity tolerance, impaired standing balance, impaired ambulation, and decreased safety awareness impacting safe and independent functional mobility.  Examination revealed increased risk for falls and functional decline as evidenced by the following objective test measures: 5xSTS of 17.03 sec (>15 sec indicates increased risk for falls and decreased BLE power). Further assessment of Berg, FGA and 6 minute walk test to be completed next visit.  How did symptoms start: gradual onset  Average pain intensity:  Last 24 hours: 0/10  Past week: 0/10  How often does the pt experience symptoms? Intermittently  How much have the symptoms interfered with usual daily activities? A little bit  How has condition changed since care began at this facility? Better  In general, how is the patients overall health? Very Good  Onset date: ~1 year   BACK PAIN (STarT Back Screening Tool) - (When applicable): N/A  Has your back pain spread down your leg(s) at sometime in the last 2 weeks? []  Yes   []  No Have you had pain in the shoulder or neck at sometime in the past 2 weeks? []  Yes   []  No Have you only walked short distances because of your back pain? []  Yes   []  No In the past 2 weeks, have you dressed more slowly than usual because of your back pain? []  Yes   []  No Do you think it is not  really safe for person with a condition like yours to be physically active? []  Yes   []  No Have worrying thoughts been going through your mind a lot of the time? []  Yes   []  No Do you feel that your back pain is terrible and it is never going to get any better? []  Yes   []  No In general, have you stopped enjoying all the things you usually enjoy? []  Yes   []  No Overall, how bothersome has your back pain been in the last 2 weeks? []  Not at all   []  Slightly     []  Moderate   []  Very much     []  Extremely

## 2024-01-06 NOTE — Progress Notes (Unsigned)
 Cardiology Office Note   Date:  01/06/2024   ID:  Angelica Davis [birth year], MRN [id number]  PCP:  Daryl Setter, NP  Cardiologist:   None Referring:  ***  No chief complaint on file.     History of Present Illness: Angelica Davis is a 83 y.o. female who presents for evaluation of elevated coronary calcium.  The score was 8.08 which was 18th percentile.  ***    Past Medical History:  Diagnosis Date   Arthritis    Cataract    bilateral, has had removed   Colitis    Diverticulosis    History of blood transfusion    after birth of child   Hx of colonic polyps - adenomas 08/16/2002   Hyperlipidemia    Skin cancer    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 Jane   MOHS SURGERY     bridge of nose x 2 (unsure of dates ?2021 and 2018)   PARTIAL KNEE ARTHROPLASTY Right 08/27/2021   Procedure: UNICOMPARTMENTAL KNEE;  Surgeon: Josefina Chew, MD;  Location: [facility];  Service: Orthopedics;  Laterality: Right;   TONSILLECTOMY     TOTAL KNEE ARTHROPLASTY Left 12/2008   Dr. Jane     Current Outpatient Medications  Medication Sig Dispense Refill   acetaminophen  (TYLENOL ) 500 MG tablet Take 500 mg by mouth every 6 (six) hours as needed.     Ascorbic Acid (VITAMIN C PO) Take 1 tablet by mouth 2 (two) times daily.     fluticasone  (FLONASE ) 50 MCG/ACT nasal spray Place 1 spray into both nostrils daily. (Patient not taking: Reported on 12/07/2023) 16 g 0   TART CHERRY PO Take by mouth.     VITAMIN D PO Take 1 capsule by mouth daily.     No current facility-administered medications for this visit.    Allergies:   Fenofibrate, Lovastatin, and Pravastatin    Social History:  The patient  reports that she has never smoked. She has never used smokeless  tobacco. She reports that she does not drink alcohol and does not use drugs.   Family History:  The patient's ***family history includes ADD / ADHD in her son; Arthritis in her maternal grandfather; Breast cancer in her paternal aunt; COPD in her father; CVA in her maternal grandmother; Colon cancer (age of onset: 46) in her mother; Colon polyps in her father and mother; Dementia in her maternal grandfather, mother, and paternal grandmother; Diabetes Mellitus II in her brother and daughter; Diverticulitis in her father; Heart disease in her paternal grandmother; Other in her father; Suicidality in her paternal grandfather.    ROS:  Please see the history of present illness.   Otherwise, review of systems are positive for {NONE DEFAULTED:18576}.   All other systems are reviewed and negative.    PHYSICAL EXAM: VS:  LMP 07/07/2004  , BMI There is no height or weight on file to calculate BMI. GENERAL:  Well appearing HEENT:  Pupils equal round and reactive, fundi not visualized, oral mucosa unremarkable NECK:  No jugular venous distention, waveform within normal limits, carotid upstroke brisk and symmetric, no bruits, no thyromegaly LYMPHATICS:  No cervical, inguinal adenopathy LUNGS:  Clear to auscultation bilaterally BACK:  No CVA tenderness CHEST:  Unremarkable HEART:  PMI not displaced or sustained,S1 and S2 within normal limits, no S3, no S4, no clicks, no rubs, *** murmurs ABD:  Flat, positive bowel sounds normal in frequency in pitch, no bruits, no rebound, no guarding, no midline pulsatile mass, no hepatomegaly, no splenomegaly EXT:  2 plus pulses throughout, no edema, no cyanosis no clubbing SKIN:  No rashes no nodules NEURO:  Cranial nerves II through XII grossly intact, motor grossly intact throughout PSYCH:  Cognitively intact, oriented to person place and time    EKG:        Recent Labs: 10/27/2023: ALT 28; BUN 22; Creatinine, Ser 0.84; Hemoglobin 13.1; Platelets 221.0;  Potassium 5.0; Sodium 141; TSH 3.25    Lipid Panel    Component Value Date/Time   CHOL 234 (H) 10/27/2023 1231   TRIG 175.0 (H) 10/27/2023 1231   HDL 50.10 10/27/2023 1231   CHOLHDL 5 10/27/2023 1231   VLDL 35.0 10/27/2023 1231   LDLCALC 149 (H) 10/27/2023 1231      Wt Readings from Last 3 Encounters:  11/10/23 226 lb (102.5 kg)  10/27/23 226 lb (102.5 kg)  09/17/22 225 lb (102.1 kg)      Other studies Reviewed: Additional studies/ records that were reviewed today include: ***. Review of the above records demonstrates:  Please see elsewhere in the note.  ***   ASSESSMENT AND PLAN:  Elevated coronary calcium:  ***   Current medicines are reviewed at length with the patient today.  The patient {ACTIONS; HAS/DOES NOT HAVE:19233} concerns regarding medicines.  The following changes have been made:  {PLAN; NO CHANGE:13088:s}  Labs/ tests ordered today include: *** No orders of the defined types were placed in this encounter.    Disposition:   FU with ***    Signed, Lynwood Schilling, MD  01/06/2024 9:16 PM    [facility]

## 2024-01-07 ENCOUNTER — Encounter: Payer: Self-pay | Admitting: Cardiology

## 2024-01-07 ENCOUNTER — Ambulatory Visit

## 2024-01-07 ENCOUNTER — Ambulatory Visit: Attending: Cardiology | Admitting: Cardiology

## 2024-01-07 VITALS — BP 136/84 | HR 75 | Ht 62.5 in | Wt 231.0 lb

## 2024-01-07 DIAGNOSIS — R0602 Shortness of breath: Secondary | ICD-10-CM | POA: Diagnosis not present

## 2024-01-07 DIAGNOSIS — R931 Abnormal findings on diagnostic imaging of heart and coronary circulation: Secondary | ICD-10-CM | POA: Diagnosis not present

## 2024-01-07 NOTE — Progress Notes (Unsigned)
 Enrolled patient for a 3 day Zio XT monitor to be mailed to patients home

## 2024-01-07 NOTE — Patient Instructions (Addendum)
 Medication Instructions:  Your physician recommends that you continue on your current medications as directed. Please refer to the Current Medication list given to you today.  *If you need a refill on your cardiac medications before your next appointment, please call your pharmacy*  Lab Work: BNP today If you have labs (blood work) drawn today and your tests are completely normal, you will receive your results only by: MyChart Message (if you have MyChart) OR A paper copy in the mail If you have any lab test that is abnormal or we need to change your treatment, we will call you to review the results.  Testing/Procedures: Echo Your physician has requested that you have an echocardiogram. Echocardiography is a painless test that uses sound waves to create images of your heart. It provides your doctor with information about the size and shape of your heart and how well your heart's chambers and valves are working. This procedure takes approximately one hour. There are no restrictions for this procedure. Please do NOT wear cologne, perfume, aftershave, or lotions (deodorant is allowed). Please arrive 15 minutes prior to your appointment time.  Please note: We ask at that you not bring children with you during ultrasound (echo/ vascular) testing. Due to room size and safety concerns, children are not allowed in the ultrasound rooms during exams. Our front office staff cannot provide observation of children in our lobby area while testing is being conducted. An adult accompanying a patient to their appointment will only be allowed in the ultrasound room at the discretion of the ultrasound technician under special circumstances. We apologize for any inconvenience.  3 Day Zio Heart Monitor Your physician has requested that you wear a Zio heart monitor for ___3__ days. This will be mailed to your home with instructions on how to apply the monitor and how to return it when finished. Please allow 2 weeks  after returning the heart monitor before our office calls you with the results.   Follow-Up: At [facility], you and your health needs are our priority.  As part of our continuing mission to provide you with exceptional heart care, our providers are all part of one team.  This team includes your primary Cardiologist (physician) and Advanced Practice Providers or APPs (Physician Assistants and Nurse Practitioners) who all work together to provide you with the care you need, when you need it.  Your next appointment:   As needed  Provider:   [place], MD  We recommend signing up for the patient portal called MyChart.  Sign up information is provided on this After Visit Summary.  MyChart is used to connect with patients for Virtual Visits (Telemedicine).  Patients are able to view lab/test results, encounter notes, upcoming appointments, etc.  Non-urgent messages can be sent to your provider as well.   To learn more about what you can do with MyChart, go to [email or web address].   Other Instructions ZIO XT- Long Term Monitor Instructions  Your physician has requested you wear a ZIO patch monitor for 14 days.  This is a single patch monitor. [employer] supplies one patch monitor per enrollment. Additional stickers are not available. Please do not apply patch if you will be having a Nuclear Stress Test,  Echocardiogram, Cardiac CT, MRI, or Chest Xray during the period you would be wearing the  monitor. The patch cannot be worn during these tests. You cannot remove and re-apply the  ZIO XT patch monitor.  Your ZIO patch monitor will be mailed 3 day USPS  to your address on file. It may take 3-5 days  to receive your monitor after you have been enrolled.  Once you have received your monitor, please review the enclosed instructions. Your monitor  has already been registered assigning a specific monitor serial # to you.  Billing and Patient Assistance Program Information  We have  supplied [employer] with any of your insurance information on file for billing purposes. [employer] offers a sliding scale Patient Assistance Program for patients that do not have  insurance, or whose insurance does not completely cover the cost of the ZIO monitor.  You must apply for the Patient Assistance Program to qualify for this discounted rate.  To apply, please call [employer] at [phone], select option 4, select option 2, ask to apply for  Patient Assistance Program. Meredeth will ask your household income, and how many people  are in your household. They will quote your out-of-pocket cost based on that information.  [employer] will also be able to set up a 37-month, interest-free payment plan if needed.  Applying the monitor   Shave hair from upper left chest.  Hold abrader disc by orange tab. Rub abrader in 40 strokes over the upper left chest as  indicated in your monitor instructions.  Clean area with 4 enclosed alcohol pads. Let dry.  Apply patch as indicated in monitor instructions. Patch will be placed under collarbone on left  side of chest with arrow pointing upward.  Rub patch adhesive wings for 2 minutes. Remove white label marked 1. Remove the white  label marked 2. Rub patch adhesive wings for 2 additional minutes.  While looking in a mirror, press and release button in center of patch. A small green light will  flash 3-4 times. This will be your only indicator that the monitor has been turned on.  Do not shower for the first 24 hours. You may shower after the first 24 hours.  Press the button if you feel a symptom. You will hear a small click. Record Date, Time and  Symptom in the Patient Logbook.  When you are ready to remove the patch, follow instructions on the last 2 pages of Patient  Logbook. Stick patch monitor onto the last page of Patient Logbook.  Place Patient Logbook in the blue and white box. Use locking tab on box and tape box closed  securely. The blue and  white box has prepaid postage on it. Please place it in the mailbox as  soon as possible. Your physician should have your test results approximately 7 days after the  monitor has been mailed back to [facility].  Call [facility] Customer Care at [phone] if you have questions regarding  your ZIO XT patch monitor. Call them immediately if you see an orange light blinking on your  monitor.  If your monitor falls off in less than 4 days, contact our Monitor department at [phone].  If your monitor becomes loose or falls off after 4 days call [employer] at [phone] for  suggestions on securing your monitor

## 2024-01-08 ENCOUNTER — Ambulatory Visit: Payer: Self-pay | Admitting: Cardiology

## 2024-01-08 LAB — PRO B NATRIURETIC PEPTIDE: NT-Pro BNP: 538 pg/mL (ref 0–738)

## 2024-01-12 ENCOUNTER — Ambulatory Visit: Attending: Family

## 2024-01-12 DIAGNOSIS — Z9181 History of falling: Secondary | ICD-10-CM | POA: Diagnosis present

## 2024-01-12 DIAGNOSIS — R262 Difficulty in walking, not elsewhere classified: Secondary | ICD-10-CM | POA: Diagnosis present

## 2024-01-12 DIAGNOSIS — M6281 Muscle weakness (generalized): Secondary | ICD-10-CM | POA: Diagnosis present

## 2024-01-12 DIAGNOSIS — R2681 Unsteadiness on feet: Secondary | ICD-10-CM | POA: Insufficient documentation

## 2024-01-12 NOTE — Therapy (Signed)
 OUTPATIENT PHYSICAL THERAPY TREATMENT      Patient Name: Angelica Davis MRN: [id number] DOB:06/14/1941, 83 y.o., female Today's Date: 01/12/2024   END OF SESSION:  PT End of Session - 01/12/24 1452     Visit Number 10    Date for PT Re-Evaluation 02/01/24    Authorization Type [employer]    Authorization Time Period 01/04/24-02/01/24    Authorization - Visit Number 1    Authorization - Number of Visits 4    Progress Note Due on Visit 19   PN on visit #9 - 01/04/24   PT Start Time 1448    PT Stop Time 1532    PT Time Calculation (min) 44 min    Activity Tolerance Patient tolerated treatment well    Behavior During Therapy [facility] for tasks assessed/performed                 Past Medical History:  Diagnosis Date   Arthritis    Cataract    bilateral, has had removed   Colitis    Diverticulosis    History of blood transfusion    after birth of child   Hx of colonic polyps - adenomas 08/16/2002   Hyperlipidemia    Skin cancer    skin- On bridge of nose, basal cell carcinoma per patient   Past Surgical History:  Procedure Laterality Date   CATARACT EXTRACTION Bilateral 01/13/2019   COLONOSCOPY  last 05/12/2014   polyps removed 2015, 2020 TA x1   KNEE ARTHROSCOPY Left 06/2007   Dr. Jane   MOHS SURGERY     bridge of nose x 2 (unsure of dates ?2021 and 2018)   PARTIAL KNEE ARTHROPLASTY Right 08/27/2021   Procedure: UNICOMPARTMENTAL KNEE;  Surgeon: Josefina Chew, MD;  Location: [facility];  Service: Orthopedics;  Laterality: Right;   TONSILLECTOMY     TOTAL KNEE ARTHROPLASTY Left 12/2008   Dr. Jane   Patient Active Problem List   Diagnosis Date Noted   Dyspnea on exertion 10/27/2023   Fatigue 10/27/2023   Preventative health care 10/27/2023   Skin cancer 09/17/2022   Urinary incontinence 09/17/2022   Cough 09/17/2022   S/P right unicompartmental knee replacement 08/27/2021   Osteoarthritis of right knee 08/06/2021   Pain of left hand 11/16/2019   Trigger  finger of left hand 11/16/2019   Hyperlipidemia 09/04/2015   Hx of colonic polyps - adenomas 08/16/2002    PCP: Daryl Setter, NP   REFERRING PROVIDER: Daryl Setter, NP   REFERRING DIAG: R53.83 (ICD-10-CM) - Fatigue, unspecified type   C/o LE weakness, please evaluate for strength/balance.   THERAPY DIAG:  Muscle weakness (generalized)  Unsteadiness on feet  History of falling  Difficulty in walking, not elsewhere classified  RATIONALE FOR EVALUATION AND TREATMENT: Rehabilitation  ONSET DATE: up to 1 yr  NEXT MD VISIT: 11/15/2024   SUBJECTIVE:  SUBJECTIVE STATEMENT: Pt denies any new complaints.   EVAL: Pt reports her [place] is [place] - every time she has to take the trash out or go for the mail she finds herself feeling weak and out of breath.  She volunteers at [employer] where she slid/fell out of a chair to the floor ~1 month ago and could not get up.  PAIN: Are you having pain? Yes: NPRS scale: 0/10   Pain location: N/A Pain description: N/A Aggravating factors: walking (not every step but often)  Relieving factors: stop walking   PERTINENT HISTORY:  Arthritis, R unicompartmental knee replacement 08/2021, L TKA 12/2008, colitis, diverticulosis, HLD, skin cancer, DOE, fatigue, urinary incontinence  PRECAUTIONS: Fall  RED FLAGS: None  WEIGHT BEARING RESTRICTIONS: No  FALLS:  Has patient fallen in last 6 months? Yes. Number of falls 1 - fall out of a chair/stool   LIVING ENVIRONMENT: Lives with: lives alone Lives in: House/apartment Stairs: Yes: External: 5-6 steps; on right going up, on left going up, and can reach both Has following equipment at home: Single point cane, Walker - 2 wheeled, and Ramped entry  OCCUPATION: Retired  PLOF:  Independent and Leisure: no regular exercise, Comptroller at [employer]   PATIENT GOALS: To be able t walk/move around confidently.   OBJECTIVE: (objective measures completed at initial evaluation unless otherwise dated)  DIAGNOSTIC FINDINGS:  N/A  COGNITION: Overall cognitive status: Within functional limits for tasks assessed   SENSATION: WFL  COORDINATION: LE gross motor WFL  POSTURE:  rounded shoulders, forward head, and flexed trunk   MUSCLE LENGTH: Hamstrings: mild/mod tight B ITB: mild/mod tight B Piriformis: mod tight B Hip flexors: mod tight B Quads: mild/mod tight B Heelcord: mild/mod tight B  LOWER EXTREMITY ROM:    B knee grossly 0-115  LOWER EXTREMITY MMT:    MMT Right eval Left eval R 12/31/23 L 12/31/23  Hip flexion 4- 4- 4 4+  Hip extension 4- 4- 4- 4+  Hip abduction 4- 4- 4 4+  Hip adduction 4- 4- 5 4+  Hip internal rotation 4- 4- 4 4+  Hip external rotation 4- 4 4- 4  Knee flexion 3+ 3+ 4+ 4+  Knee extension 4+ 4+ 4+ 4  Ankle dorsiflexion 4 4 5 5   Ankle plantarflexion      Ankle inversion      Ankle eversion      (Blank rows = not tested)  BED MOBILITY:  Independent  TRANSFERS: Assistive device utilized: None  Sit to stand: Complete Independence Stand to sit: Complete Independence Chair to chair: Complete Independence Floor: NT  GAIT: Distance walked: Clinic distances Assistive device utilized: None Level of assistance: Complete Independence Gait pattern: WFL   FUNCTIONAL TESTS:  5 times sit to stand: 17.03 sec Timed up and go (TUG): 9.28 sec 6 minute walk test: see below (12/10/23) 10 meter walk test: 8.93 sec; Gait speed = 3.67 ft/sec Berg Balance Scale:  52/56 (12/10/23); 52-55/56 indicates lower (> 25%) risk for falls Functional gait assessment: 20/30 (12/10/23); 19-24/30 indicates medium risk for falls  12/10/23: 6 Minute Walk- Baseline  BP (mmHg) 162/80   HR (bpm) 77   02 Sat (%RA) 96 %   Modified Borg Scale for Dyspnea 0-  Nothing at all   Perceived Rate of Exertion (Borg) 6-   6 Minute walk- Post Test  BP (mmHg) 168/78   HR (bpm) 97   02 Sat (%RA) 97 %   Modified Borg Scale for Dyspnea 3-  Moderate shortness of breath or breathing difficulty   Perceived Rate of Exertion (Borg) 12-   6 minute walk test results   Aerobic Endurance Distance Walked 1144', Normal range for females 80-90: 730' - 1840'  Standardized Balance Assessment  Standardized Balance Assessment Berg Balance Test   Berg Balance Test  Sit to Stand Able to stand without using hands and stabilize independently   Standing Unsupported Able to stand safely 2 minutes   Sitting with Back Unsupported but Feet Supported on Floor or Stool Able to sit safely and securely 2 minutes   Stand to Sit Sits safely with minimal use of hands   Transfers Able to transfer safely, minor use of hands   Standing Unsupported with Eyes Closed Able to stand 10 seconds safely   Standing Unsupported with Feet Together Able to place feet together independently and stand 1 minute safely   From Standing, Reach Forward with Outstretched Arm Can reach forward >12 cm safely (5)   From Standing Position, Pick up Object from Floor Able to pick up shoe safely and easily   From Standing Position, Turn to Look Behind Over each Shoulder Looks behind from both sides and weight shifts well   Turn 360 Degrees Able to turn 360 degrees safely in 4 seconds or less   Standing Unsupported, Alternately Place Feet on Step/Stool Able to stand independently and safely and complete 8 steps in 20 seconds   Standing Unsupported, One Foot in Front Able to plae foot ahead of the other independently and hold 30 seconds   Standing on One Leg Able to lift leg independently and hold equal to or more than 3 seconds   Total Score 52   Berg comment: 52-55 lower (> 25%) risk for falls   Functional Gait  Assessment  Gait Level Surface Walks 20 ft in less than 7 sec but greater than 5.5 sec, uses assistive  device, slower speed, mild gait deviations, or deviates 6-10 in outside of the 12 in walkway width.   Change in Gait Speed Able to smoothly change walking speed without loss of balance or gait deviation. Deviate no more than 6 in outside of the 12 in walkway width.   Gait with Horizontal Head Turns Performs head turns smoothly with slight change in gait velocity (eg, minor disruption to smooth gait path), deviates 6-10 in outside 12 in walkway width, or uses an assistive device.   Gait with Vertical Head Turns Performs task with slight change in gait velocity (eg, minor disruption to smooth gait path), deviates 6 - 10 in outside 12 in walkway width or uses assistive device   Gait and Pivot Turn Pivot turns safely within 3 sec and stops quickly with no loss of balance.   Step Over Obstacle Is able to step over 2 stacked shoe boxes taped together (9 in total height) without changing gait speed. No evidence of imbalance.   Gait with Narrow Base of Support Ambulates 4-7 steps.   Gait with Eyes Closed Walks 20 ft, slow speed, abnormal gait pattern, evidence for imbalance, deviates 10-15 in outside 12 in walkway width. Requires more than 9 sec to ambulate 20 ft.   Ambulating Backwards Walks 20 ft, slow speed, abnormal gait pattern, evidence for imbalance, deviates 10-15 in outside 12 in walkway width.   Steps Alternating feet, must use rail.   Total Score 20   FGA comment: 19-24 = medium risk fall    12/28/23: 5xSTS = 11.00 sec, <15 sec indicates decreased risk  for recurrent falls FGA = 23/30, 19-24 = medium risk fall    PATIENT SURVEYS:  ABC scale 1310 / 1600 = 81.9 %; >80% indicates a high level of physical functioning   TODAY'S TREATMENT:  01/12/24 Bike L3x26min  THERAPEUTIC ACTIVITIES: To improve functional performance.  Lateral lunges x 10 B Standing balance staggered foot on 2' book x 30' B ON airex:  Fwd step and reciprocal arm reach x 10 B  Lateral step and arm reach x 10 B Leg ext 15lb  x 10 BLE; B con/R/L ecc 5lb x 10  01/04/2024 - PN THERAPEUTIC EXERCISE: To improve strength and endurance.  Demonstration, verbal and tactile cues throughout for technique. Rec Bike - L3 x 6 min Seated hip hinge L HS stretch x 30 Hooklying 3-way HS stretch with strap 3 x 30 (1 rep each with foot in neutral as well as LE ER & IR - best stretch felt in IR, therefore completed 2 extra reps) Hooklying HS stretch in neutral and LE IR with strap x 30 each following MT - pain resolved  MANUAL THERAPY: To promote normalized muscle tension, improved flexibility, and reduced pain utilizing connective tissue massage, therapeutic massage, and manual TP therapy.  STM/DTM and manual TPR to L distal biceps femoris  NEUROMUSCULAR RE-EDUCATION: To improve balance, coordination, kinesthesia, posture, and proprioception. Standing hip ABD with looped GTB at ankles 2 x 10 Standing hip extension with looped GTB at ankles 2 x 10 Seated alt hip ABD/ER with looped GTB at distal thighs 2 x 10 S/L R GTB clam 2 x 10   12/31/23 Nu-Step L1 x 6 min LE MMT Clock balance 12-6:00 x 10 BLE with intermittent UE support Step touch 9 step x 10 BLE intermittent UE support Step touch from airex 9 step x 2/10 BLE with CGA/minA  Standing Red TB 4 way hip x 10 BLE UE w/ support  Mini squats with 5 sec holds x 10   12/28/2023  THERAPEUTIC EXERCISE: To improve strength and endurance.  Demonstration, verbal and tactile cues throughout for technique. Rec Bike - L3 x 6 min  PHYSICAL PERFORMANCE TEST or MEASUREMENT: 5xSTS = 11.00 sec, <15 sec indicates decreased risk for recurrent falls FGA = 23/30, 19-24 = medium risk fall  Functional Gait  Assessment  Gait Level Surface Walks 20 ft in less than 7 sec but greater than 5.5 sec, uses assistive device, slower speed, mild gait deviations, or deviates 6-10 in outside of the 12 in walkway width.   Change in Gait Speed Able to smoothly change walking speed without loss of balance  or gait deviation. Deviate no more than 6 in outside of the 12 in walkway width.   Gait with Horizontal Head Turns Performs head turns smoothly with slight change in gait velocity (eg, minor disruption to smooth gait path), deviates 6-10 in outside 12 in walkway width, or uses an assistive device.   Gait with Vertical Head Turns Performs head turns with no change in gait. Deviates no more than 6 in outside 12 in walkway width.   Gait and Pivot Turn Pivot turns safely within 3 sec and stops quickly with no loss of balance.   Step Over Obstacle Is able to step over one shoe box (4.5 in total height) without changing gait speed. No evidence of imbalance.   Gait with Narrow Base of Support Ambulates 7-9 steps.   Gait with Eyes Closed Walks 20 ft, uses assistive device, slower speed, mild gait deviations, deviates 6-10 in  outside 12 in walkway width. Ambulates 20 ft in less than 9 sec but greater than 7 sec.   Ambulating Backwards Walks 20 ft, uses assistive device, slower speed, mild gait deviations, deviates 6-10 in outside 12 in walkway width.   Steps Alternating feet, must use rail.   Total Score 23   FGA comment: 19-24 = medium risk fall       THERAPEUTIC ACTIVITIES: To improve functional performance.  Demonstration, verbal and tactile cues throughout for technique. Worked on floor to/from stand transfers via 1/2 kneel on R (on Airex pad) with L foot forward, pushing up from R hand on floor and L hand on knee x 1 rep Fwd step-up to 6 step x 10 B Fwd step-up to 8 step x 10 B  NEUROMUSCULAR RE-EDUCATION: To improve balance, coordination, kinesthesia, posture, proprioception, and reduce fall risk. Alt toe clears with light toe tap to 9 step x 20 SLS + 5-way (1/2 circle) toe tap to colored dots SLS + 3-way hip swing (flexion, ABD, extension) x 3 cycles w/o touching down, 1 pole A for balance    12/24/2023  THERAPEUTIC EXERCISE: To improve strength and endurance.  Demonstration, verbal and  tactile cues throughout for technique.  Rec Bike - L2 x 6 min Standing HS curl with RTB at ankles x 15 bil  THERAPEUTIC ACTIVITIES: To improve functional performance.  Demonstration, verbal and tactile cues throughout for technique. Worked on floor to/from stand transfers via 1/2 kneel on R (on Airex pad) with L foot forward, pushing up from R hand on floor and L hand on knee x 2 reps - increased effort necessary but pt verbalizing good understanding of technique  NEUROMUSCULAR RE-EDUCATION: To improve balance, coordination, kinesthesia, posture, proprioception, and reduce fall risk. SLS + 5-way (1/2 circle) toe tap to colored dots SLS + 2-way hip swing (flexion & extension) x 5 cycles w/o touching down, 2 pole A for balance  SLS + 3-way hip swing (flexion, ABD, extension) x 5 cycles w/o touching down, 2 pole A for balance  Alt toe clears with light toe tap to 9 step x 10 Alt toe clears with light toe tap to 9 step from standing on Airex pad x 10   12/21/23 Nustep level 5 x 6 minutes Supine HS stretch x 30 strap Supine piriformis stretch x30 Supine quad stretch x 30  Supine ITB stretch x 30: S/L clamshell RTB 15x3 B Standing:  Hip extension 3lb x 10 B  Hip abduction 3lb x 10 B  Marching x 10 3lb B  HS curls RTB 2 x 10 B Education on fall risk   12/18/23 Nustep level 5 x 5 minutes Supine passive HS, piriformis, adductor stretches Supine bridges Supine green tband clamshells Side stepping Cone toe touch  On airex balance, head turns, eyes closed, ball toss On airex cone toe touches Backward walking Side step on and off airex Direction changes Star with colored dots PT calling out colors and her stepping toward SLS x 5 seconds each Talked about getting up off the floor   12/16/2023 THERAPEUTIC EXERCISE: To improve strength, endurance, and flexibility.  Demonstration, verbal and tactile cues throughout for technique.  Rec Bike - L1 x 6 min Seated hamstring curls with  looped RTB at ankles 2 x 10 bil S/L RTB clam 2 x 10 Hooklying L HS stretch with strap x 30 Supine L HS stretch with strap x 30 Supine L ITB stretch with strap x 30 Hooklying L KTOS piriformis with  strap for added reach x 30 Mod thomas L quad/hip flexor stretch with strap x 30  NEUROMUSCULAR RE-EDUCATION: To improve balance, coordination, kinesthesia, posture, and proprioception.  Standing alt hip abduction with looped RTB at thighs x 10 bil Standing alt hip abduction with looped RTB at ankles x 10 bil Standing alt hip extension with looped RTB at ankles x 10 bil Standing alt hip flexion march with looped RTB at midfeet x 10 bil   PATIENT EDUCATION:  Education details: HEP progression - GTB for hip strengthening  Person educated: Patient Education method: Programmer, multimedia, Demonstration, and Verbal cues Education comprehension: verbalized understanding, returned demonstration, verbal cues required, and needs further education  HOME EXERCISE PROGRAM: Access Code: [email or web address] URL: [email or web address] Date: 12/21/2023 Prepared by: Keita Demarco  Exercises - Supine Hamstring Stretch with Strap  - 1-2 x daily - 7 x weekly - 3 reps - 30 sec hold - Supine Iliotibial Band Stretch with Strap  - 1-2 x daily - 7 x weekly - 3 reps - 30 sec hold - Supine Piriformis Stretch with Foot on Ground  - 1-2 x daily - 7 x weekly - 3 reps - 30 sec hold - Hip Flexor Stretch with Strap on Table  - 1-2 x daily - 7 x weekly - 3 reps - 30 sec hold - Clam with Resistance  - 1 x daily - 5-7 x weekly - 2 sets - 10 reps - 3-5 sec hold - Standing Hip Abduction with Resistance at Ankles and Counter Support  - 1 x daily - 5-7 x weekly - 2 sets - 10 reps - 3 sec hold - Standing Hip Extension with Resistance at Ankles and Counter Support  - 1 x daily - 5-7 x weekly - 2 sets - 10 reps - 3 sec hold - Marching with Resistance  - 1 x daily - 5-7 x weekly - 2 sets - 10 reps - 3 sec hold - Standing Hamstring  Curl with Resistance  - 1 x daily - 5-7 x weekly - 2 sets - 10 reps  Patient Education - What You Can Do to Prevent Falls   ASSESSMENT:  CLINICAL IMPRESSION: Worked on functional strengthening and performance with daily activities. Incorporated balance activities as well. Pt more unsteady with support on LLE only. Good response to treatment. She will benefit from continued skilled PT to address ongoing strength and balance deficits to improve mobility and activity tolerance with decreased pain interference and decreased risk for falls.   From EVAL: Angelica Davis is a 83 y.o. female who was referred to physical therapy for evaluation and treatment for fatigue with associated LE weakness.  Patient presents with physical impairments of impaired activity tolerance, impaired standing balance, impaired ambulation, and decreased safety awareness impacting safe and independent functional mobility.  Examination revealed gait speed of 3.67 ft/sec, (2.62 ft/sec is needed for community access) and TUG of 9.28 sec (>13.5 sec indicates increased risk for falls) WNL however increased risk for falls and functional decline as evidenced by the following objective test measures:  5xSTS of 17.03 sec (>15 sec indicates increased risk for falls and decreased BLE power). Further assessment of Berg, FGA and 6 minute walk test to be completed next visit.  Parissa will benefit from skilled PT to address above deficits to improve mobility and activity tolerance to help reach the maximal level of functional independence and mobility. Patient demonstrates understanding of this POC and is in agreement with this plan.   OBJECTIVE IMPAIRMENTS: Abnormal gait,  decreased activity tolerance, decreased balance, decreased mobility, difficulty walking, decreased strength, decreased safety awareness, increased fascial restrictions, impaired perceived functional ability, increased muscle spasms, improper body mechanics, postural dysfunction,  and pain.   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   REHAB POTENTIAL: Good  CLINICAL DECISION MAKING: Evolving/moderate complexity  EVALUATION COMPLEXITY: Moderate   GOALS: Goals reviewed with patient? Yes  SHORT TERM GOALS: Target date: 01/04/2024  Patient will be independent with initial HEP to improve outcomes and carryover.  Baseline: Initial HEP provided on eval 12/10/23 - Pt has not yet attempted initial HEP 12/16/23 - HEP reviewed and updated Goal status: MET - 12/24/23  2.  Patient will be educated on strategies to decrease risk of falls.  Baseline:  Goal status: MET - 12/21/23  3.  Patient will improve 5xSTS time to </= 15 seconds for improved efficiency and safety with transfers. Baseline: 17.03 sec Goal status: MET - 12/28/23 - 11.00 sec  LONG TERM GOALS: Target date: 02/01/2024  Patient will be independent with advanced/ongoing HEP to facilitate ability to maintain/progress functional gains from skilled physical therapy services. Baseline:  Goal status: IN PROGRESS - 01/04/24  2.  Patient will be able to ambulate 600' with or w/o LRAD on variable surfaces including curb and stair negotiation with single rail with good safety to access community.  Baseline:  Goal status: IN PROGRESS  3. Patient will demonstrate improved B LE strength to >/= 4 to 4+/5 for improved stability and ease of mobility . Baseline: Refer to above LE MMT table Goal status: IN PROGRESS - 12/31/23 see MMT chart  4.   Patient will improve Berg score by at least 8 points or to >/= 52/56 to improve safety and stability with ADLs in standing and reduce risk for falls. (MCID= 8 points)  Baseline: 52/26 (12/10/23) Goal status: MET - 12/10/23  5. Patient will improve FGA score by at least 4 points or to at least 21/30 to improve gait stability and reduce risk for falls. Baseline: 20/30 (12/10/23) Goal status: IN PROGRESS - 12/28/23 - 23/30   PLAN:  PT FREQUENCY: 2x/week  PT DURATION: 8 weeks  PLANNED INTERVENTIONS: 02835- PT Re-evaluation, 97750- Physical Performance Testing, 97110-Therapeutic exercises, 97530- Therapeutic activity, 97112- Neuromuscular re-education, 97535- Self Care, 02859- Manual therapy, [phone]- Gait training, [phone]- Electrical stimulation (unattended), 97016- Vasopneumatic device, 97035- Ultrasound, 02966- Ionotophoresis 4mg /ml Dexamethasone , Patient/Family education, Balance training, Stair training, Taping, Dry Needling, Cryotherapy, and Moist heat  PLAN FOR NEXT SESSION: progress knee strengthening; progress balance training, esp SLS;    Sol LITTIE Gaskins, PTA 01/12/2024, 4:02 PM   Date of referral: 10/27/23 Referring provider: Daryl Setter, NP Referring diagnosis? R53.83 (ICD-10-CM) - Fatigue, unspecified type  Treatment diagnosis? (if different than referring diagnosis)  Muscle weakness (generalized)  Unsteadiness on feet  History of falling  Difficulty in walking, not elsewhere classified  What was this (referring dx) caused by? Fall, Ongoing Issue, and Unspecified  Nature of Condition: Chronic (continuous duration > 3 months)   Laterality: Both  Current Functional Measure Score: Other ABC scale 1380 / 1600 = 86.3 %   Objective measurements identify impairments when they are compared to normal values, the uninvolved  extremity, and prior level of function.  [x]  Yes  []  No  Objective assessment of functional ability: Moderate functional limitations   Briefly describe  symptoms: Patient presents with physical impairments of impaired activity tolerance, impaired standing balance, impaired ambulation, and decreased safety awareness impacting safe and independent functional mobility.  Examination revealed increased risk for falls and functional decline as evidenced by the following objective test measures: 5xSTS of 17.03 sec (>15 sec indicates increased risk for falls and decreased BLE power). Further assessment of Berg, FGA and 6 minute walk test to be completed next visit.  How did symptoms start: gradual onset  Average pain intensity:  Last 24 hours: 0/10  Past week: 0/10  How often does the pt experience symptoms? Intermittently  How much have the symptoms interfered with usual daily activities? A little bit  How has condition changed since care began at this facility? Better  In general, how is the patients overall health? Very Good  Onset date: ~1 year   BACK PAIN (STarT Back Screening Tool) - (When applicable): N/A  Has your back pain spread down your leg(s) at sometime in the last 2 weeks? []  Yes   []  No Have you had pain in the shoulder or neck at sometime in the past 2 weeks? []  Yes   []  No Have you only walked short distances because of your back pain? []  Yes   []  No In the past 2 weeks, have you dressed more slowly than usual because of your back pain? []  Yes   []  No Do you think it is not really safe for person with a condition like yours to be physically active? []  Yes   []  No Have worrying thoughts been going through your mind a lot of the time? []  Yes   []  No Do you feel that your back pain is terrible and it is never going to get any better? []  Yes   []  No In general, have you stopped enjoying all the things you usually enjoy? []  Yes   []  No Overall, how bothersome has your back pain been in the last 2 weeks? []  Not at all   []  Slightly     []  Moderate   []  Very much     []  Extremely

## 2024-01-15 ENCOUNTER — Ambulatory Visit: Admitting: Physical Therapy

## 2024-01-15 ENCOUNTER — Encounter: Payer: Self-pay | Admitting: Physical Therapy

## 2024-01-15 DIAGNOSIS — R2681 Unsteadiness on feet: Secondary | ICD-10-CM

## 2024-01-15 DIAGNOSIS — M6281 Muscle weakness (generalized): Secondary | ICD-10-CM | POA: Diagnosis not present

## 2024-01-15 DIAGNOSIS — Z9181 History of falling: Secondary | ICD-10-CM

## 2024-01-15 DIAGNOSIS — R262 Difficulty in walking, not elsewhere classified: Secondary | ICD-10-CM

## 2024-01-15 NOTE — Therapy (Signed)
 OUTPATIENT PHYSICAL THERAPY TREATMENT      Patient Name: Angelica Davis MRN: [id number] DOB:1941/06/30, 83 y.o., female Today's Date: 01/15/2024   END OF SESSION:  PT End of Session - 01/15/24 0938     Visit Number 11    Date for PT Re-Evaluation 02/01/24    Authorization Type [employer]    Authorization Time Period 01/04/24-02/01/24    Authorization - Visit Number 2    Authorization - Number of Visits 4    Progress Note Due on Visit 19   PN on visit #9 - 01/04/24   PT Start Time 0938    PT Stop Time 1024    PT Time Calculation (min) 46 min    Activity Tolerance Patient tolerated treatment well    Behavior During Therapy [facility] for tasks assessed/performed                  Past Medical History:  Diagnosis Date   Arthritis    Cataract    bilateral, has had removed   Colitis    Diverticulosis    History of blood transfusion    after birth of child   Hx of colonic polyps - adenomas 08/16/2002   Hyperlipidemia    Skin cancer    skin- On bridge of nose, basal cell carcinoma per patient   Past Surgical History:  Procedure Laterality Date   CATARACT EXTRACTION Bilateral 01/13/2019   COLONOSCOPY  last 05/12/2014   polyps removed 2015, 2020 TA x1   KNEE ARTHROSCOPY Left 06/2007   Dr. Jane   MOHS SURGERY     bridge of nose x 2 (unsure of dates ?2021 and 2018)   PARTIAL KNEE ARTHROPLASTY Right 08/27/2021   Procedure: UNICOMPARTMENTAL KNEE;  Surgeon: Josefina Chew, MD;  Location: [facility];  Service: Orthopedics;  Laterality: Right;   TONSILLECTOMY     TOTAL KNEE ARTHROPLASTY Left 12/2008   Dr. Jane   Patient Active Problem List   Diagnosis Date Noted   Dyspnea on exertion 10/27/2023   Fatigue 10/27/2023   Preventative health care 10/27/2023   Skin cancer 09/17/2022   Urinary incontinence 09/17/2022   Cough 09/17/2022   S/P right unicompartmental knee replacement 08/27/2021   Osteoarthritis of right knee 08/06/2021   Pain of left hand 11/16/2019   Trigger  finger of left hand 11/16/2019   Hyperlipidemia 09/04/2015   Hx of colonic polyps - adenomas 08/16/2002    PCP: Daryl Setter, NP   REFERRING PROVIDER: Daryl Setter, NP   REFERRING DIAG: R53.83 (ICD-10-CM) - Fatigue, unspecified type   C/o LE weakness, please evaluate for strength/balance.   THERAPY DIAG:  Muscle weakness (generalized)  Unsteadiness on feet  History of falling  Difficulty in walking, not elsewhere classified  RATIONALE FOR EVALUATION AND TREATMENT: Rehabilitation  ONSET DATE: up to 1 yr  NEXT MD VISIT: 11/15/2024   SUBJECTIVE:  SUBJECTIVE STATEMENT: Pt denies pain but states more stiffness.   EVAL: Pt reports her house is [place] a [place] - every time she has to take the trash out or go for the mail she finds herself feeling weak and out of breath.  She volunteers at [employer] where she slid/fell out of a chair to the floor ~1 month ago and could not get up.  PAIN: Are you having pain? No  PERTINENT HISTORY:  Arthritis, R unicompartmental knee replacement 08/2021, L TKA 12/2008, colitis, diverticulosis, HLD, skin cancer, DOE, fatigue, urinary incontinence  PRECAUTIONS: Fall  RED FLAGS: None  WEIGHT BEARING RESTRICTIONS: No  FALLS:  Has patient fallen in last 6 months? Yes. Number of falls 1 - fall out of a chair/stool   LIVING ENVIRONMENT: Lives with: lives alone Lives in: House/apartment Stairs: Yes: External: 5-6 steps; on right going up, on left going up, and can reach both Has following equipment at home: Single point cane, Walker - 2 wheeled, and Ramped entry  OCCUPATION: Retired  PLOF: Independent and Leisure: no regular exercise, Comptroller at [employer]   PATIENT GOALS: To be able t walk/move around confidently.   OBJECTIVE:  (objective measures completed at initial evaluation unless otherwise dated)  DIAGNOSTIC FINDINGS:  N/A  COGNITION: Overall cognitive status: Within functional limits for tasks assessed   SENSATION: WFL  COORDINATION: LE gross motor WFL  POSTURE:  rounded shoulders, forward head, and flexed trunk   MUSCLE LENGTH: Hamstrings: mild/mod tight B ITB: mild/mod tight B Piriformis: mod tight B Hip flexors: mod tight B Quads: mild/mod tight B Heelcord: mild/mod tight B  LOWER EXTREMITY ROM:    B knee grossly 0-115  LOWER EXTREMITY MMT:    MMT Right eval Left eval R 12/31/23 L 12/31/23  Hip flexion 4- 4- 4 4+  Hip extension 4- 4- 4- 4+  Hip abduction 4- 4- 4 4+  Hip adduction 4- 4- 5 4+  Hip internal rotation 4- 4- 4 4+  Hip external rotation 4- 4 4- 4  Knee flexion 3+ 3+ 4+ 4+  Knee extension 4+ 4+ 4+ 4  Ankle dorsiflexion 4 4 5 5   Ankle plantarflexion      Ankle inversion      Ankle eversion      (Blank rows = not tested)  BED MOBILITY:  Independent  TRANSFERS: Assistive device utilized: None  Sit to stand: Complete Independence Stand to sit: Complete Independence Chair to chair: Complete Independence Floor: NT  GAIT: Distance walked: Clinic distances Assistive device utilized: None Level of assistance: Complete Independence Gait pattern: WFL   FUNCTIONAL TESTS:  5 times sit to stand: 17.03 sec Timed up and go (TUG): 9.28 sec 6 minute walk test: see below (12/10/23) 10 meter walk test: 8.93 sec; Gait speed = 3.67 ft/sec Berg Balance Scale:  52/56 (12/10/23); 52-55/56 indicates lower (> 25%) risk for falls Functional gait assessment: 20/30 (12/10/23); 19-24/30 indicates medium risk for falls  12/10/23: 6 Minute Walk- Baseline  BP (mmHg) 162/80   HR (bpm) 77   02 Sat (%RA) 96 %   Modified Borg Scale for Dyspnea 0- Nothing at all   Perceived Rate of Exertion (Borg) 6-   6 Minute walk- Post Test  BP (mmHg) 168/78   HR (bpm) 97   02 Sat (%RA) 97 %   Modified  Borg Scale for Dyspnea 3- Moderate shortness of breath or breathing difficulty   Perceived Rate of Exertion (Borg) 12-   6 minute walk test results  Aerobic Endurance Distance Walked 1144', Normal range for females 80-90: 730' - 1840'  Standardized Balance Assessment  Standardized Balance Assessment Berg Balance Test   Berg Balance Test  Sit to Stand Able to stand without using hands and stabilize independently   Standing Unsupported Able to stand safely 2 minutes   Sitting with Back Unsupported but Feet Supported on Floor or Stool Able to sit safely and securely 2 minutes   Stand to Sit Sits safely with minimal use of hands   Transfers Able to transfer safely, minor use of hands   Standing Unsupported with Eyes Closed Able to stand 10 seconds safely   Standing Unsupported with Feet Together Able to place feet together independently and stand 1 minute safely   From Standing, Reach Forward with Outstretched Arm Can reach forward >12 cm safely (5)   From Standing Position, Pick up Object from Floor Able to pick up shoe safely and easily   From Standing Position, Turn to Look Behind Over each Shoulder Looks behind from both sides and weight shifts well   Turn 360 Degrees Able to turn 360 degrees safely in 4 seconds or less   Standing Unsupported, Alternately Place Feet on Step/Stool Able to stand independently and safely and complete 8 steps in 20 seconds   Standing Unsupported, One Foot in Front Able to plae foot ahead of the other independently and hold 30 seconds   Standing on One Leg Able to lift leg independently and hold equal to or more than 3 seconds   Total Score 52   Berg comment: 52-55 lower (> 25%) risk for falls   Functional Gait  Assessment  Gait Level Surface Walks 20 ft in less than 7 sec but greater than 5.5 sec, uses assistive device, slower speed, mild gait deviations, or deviates 6-10 in outside of the 12 in walkway width.   Change in Gait Speed Able to smoothly change  walking speed without loss of balance or gait deviation. Deviate no more than 6 in outside of the 12 in walkway width.   Gait with Horizontal Head Turns Performs head turns smoothly with slight change in gait velocity (eg, minor disruption to smooth gait path), deviates 6-10 in outside 12 in walkway width, or uses an assistive device.   Gait with Vertical Head Turns Performs task with slight change in gait velocity (eg, minor disruption to smooth gait path), deviates 6 - 10 in outside 12 in walkway width or uses assistive device   Gait and Pivot Turn Pivot turns safely within 3 sec and stops quickly with no loss of balance.   Step Over Obstacle Is able to step over 2 stacked shoe boxes taped together (9 in total height) without changing gait speed. No evidence of imbalance.   Gait with Narrow Base of Support Ambulates 4-7 steps.   Gait with Eyes Closed Walks 20 ft, slow speed, abnormal gait pattern, evidence for imbalance, deviates 10-15 in outside 12 in walkway width. Requires more than 9 sec to ambulate 20 ft.   Ambulating Backwards Walks 20 ft, slow speed, abnormal gait pattern, evidence for imbalance, deviates 10-15 in outside 12 in walkway width.   Steps Alternating feet, must use rail.   Total Score 20   FGA comment: 19-24 = medium risk fall    12/28/23: 5xSTS = 11.00 sec, <15 sec indicates decreased risk for recurrent falls FGA = 23/30, 19-24 = medium risk fall    PATIENT SURVEYS:  ABC scale 1310 / 1600 = 81.9 %; >  80% indicates a high level of physical functioning   TODAY'S TREATMENT:   01/15/2024  THERAPEUTIC EXERCISE: To improve strength and endurance.  Demonstration, verbal and tactile cues throughout for technique.  NuStep - L5 x 6 min - UE/LE  NEUROMUSCULAR RE-EDUCATION: To improve balance, coordination, kinesthesia, posture, proprioception, and reduce fall risk. L SLS + R hip ABD/ER isometric into counter 10 x 3-5 - minimal UE support on counter for balance  SLS + opp LE GTB  4-way SLR x 10 - occasional UE support on back of chair for balance Corner balance progression with tandem stance (bilaterally) on firm surface with arms at sides            Eyes open: static stance x 30 sec horiz head turns x 5   01/12/24 Bike L3x44min  THERAPEUTIC ACTIVITIES: To improve functional performance.  Lateral lunges x 10 B Standing balance staggered foot on 2' book x 30' B ON airex:  Fwd step and reciprocal arm reach x 10 B  Lateral step and arm reach x 10 B Leg ext 15lb x 10 BLE; B con/R/L ecc 5lb x 10   01/04/2024 - PN THERAPEUTIC EXERCISE: To improve strength and endurance.  Demonstration, verbal and tactile cues throughout for technique. Rec Bike - L3 x 6 min Seated hip hinge L HS stretch x 30 Hooklying 3-way HS stretch with strap 3 x 30 (1 rep each with foot in neutral as well as LE ER & IR - best stretch felt in IR, therefore completed 2 extra reps) Hooklying HS stretch in neutral and LE IR with strap x 30 each following MT - pain resolved  MANUAL THERAPY: To promote normalized muscle tension, improved flexibility, and reduced pain utilizing connective tissue massage, therapeutic massage, and manual TP therapy.  STM/DTM and manual TPR to L distal biceps femoris  NEUROMUSCULAR RE-EDUCATION: To improve balance, coordination, kinesthesia, posture, and proprioception. Standing hip ABD with looped GTB at ankles 2 x 10 Standing hip extension with looped GTB at ankles 2 x 10 Seated alt hip ABD/ER with looped GTB at distal thighs 2 x 10 S/L R GTB clam 2 x 10   12/31/23 Nu-Step L1 x 6 min LE MMT Clock balance 12-6:00 x 10 BLE with intermittent UE support Step touch 9 step x 10 BLE intermittent UE support Step touch from airex 9 step x 2/10 BLE with CGA/minA  Standing Red TB 4 way hip x 10 BLE UE w/ support  Mini squats with 5 sec holds x 10   12/28/2023  THERAPEUTIC EXERCISE: To improve strength and endurance.  Demonstration, verbal and tactile cues throughout  for technique. Rec Bike - L3 x 6 min  PHYSICAL PERFORMANCE TEST or MEASUREMENT: 5xSTS = 11.00 sec, <15 sec indicates decreased risk for recurrent falls FGA = 23/30, 19-24 = medium risk fall  Functional Gait  Assessment  Gait Level Surface Walks 20 ft in less than 7 sec but greater than 5.5 sec, uses assistive device, slower speed, mild gait deviations, or deviates 6-10 in outside of the 12 in walkway width.   Change in Gait Speed Able to smoothly change walking speed without loss of balance or gait deviation. Deviate no more than 6 in outside of the 12 in walkway width.   Gait with Horizontal Head Turns Performs head turns smoothly with slight change in gait velocity (eg, minor disruption to smooth gait path), deviates 6-10 in outside 12 in walkway width, or uses an assistive device.   Gait with Vertical  Head Turns Performs head turns with no change in gait. Deviates no more than 6 in outside 12 in walkway width.   Gait and Pivot Turn Pivot turns safely within 3 sec and stops quickly with no loss of balance.   Step Over Obstacle Is able to step over one shoe box (4.5 in total height) without changing gait speed. No evidence of imbalance.   Gait with Narrow Base of Support Ambulates 7-9 steps.   Gait with Eyes Closed Walks 20 ft, uses assistive device, slower speed, mild gait deviations, deviates 6-10 in outside 12 in walkway width. Ambulates 20 ft in less than 9 sec but greater than 7 sec.   Ambulating Backwards Walks 20 ft, uses assistive device, slower speed, mild gait deviations, deviates 6-10 in outside 12 in walkway width.   Steps Alternating feet, must use rail.   Total Score 23   FGA comment: 19-24 = medium risk fall       THERAPEUTIC ACTIVITIES: To improve functional performance.  Demonstration, verbal and tactile cues throughout for technique. Worked on floor to/from stand transfers via 1/2 kneel on R (on Airex pad) with L foot forward, pushing up from R hand on floor and L hand on  knee x 1 rep Fwd step-up to 6 step x 10 B Fwd step-up to 8 step x 10 B  NEUROMUSCULAR RE-EDUCATION: To improve balance, coordination, kinesthesia, posture, proprioception, and reduce fall risk. Alt toe clears with light toe tap to 9 step x 20 SLS + 5-way (1/2 circle) toe tap to colored dots SLS + 3-way hip swing (flexion, ABD, extension) x 3 cycles w/o touching down, 1 pole A for balance    12/24/2023  THERAPEUTIC EXERCISE: To improve strength and endurance.  Demonstration, verbal and tactile cues throughout for technique.  Rec Bike - L2 x 6 min Standing HS curl with RTB at ankles x 15 bil  THERAPEUTIC ACTIVITIES: To improve functional performance.  Demonstration, verbal and tactile cues throughout for technique. Worked on floor to/from stand transfers via 1/2 kneel on R (on Airex pad) with L foot forward, pushing up from R hand on floor and L hand on knee x 2 reps - increased effort necessary but pt verbalizing good understanding of technique  NEUROMUSCULAR RE-EDUCATION: To improve balance, coordination, kinesthesia, posture, proprioception, and reduce fall risk. SLS + 5-way (1/2 circle) toe tap to colored dots SLS + 2-way hip swing (flexion & extension) x 5 cycles w/o touching down, 2 pole A for balance  SLS + 3-way hip swing (flexion, ABD, extension) x 5 cycles w/o touching down, 2 pole A for balance  Alt toe clears with light toe tap to 9 step x 10 Alt toe clears with light toe tap to 9 step from standing on Airex pad x 10   12/21/23 Nustep level 5 x 6 minutes Supine HS stretch x 30 strap Supine piriformis stretch x30 Supine quad stretch x 30  Supine ITB stretch x 30: S/L clamshell RTB 15x3 B Standing:  Hip extension 3lb x 10 B  Hip abduction 3lb x 10 B  Marching x 10 3lb B  HS curls RTB 2 x 10 B Education on fall risk   12/18/23 Nustep level 5 x 5 minutes Supine passive HS, piriformis, adductor stretches Supine bridges Supine green tband clamshells Side  stepping Cone toe touch  On airex balance, head turns, eyes closed, ball toss On airex cone toe touches Backward walking Side step on and off airex Direction changes Star with  colored dots PT calling out colors and her stepping toward SLS x 5 seconds each Talked about getting up off the floor   12/16/2023 THERAPEUTIC EXERCISE: To improve strength, endurance, and flexibility.  Demonstration, verbal and tactile cues throughout for technique.  Rec Bike - L1 x 6 min Seated hamstring curls with looped RTB at ankles 2 x 10 bil S/L RTB clam 2 x 10 Hooklying L HS stretch with strap x 30 Supine L HS stretch with strap x 30 Supine L ITB stretch with strap x 30 Hooklying L KTOS piriformis with strap for added reach x 30 Mod thomas L quad/hip flexor stretch with strap x 30  NEUROMUSCULAR RE-EDUCATION: To improve balance, coordination, kinesthesia, posture, and proprioception.  Standing alt hip abduction with looped RTB at thighs x 10 bil Standing alt hip abduction with looped RTB at ankles x 10 bil Standing alt hip extension with looped RTB at ankles x 10 bil Standing alt hip flexion march with looped RTB at midfeet x 10 bil   PATIENT EDUCATION:  Education details: HEP progression - SLS + GTB 4-way SLR & corner balance in tandem stance   Person educated: Patient Education method: Programmer, multimedia, Demonstration, Verbal cues, and Handouts Education comprehension: verbalized understanding, returned demonstration, verbal cues required, and needs further education  HOME EXERCISE PROGRAM: Access Code: [email or web address] URL: [email or web address] Date: 01/15/2024 Prepared by: Elijah Hidden  Exercises - Supine Hamstring Stretch with Strap  - 1-2 x daily - 7 x weekly - 3 reps - 30 sec hold - Supine Iliotibial Band Stretch with Strap  - 1-2 x daily - 7 x weekly - 3 reps - 30 sec hold - Supine Piriformis Stretch with Foot on Ground  - 1-2 x daily - 7 x weekly - 3 reps - 30 sec hold - Hip  Flexor Stretch with Strap on Table  - 1-2 x daily - 7 x weekly - 3 reps - 30 sec hold - Clam with Resistance  - 1 x daily - 5-7 x weekly - 2 sets - 10 reps - 3-5 sec hold - Standing Hip Abduction with Resistance at Ankles and Counter Support  - 1 x daily - 5-7 x weekly - 2 sets - 10 reps - 3 sec hold - Standing Hip Extension with Resistance at Ankles and Counter Support  - 1 x daily - 5-7 x weekly - 2 sets - 10 reps - 3 sec hold - Marching with Resistance  - 1 x daily - 5-7 x weekly - 2 sets - 10 reps - 3 sec hold - Standing Hamstring Curl with Resistance  - 1 x daily - 5-7 x weekly - 2 sets - 10 reps - Standing Hip Flexion with Anchored Resistance and Chair Support  - 1 x daily - 3-4 x weekly - 2 sets - 10 reps - 3 sec hold - Standing Hip Adduction with Anchored Resistance  - 1 x daily - 3-4 x weekly - 2 sets - 10 reps - 3 sec hold - Standing Hip Extension with Anchored Resistance  - 1 x daily - 3-4 x weekly - 2 sets - 10 reps - 3 sec hold - Standing Hip Abduction with Anchored Resistance  - 1 x daily - 3-4 x weekly - 2 sets - 10 reps - 3 sec hold - Semi-Tandem Corner Balance: Eyes Open With Head Turns  - 1 x daily - 3-4 x weekly - 2 sets - 5 reps  Patient Education - What You Can Do to Prevent  Falls   ASSESSMENT:  CLINICAL IMPRESSION: Angelica Davis continues to state her primary concern is her balance, therefore progressed lower extremity strengthening focusing on SLS balance to GTB 4-way SLR in standing using occasional/intermittent UE assist as little as necessary - patient to replace 3-way hip strengthening with looped GTB with these exercises in HEP.  Introduced corner balance in tandem stance adding horizontal head motions to address areas of continued deficit on recent FGA testing, providing HEP instructions for these as well.  Angelica Davis will benefit from continued skilled PT to address ongoing strength and balance deficits to improve mobility and activity tolerance with decreased pain interference  and decreased risk for falls.   From EVAL: Angelica Davis is a 83 y.o. female who was referred to physical therapy for evaluation and treatment for fatigue with associated LE weakness.  Patient presents with physical impairments of impaired activity tolerance, impaired standing balance, impaired ambulation, and decreased safety awareness impacting safe and independent functional mobility.  Examination revealed gait speed of 3.67 ft/sec, (2.62 ft/sec is needed for community access) and TUG of 9.28 sec (>13.5 sec indicates increased risk for falls) WNL however increased risk for falls and functional decline as evidenced by the following objective test measures:  5xSTS of 17.03 sec (>15 sec indicates increased risk for falls and decreased BLE power). Further assessment of Berg, FGA and 6 minute walk test to be completed next visit.  Angelica Davis will benefit from skilled PT to address above deficits to improve mobility and activity tolerance to help reach the maximal level of functional independence and mobility. Patient demonstrates understanding of this POC and is in agreement with this plan.   OBJECTIVE IMPAIRMENTS: Abnormal gait, decreased activity tolerance, decreased balance, decreased mobility, difficulty walking, decreased strength, decreased safety awareness, increased fascial restrictions, impaired perceived functional ability, increased muscle spasms, improper body mechanics, postural dysfunction, and pain.   ACTIVITY LIMITATIONS: carrying, lifting, bending, standing, squatting, stairs, transfers, and locomotion level  PARTICIPATION LIMITATIONS: meal prep, cleaning, laundry, shopping, and community activity  PERSONAL FACTORS: Age, Fitness, Past/current experiences, Time since onset of injury/illness/exacerbation, and 3+ comorbidities: Arthritis, R unicompartmental knee replacement 08/2021, L TKA 12/2008, colitis, diverticulosis, HLD, skin cancer, DOE, fatigue, urinary incontinence are also affecting  patient's functional outcome.   REHAB POTENTIAL: Good  CLINICAL DECISION MAKING: Evolving/moderate complexity  EVALUATION COMPLEXITY: Moderate   GOALS: Goals reviewed with patient? Yes  SHORT TERM GOALS: Target date: 01/04/2024  Patient will be independent with initial HEP to improve outcomes and carryover.  Baseline: Initial HEP provided on eval 12/10/23 - Pt has not yet attempted initial HEP 12/16/23 - HEP reviewed and updated Goal status: MET - 12/24/23  2.  Patient will be educated on strategies to decrease risk of falls.  Baseline:  Goal status: MET - 12/21/23  3.  Patient will improve 5xSTS time to </= 15 seconds for improved efficiency and safety with transfers. Baseline: 17.03 sec Goal status: MET - 12/28/23 - 11.00 sec  LONG TERM GOALS: Target date: 02/01/2024  Patient will be independent with advanced/ongoing HEP to facilitate ability to maintain/progress functional gains from skilled physical therapy services. Baseline:  Goal status: IN PROGRESS - 01/15/24 - HEP progressed today  2.  Patient will be able to ambulate 600' with or w/o LRAD on variable surfaces including curb and stair negotiation with single rail with good safety to access community.  Baseline:  Goal status: IN PROGRESS  3. Patient will demonstrate improved B LE strength to >/= 4 to 4+/5 for improved  stability and ease of mobility . Baseline: Refer to above LE MMT table Goal status: IN PROGRESS - 12/31/23 see MMT chart  4.  Patient will improve Berg score by at least 8 points or to >/= 52/56 to improve safety and stability with ADLs in standing and reduce risk for falls. (MCID= 8 points)  Baseline: 52/26 (12/10/23) Goal status: MET - 12/10/23  5. Patient will improve FGA score by at least 4 points or to at least 21/30 to improve gait stability and reduce risk for falls. Baseline: 20/30 (12/10/23) Goal status: IN PROGRESS - 12/28/23 - 23/30   PLAN:  PT FREQUENCY: 2x/week  PT DURATION: 8 weeks  PLANNED  INTERVENTIONS: 97164- PT Re-evaluation, 97750- Physical Performance Testing, 97110-Therapeutic exercises, 97530- Therapeutic activity, 97112- Neuromuscular re-education, 97535- Self Care, [id number]- Manual therapy, [phone]- Gait training, [phone]- Electrical stimulation (unattended), 97016- Vasopneumatic device, 97035- Ultrasound, [id number]- Ionotophoresis 4mg /ml Dexamethasone , Patient/Family education, Balance training, Stair training, Taping, Dry Needling, Cryotherapy, and Moist heat  PLAN FOR NEXT SESSION: progress knee strengthening; progress balance training, esp SLS;    Elijah CHRISTELLA Hidden, PT 01/15/2024, 10:36 AM   Date of referral: 10/27/23 Referring provider: Daryl Setter, NP Referring diagnosis? R53.83 (ICD-10-CM) - Fatigue, unspecified type  Treatment diagnosis? (if different than referring diagnosis)  Muscle weakness (generalized)  Unsteadiness on feet  History of falling  Difficulty in walking, not elsewhere classified  What was this (referring dx) caused by? Fall, Ongoing Issue, and Unspecified  Nature of Condition: Chronic (continuous duration > 3 months)   Laterality: Both  Current Functional Measure Score: Other ABC scale 1380 / 1600 = 86.3 %   Objective measurements identify impairments when they are compared to normal values, the uninvolved extremity, and prior level of function.  [x]  Yes  []  No  Objective assessment of functional ability: Moderate functional limitations   Briefly describe symptoms: Patient presents with physical impairments of impaired activity tolerance, impaired standing balance, impaired ambulation, and decreased safety awareness impacting safe and independent functional mobility.  Examination revealed increased risk for falls and functional decline as evidenced by the following objective test measures: 5xSTS of 17.03 sec (>15 sec indicates increased risk for falls and decreased BLE power). Further assessment of Berg, FGA and 6 minute walk test to be  completed next visit.  How did symptoms start: gradual onset  Average pain intensity:  Last 24 hours: 0/10  Past week: 0/10  How often does the pt experience symptoms? Intermittently  How much have the symptoms interfered with usual daily activities? A little bit  How has condition changed since care began at this facility? Better  In general, how is the patients overall health? Very Good  Onset date: ~1 year   BACK PAIN (STarT Back Screening Tool) - (When applicable): N/A  Has your back pain spread down your leg(s) at sometime in the last 2 weeks? []  Yes   []  No Have you had pain in the shoulder or neck at sometime in the past 2 weeks? []  Yes   []  No Have you only walked short distances because of your back pain? []  Yes   []  No In the past 2 weeks, have you dressed more slowly than usual because of your back pain? []  Yes   []  No Do you think it is not really safe for person with a condition like yours to be physically active? []  Yes   []  No Have worrying thoughts been going through your mind a lot of the time? []  Yes   []   No Do you feel that your back pain is terrible and it is never going to get any better? []  Yes   []  No In general, have you stopped enjoying all the things you usually enjoy? []  Yes   []  No Overall, how bothersome has your back pain been in the last 2 weeks? []  Not at all   []  Slightly     []  Moderate   []  Very much     []  Extremely

## 2024-01-19 ENCOUNTER — Encounter: Payer: Self-pay | Admitting: Physical Therapy

## 2024-01-19 ENCOUNTER — Ambulatory Visit: Admitting: Physical Therapy

## 2024-01-19 DIAGNOSIS — R2681 Unsteadiness on feet: Secondary | ICD-10-CM

## 2024-01-19 DIAGNOSIS — R262 Difficulty in walking, not elsewhere classified: Secondary | ICD-10-CM

## 2024-01-19 DIAGNOSIS — M6281 Muscle weakness (generalized): Secondary | ICD-10-CM

## 2024-01-19 DIAGNOSIS — Z9181 History of falling: Secondary | ICD-10-CM

## 2024-01-19 NOTE — Therapy (Signed)
 OUTPATIENT PHYSICAL THERAPY TREATMENT      Patient Name: Angelica Davis MRN: [id number] DOB:1940/11/29, 83 y.o., female Today's Date: 01/19/2024   END OF SESSION:  PT End of Session - 01/19/24 0931     Visit Number 12    Date for PT Re-Evaluation 02/01/24    Authorization Type [employer] Medicare    Authorization Time Period 01/04/24-02/01/24    Authorization - Visit Number 3    Authorization - Number of Visits 4    Progress Note Due on Visit 19   PN on visit #9 - 01/04/24   PT Start Time 0931    PT Stop Time 1022    PT Time Calculation (min) 51 min    Activity Tolerance Patient tolerated treatment well    Behavior During Therapy [facility] for tasks assessed/performed                   Past Medical History:  Diagnosis Date   Arthritis    Cataract    bilateral, has had removed   Colitis    Diverticulosis    History of blood transfusion    after birth of child   Hx of colonic polyps - adenomas 08/16/2002   Hyperlipidemia    Skin cancer    skin- On bridge of nose, basal cell carcinoma per patient   Past Surgical History:  Procedure Laterality Date   CATARACT EXTRACTION Bilateral 01/13/2019   COLONOSCOPY  last 05/12/2014   polyps removed 2015, 2020 TA x1   KNEE ARTHROSCOPY Left 06/2007   Dr. Jane   MOHS SURGERY     bridge of nose x 2 (unsure of dates ?2021 and 2018)   PARTIAL KNEE ARTHROPLASTY Right 08/27/2021   Procedure: UNICOMPARTMENTAL KNEE;  Surgeon: Josefina Chew, MD;  Location: [facility];  Service: Orthopedics;  Laterality: Right;   TONSILLECTOMY     TOTAL KNEE ARTHROPLASTY Left 12/2008   Dr. Jane   Patient Active Problem List   Diagnosis Date Noted   Dyspnea on exertion 10/27/2023   Fatigue 10/27/2023   Preventative health care 10/27/2023   Skin cancer 09/17/2022   Urinary incontinence 09/17/2022   Cough 09/17/2022   S/P right unicompartmental knee replacement 08/27/2021   Osteoarthritis of right knee 08/06/2021   Pain of left hand 11/16/2019    Trigger finger of left hand 11/16/2019   Hyperlipidemia 09/04/2015   Hx of colonic polyps - adenomas 08/16/2002    PCP: Daryl Setter, NP   REFERRING PROVIDER: Daryl Setter, NP   REFERRING DIAG: R53.83 (ICD-10-CM) - Fatigue, unspecified type   C/o LE weakness, please evaluate for strength/balance.   THERAPY DIAG:  Muscle weakness (generalized)  Unsteadiness on feet  History of falling  Difficulty in walking, not elsewhere classified  RATIONALE FOR EVALUATION AND TREATMENT: Rehabilitation  ONSET DATE: up to 1 yr  NEXT MD VISIT: 11/15/2024   SUBJECTIVE:  SUBJECTIVE STATEMENT: Pt reports she is doing pretty good with her HEP - denies any concerns.   EVAL: Pt reports her [place] - every time she has to take the trash out or go for the mail she finds herself feeling weak and out of breath.  She volunteers at [employer] where she slid/fell out of a chair to the floor ~1 month ago and could not get up.  PAIN: Are you having pain? No  PERTINENT HISTORY:  Arthritis, R unicompartmental knee replacement 08/2021, L TKA 12/2008, colitis, diverticulosis, HLD, skin cancer, DOE, fatigue, urinary incontinence  PRECAUTIONS: Fall  RED FLAGS: None  WEIGHT BEARING RESTRICTIONS: No  FALLS:  Has patient fallen in last 6 months? Yes. Number of falls 1 - fall out of a chair/stool   LIVING ENVIRONMENT: Lives with: lives alone Lives in: House/apartment Stairs: Yes: External: 5-6 steps; on right going up, on left going up, and can reach both Has following equipment at home: Single point cane, Walker - 2 wheeled, and Ramped entry  OCCUPATION: Retired  PLOF: Independent and Leisure: no regular exercise, Comptroller at [employer]   PATIENT GOALS: To be able t walk/move around  confidently.   OBJECTIVE: (objective measures completed at initial evaluation unless otherwise dated)  DIAGNOSTIC FINDINGS:  N/A  COGNITION: Overall cognitive status: Within functional limits for tasks assessed   SENSATION: WFL  COORDINATION: LE gross motor WFL  POSTURE:  rounded shoulders, forward head, and flexed trunk   MUSCLE LENGTH: Hamstrings: mild/mod tight B ITB: mild/mod tight B Piriformis: mod tight B Hip flexors: mod tight B Quads: mild/mod tight B Heelcord: mild/mod tight B  LOWER EXTREMITY ROM:    B knee grossly 0-115  LOWER EXTREMITY MMT:    MMT Right eval Left eval R 12/31/23 L 12/31/23  Hip flexion 4- 4- 4 4+  Hip extension 4- 4- 4- 4+  Hip abduction 4- 4- 4 4+  Hip adduction 4- 4- 5 4+  Hip internal rotation 4- 4- 4 4+  Hip external rotation 4- 4 4- 4  Knee flexion 3+ 3+ 4+ 4+  Knee extension 4+ 4+ 4+ 4  Ankle dorsiflexion 4 4 5 5   Ankle plantarflexion      Ankle inversion      Ankle eversion      (Blank rows = not tested)  BED MOBILITY:  Independent  TRANSFERS: Assistive device utilized: None  Sit to stand: Complete Independence Stand to sit: Complete Independence Chair to chair: Complete Independence Floor: NT  GAIT: Distance walked: Clinic distances Assistive device utilized: None Level of assistance: Complete Independence Gait pattern: WFL   FUNCTIONAL TESTS:  5 times sit to stand: 17.03 sec Timed up and go (TUG): 9.28 sec 6 minute walk test: see below (12/10/23) 10 meter walk test: 8.93 sec; Gait speed = 3.67 ft/sec Berg Balance Scale:  52/56 (12/10/23); 52-55/56 indicates lower (> 25%) risk for falls Functional gait assessment: 20/30 (12/10/23); 19-24/30 indicates medium risk for falls  12/10/23: 6 Minute Walk- Baseline  BP (mmHg) 162/80   HR (bpm) 77   02 Sat (%RA) 96 %   Modified Borg Scale for Dyspnea 0- Nothing at all   Perceived Rate of Exertion (Borg) 6-   6 Minute walk- Post Test  BP (mmHg) 168/78   HR (bpm) 97    02 Sat (%RA) 97 %   Modified Borg Scale for Dyspnea 3- Moderate shortness of breath or breathing difficulty   Perceived Rate of Exertion (Borg) 12-  6 minute walk test results   Aerobic Endurance Distance Walked 1144', Normal range for females 80-90: 730' - 1840'  Standardized Balance Assessment  Standardized Balance Assessment Berg Balance Test   Berg Balance Test  Sit to Stand Able to stand without using hands and stabilize independently   Standing Unsupported Able to stand safely 2 minutes   Sitting with Back Unsupported but Feet Supported on Floor or Stool Able to sit safely and securely 2 minutes   Stand to Sit Sits safely with minimal use of hands   Transfers Able to transfer safely, minor use of hands   Standing Unsupported with Eyes Closed Able to stand 10 seconds safely   Standing Unsupported with Feet Together Able to place feet together independently and stand 1 minute safely   From Standing, Reach Forward with Outstretched Arm Can reach forward >12 cm safely (5)   From Standing Position, Pick up Object from Floor Able to pick up shoe safely and easily   From Standing Position, Turn to Look Behind Over each Shoulder Looks behind from both sides and weight shifts well   Turn 360 Degrees Able to turn 360 degrees safely in 4 seconds or less   Standing Unsupported, Alternately Place Feet on Step/Stool Able to stand independently and safely and complete 8 steps in 20 seconds   Standing Unsupported, One Foot in Front Able to plae foot ahead of the other independently and hold 30 seconds   Standing on One Leg Able to lift leg independently and hold equal to or more than 3 seconds   Total Score 52   Berg comment: 52-55 lower (> 25%) risk for falls   Functional Gait  Assessment  Gait Level Surface Walks 20 ft in less than 7 sec but greater than 5.5 sec, uses assistive device, slower speed, mild gait deviations, or deviates 6-10 in outside of the 12 in walkway width.   Change in Gait  Speed Able to smoothly change walking speed without loss of balance or gait deviation. Deviate no more than 6 in outside of the 12 in walkway width.   Gait with Horizontal Head Turns Performs head turns smoothly with slight change in gait velocity (eg, minor disruption to smooth gait path), deviates 6-10 in outside 12 in walkway width, or uses an assistive device.   Gait with Vertical Head Turns Performs task with slight change in gait velocity (eg, minor disruption to smooth gait path), deviates 6 - 10 in outside 12 in walkway width or uses assistive device   Gait and Pivot Turn Pivot turns safely within 3 sec and stops quickly with no loss of balance.   Step Over Obstacle Is able to step over 2 stacked shoe boxes taped together (9 in total height) without changing gait speed. No evidence of imbalance.   Gait with Narrow Base of Support Ambulates 4-7 steps.   Gait with Eyes Closed Walks 20 ft, slow speed, abnormal gait pattern, evidence for imbalance, deviates 10-15 in outside 12 in walkway width. Requires more than 9 sec to ambulate 20 ft.   Ambulating Backwards Walks 20 ft, slow speed, abnormal gait pattern, evidence for imbalance, deviates 10-15 in outside 12 in walkway width.   Steps Alternating feet, must use rail.   Total Score 20   FGA comment: 19-24 = medium risk fall    12/28/23: 5xSTS = 11.00 sec, <15 sec indicates decreased risk for recurrent falls FGA = 23/30, 19-24 = medium risk fall    PATIENT SURVEYS:  ABC scale 1310 / 1600 = 81.9 %; >80% indicates a high level of physical functioning   TODAY'S TREATMENT:   01/19/2024  THERAPEUTIC EXERCISE: To improve strength and endurance.   Rec Bike - L4 x 6 min  GAIT TRAINING: To normalize gait pattern and decrease risk for falls. 600 ft over variable surfaces indoors and outdoors including grass, gravel and up/down curb w/o LOB Stairs: Level of Assistance: Modified independence Stair Negotiation Technique: Alternating Pattern  with  Single Rail on Right Number of Stairs: 14 x 2  Height of Stairs: 7   NEUROMUSCULAR RE-EDUCATION: To improve balance, coordination, kinesthesia, and proprioception. SLS + opp LE GTB 4-way SLR x 10 - occasional UE support on back of chair for balance Standing hip flexion march with looped RTB at midfeet x 10 - occasional UE support on back of chair for balance Standing RTB HS curls x 10 bil - occasional UE support on back of chair for balance Corner balance progression with tandem stance on firm surface with arms at sides            Eyes open: static stance x 30 sec horiz head turns x 5 vertical head nods x 5 trunk rotation x 5 Eyes closed: static stance x 15 sec   01/15/2024  THERAPEUTIC EXERCISE: To improve strength and endurance.  Demonstration, verbal and tactile cues throughout for technique.  NuStep - L5 x 6 min - UE/LE  NEUROMUSCULAR RE-EDUCATION: To improve balance, coordination, kinesthesia, posture, proprioception, and reduce fall risk. L SLS + R hip ABD/ER isometric into counter 10 x 3-5 - minimal UE support on counter for balance  SLS + opp LE GTB 4-way SLR x 10 - occasional UE support on back of chair for balance Corner balance progression with tandem stance (bilaterally) on firm surface with arms at sides            Eyes open: static stance x 30 sec horiz head turns x 5   01/12/24 Bike L3x46min  THERAPEUTIC ACTIVITIES: To improve functional performance.  Lateral lunges x 10 B Standing balance staggered foot on 2' book x 30' B ON airex:  Fwd step and reciprocal arm reach x 10 B  Lateral step and arm reach x 10 B Leg ext 15lb x 10 BLE; B con/R/L ecc 5lb x 10   01/04/2024 - PN THERAPEUTIC EXERCISE: To improve strength and endurance.  Demonstration, verbal and tactile cues throughout for technique. Rec Bike - L3 x 6 min Seated hip hinge L HS stretch x 30 Hooklying 3-way HS stretch with strap 3 x 30 (1 rep each with foot in neutral as well as LE ER & IR - best  stretch felt in IR, therefore completed 2 extra reps) Hooklying HS stretch in neutral and LE IR with strap x 30 each following MT - pain resolved  MANUAL THERAPY: To promote normalized muscle tension, improved flexibility, and reduced pain utilizing connective tissue massage, therapeutic massage, and manual TP therapy.  STM/DTM and manual TPR to L distal biceps femoris  NEUROMUSCULAR RE-EDUCATION: To improve balance, coordination, kinesthesia, posture, and proprioception. Standing hip ABD with looped GTB at ankles 2 x 10 Standing hip extension with looped GTB at ankles 2 x 10 Seated alt hip ABD/ER with looped GTB at distal thighs 2 x 10 S/L R GTB clam 2 x 10   12/31/23 Nu-Step L1 x 6 min LE MMT Clock balance 12-6:00 x 10 BLE with intermittent UE support Step touch 9 step x 10 BLE  intermittent UE support Step touch from airex 9 step x 2/10 BLE with CGA/minA  Standing Red TB 4 way hip x 10 BLE UE w/ support  Mini squats with 5 sec holds x 10   12/28/2023  THERAPEUTIC EXERCISE: To improve strength and endurance.  Demonstration, verbal and tactile cues throughout for technique. Rec Bike - L3 x 6 min  PHYSICAL PERFORMANCE TEST or MEASUREMENT: 5xSTS = 11.00 sec, <15 sec indicates decreased risk for recurrent falls FGA = 23/30, 19-24 = medium risk fall  Functional Gait  Assessment  Gait Level Surface Walks 20 ft in less than 7 sec but greater than 5.5 sec, uses assistive device, slower speed, mild gait deviations, or deviates 6-10 in outside of the 12 in walkway width.   Change in Gait Speed Able to smoothly change walking speed without loss of balance or gait deviation. Deviate no more than 6 in outside of the 12 in walkway width.   Gait with Horizontal Head Turns Performs head turns smoothly with slight change in gait velocity (eg, minor disruption to smooth gait path), deviates 6-10 in outside 12 in walkway width, or uses an assistive device.   Gait with Vertical Head Turns Performs  head turns with no change in gait. Deviates no more than 6 in outside 12 in walkway width.   Gait and Pivot Turn Pivot turns safely within 3 sec and stops quickly with no loss of balance.   Step Over Obstacle Is able to step over one shoe box (4.5 in total height) without changing gait speed. No evidence of imbalance.   Gait with Narrow Base of Support Ambulates 7-9 steps.   Gait with Eyes Closed Walks 20 ft, uses assistive device, slower speed, mild gait deviations, deviates 6-10 in outside 12 in walkway width. Ambulates 20 ft in less than 9 sec but greater than 7 sec.   Ambulating Backwards Walks 20 ft, uses assistive device, slower speed, mild gait deviations, deviates 6-10 in outside 12 in walkway width.   Steps Alternating feet, must use rail.   Total Score 23   FGA comment: 19-24 = medium risk fall       THERAPEUTIC ACTIVITIES: To improve functional performance.  Demonstration, verbal and tactile cues throughout for technique. Worked on floor to/from stand transfers via 1/2 kneel on R (on Airex pad) with L foot forward, pushing up from R hand on floor and L hand on knee x 1 rep Fwd step-up to 6 step x 10 B Fwd step-up to 8 step x 10 B  NEUROMUSCULAR RE-EDUCATION: To improve balance, coordination, kinesthesia, posture, proprioception, and reduce fall risk. Alt toe clears with light toe tap to 9 step x 20 SLS + 5-way (1/2 circle) toe tap to colored dots SLS + 3-way hip swing (flexion, ABD, extension) x 3 cycles w/o touching down, 1 pole A for balance    12/24/2023  THERAPEUTIC EXERCISE: To improve strength and endurance.  Demonstration, verbal and tactile cues throughout for technique.  Rec Bike - L2 x 6 min Standing HS curl with RTB at ankles x 15 bil  THERAPEUTIC ACTIVITIES: To improve functional performance.  Demonstration, verbal and tactile cues throughout for technique. Worked on floor to/from stand transfers via 1/2 kneel on R (on Airex pad) with L foot forward, pushing up  from R hand on floor and L hand on knee x 2 reps - increased effort necessary but pt verbalizing good understanding of technique  NEUROMUSCULAR RE-EDUCATION: To improve balance, coordination, kinesthesia, posture, proprioception,  and reduce fall risk. SLS + 5-way (1/2 circle) toe tap to colored dots SLS + 2-way hip swing (flexion & extension) x 5 cycles w/o touching down, 2 pole A for balance  SLS + 3-way hip swing (flexion, ABD, extension) x 5 cycles w/o touching down, 2 pole A for balance  Alt toe clears with light toe tap to 9 step x 10 Alt toe clears with light toe tap to 9 step from standing on Airex pad x 10   12/21/23 Nustep level 5 x 6 minutes Supine HS stretch x 30 strap Supine piriformis stretch x30 Supine quad stretch x 30  Supine ITB stretch x 30: S/L clamshell RTB 15x3 B Standing:  Hip extension 3lb x 10 B  Hip abduction 3lb x 10 B  Marching x 10 3lb B  HS curls RTB 2 x 10 B Education on fall risk   12/18/23 Nustep level 5 x 5 minutes Supine passive HS, piriformis, adductor stretches Supine bridges Supine green tband clamshells Side stepping Cone toe touch  On airex balance, head turns, eyes closed, ball toss On airex cone toe touches Backward walking Side step on and off airex Direction changes Star with colored dots PT calling out colors and her stepping toward SLS x 5 seconds each Talked about getting up off the floor   12/16/2023 THERAPEUTIC EXERCISE: To improve strength, endurance, and flexibility.  Demonstration, verbal and tactile cues throughout for technique.  Rec Bike - L1 x 6 min Seated hamstring curls with looped RTB at ankles 2 x 10 bil S/L RTB clam 2 x 10 Hooklying L HS stretch with strap x 30 Supine L HS stretch with strap x 30 Supine L ITB stretch with strap x 30 Hooklying L KTOS piriformis with strap for added reach x 30 Mod thomas L quad/hip flexor stretch with strap x 30  NEUROMUSCULAR RE-EDUCATION: To improve balance,  coordination, kinesthesia, posture, and proprioception.  Standing alt hip abduction with looped RTB at thighs x 10 bil Standing alt hip abduction with looped RTB at ankles x 10 bil Standing alt hip extension with looped RTB at ankles x 10 bil Standing alt hip flexion march with looped RTB at midfeet x 10 bil   PATIENT EDUCATION:  Education details: HEP progression - SLS + GTB 4-way SLR & corner balance in tandem stance   Person educated: Patient Education method: Programmer, multimedia, Demonstration, Verbal cues, and Handouts Education comprehension: verbalized understanding, returned demonstration, verbal cues required, and needs further education  HOME EXERCISE PROGRAM: Access Code: [email or web address] URL: [email or web address] Date: 01/19/2024 Prepared by: Elijah Hidden  Exercises - Supine Hamstring Stretch with Strap  - 1-2 x daily - 7 x weekly - 3 reps - 30 sec hold - Supine Iliotibial Band Stretch with Strap  - 1-2 x daily - 7 x weekly - 3 reps - 30 sec hold - Supine Piriformis Stretch with Foot on Ground  - 1-2 x daily - 7 x weekly - 3 reps - 30 sec hold - Hip Flexor Stretch with Strap on Table  - 1-2 x daily - 7 x weekly - 3 reps - 30 sec hold - Clam with Resistance  - 1 x daily - 3-4 x weekly - 2 sets - 10 reps - 3-5 sec hold - Marching with Resistance  - 1 x daily - 3-4 x weekly - 2 sets - 10 reps - 3 sec hold - Standing Hamstring Curl with Resistance  - 1 x daily - 3-4 x weekly - 2 sets -  10 reps - Standing Hip Flexion with Anchored Resistance and Chair Support  - 1 x daily - 3-4 x weekly - 2 sets - 10 reps - 3 sec hold - Standing Hip Adduction with Anchored Resistance  - 1 x daily - 3-4 x weekly - 2 sets - 10 reps - 3 sec hold - Standing Hip Extension with Anchored Resistance  - 1 x daily - 3-4 x weekly - 2 sets - 10 reps - 3 sec hold - Standing Hip Abduction with Anchored Resistance  - 1 x daily - 3-4 x weekly - 2 sets - 10 reps - 3 sec hold - Semi-Tandem Corner Balance: Eyes  Open With Head Turns  - 1 x daily - 3-4 x weekly - 2 sets - 5 reps  Patient Education - What You Can Do to Prevent Falls   ASSESSMENT:  CLINICAL IMPRESSION: Talia reports no concerns with the recent HEP update but did requires minor cues for alignment with hip ADD during review of standing 4-way SLR. Gait assessed on variable surfaces including grass, gravel, inclines, curbs and stairs with no instability or LOB noted - LTG #2 met.  HEP condensed highlighting reduced frequency to 3x/wk for strengthening exercises, splitting exercises into subgroups for completion on alternating days.  Reviewed corner balance setup and self progression for continued balance practice at home.  Chanita is progressing well with her PT goals and should hopefully be ready to transition to her HEP with probable 30-day hold allowing for 1x/wk for up to an additional 4 wks pending results of final goal assessment next visit.  From EVAL: ZEMIRA ZEHRING is a 83 y.o. female who was referred to physical therapy for evaluation and treatment for fatigue with associated LE weakness.  Patient presents with physical impairments of impaired activity tolerance, impaired standing balance, impaired ambulation, and decreased safety awareness impacting safe and independent functional mobility.  Examination revealed gait speed of 3.67 ft/sec, (2.62 ft/sec is needed for community access) and TUG of 9.28 sec (>13.5 sec indicates increased risk for falls) WNL however increased risk for falls and functional decline as evidenced by the following objective test measures:  5xSTS of 17.03 sec (>15 sec indicates increased risk for falls and decreased BLE power). Further assessment of Berg, FGA and 6 minute walk test to be completed next visit.  Evanna will benefit from skilled PT to address above deficits to improve mobility and activity tolerance to help reach the maximal level of functional independence and mobility. Patient demonstrates understanding of  this POC and is in agreement with this plan.   OBJECTIVE IMPAIRMENTS: Abnormal gait, decreased activity tolerance, decreased balance, decreased mobility, difficulty walking, decreased strength, decreased safety awareness, increased fascial restrictions, impaired perceived functional ability, increased muscle spasms, improper body mechanics, postural dysfunction, and pain.   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   REHAB POTENTIAL: Good  CLINICAL DECISION MAKING: Evolving/moderate complexity  EVALUATION COMPLEXITY: Moderate   GOALS: Goals reviewed with patient? Yes  SHORT TERM GOALS: Target date: 01/04/2024  Patient will be independent with initial HEP to improve outcomes and carryover.  Baseline: Initial HEP provided on eval 12/10/23 - Pt has not yet attempted  initial HEP 12/16/23 - HEP reviewed and updated Goal status: MET - 12/24/23  2.  Patient will be educated on strategies to decrease risk of falls.  Baseline:  Goal status: MET - 12/21/23  3.  Patient will improve 5xSTS time to </= 15 seconds for improved efficiency and safety with transfers. Baseline: 17.03 sec Goal status: MET - 12/28/23 - 11.00 sec  LONG TERM GOALS: Target date: 02/01/2024  Patient will be independent with advanced/ongoing HEP to facilitate ability to maintain/progress functional gains from skilled physical therapy services. Baseline:  Goal status: IN PROGRESS - 01/19/24 - HEP progressed today  2.  Patient will be able to  ambulate 600' with or w/o LRAD on variable surfaces including curb and stair negotiation with single rail with good safety to access community.  Baseline:  Goal status: MET - 01/19/24   3. Patient will demonstrate improved B LE strength to >/= 4 to 4+/5 for improved stability and ease of mobility . Baseline: Refer to above LE MMT table Goal status: IN PROGRESS - 12/31/23 see MMT chart  4.  Patient will improve Berg score by at least 8 points or to >/= 52/56 to improve safety and stability with ADLs in standing and reduce risk for falls. (MCID= 8 points)  Baseline: 52/26 (12/10/23) Goal status: MET - 12/10/23  5. Patient will improve FGA score by at least 4 points or to at least 21/30 to improve gait stability and reduce risk for falls. Baseline: 20/30 (12/10/23) Goal status: IN PROGRESS - 12/28/23 - 23/30   PLAN:  PT FREQUENCY: 2x/week  PT DURATION: 8 weeks  PLANNED INTERVENTIONS: 02835- PT Re-evaluation, 97750- Physical Performance Testing, 97110-Therapeutic exercises, 97530- Therapeutic activity, 97112- Neuromuscular re-education, 97535- Self Care, 02859- Manual therapy, [id number]- Gait training, [phone]- Electrical stimulation (unattended), [id number]- Vasopneumatic device, 97035- Ultrasound, 02966- Ionotophoresis 4mg /ml Dexamethasone , Patient/Family education, Balance training, Stair training, Taping, Dry Needling, Cryotherapy, and Moist heat  PLAN FOR NEXT SESSION: complete LTG assessment - LE MMT, FGA and final HEP review; anticipate transition to her HEP +/- 30-day hold    Elijah CHRISTELLA Hidden, PT 01/19/2024, 10:43 AM   Date of referral: 10/27/23 Referring provider: Daryl Setter, NP Referring diagnosis? R53.83 (ICD-10-CM) - Fatigue, unspecified type  Treatment diagnosis? (if different than referring diagnosis)  Muscle weakness (generalized)  Unsteadiness on feet  History of falling  Difficulty in walking, not elsewhere classified  What was this (referring dx) caused by? Fall, Ongoing  Issue, and Unspecified  Nature of Condition: Chronic (continuous duration > 3 months)   Laterality: Both  Current Functional Measure Score: Other ABC scale 1380 / 1600 = 86.3 %   Objective measurements identify impairments when they are compared to normal values, the uninvolved extremity, and prior level of function.  [x]  Yes  []  No  Objective assessment of functional ability: Moderate functional limitations   Briefly describe symptoms: Aviya reports no concerns with the recent HEP update but did requires minor cues for alignment with hip ADD during review of standing 4-way SLR. Gait assessed on variable surfaces including grass, gravel, inclines, curbs and stairs with no instability or LOB noted - LTG #2 met.  HEP condensed highlighting reduced frequency to 3x/wk for strengthening exercises, splitting exercises into subgroups for completion on alternating days.  Reviewed corner balance setup and self progression for continued balance practice at home.  Zoe is progressing well with her PT goals and should hopefully be ready to transition to her HEP with probable 30-day hold allowing for 1x/wk for up  to an additional 4 wks pending results of final goal assessment next visit.  How did symptoms start: gradual onset  Average pain intensity:  Last 24 hours: 0/10  Past week: 0/10  How often does the pt experience symptoms? Intermittently  How much have the symptoms interfered with usual daily activities? A little bit  How has condition changed since care began at this facility? Better  In general, how is the patients overall health? Very Good  Onset date: ~1 year   BACK PAIN (STarT Back Screening Tool) - (When applicable): N/A  Has your back pain spread down your leg(s) at sometime in the last 2 weeks? []  Yes   []  No Have you had pain in the shoulder or neck at sometime in the past 2 weeks? []  Yes   []  No Have you only walked short distances because of your back pain? []  Yes   []   No In the past 2 weeks, have you dressed more slowly than usual because of your back pain? []  Yes   []  No Do you think it is not really safe for person with a condition like yours to be physically active? []  Yes   []  No Have worrying thoughts been going through your mind a lot of the time? []  Yes   []  No Do you feel that your back pain is terrible and it is never going to get any better? []  Yes   []  No In general, have you stopped enjoying all the things you usually enjoy? []  Yes   []  No Overall, how bothersome has your back pain been in the last 2 weeks? []  Not at all   []  Slightly     []  Moderate   []  Very much     []  Extremely

## 2024-01-21 ENCOUNTER — Encounter: Payer: Self-pay | Admitting: Physical Therapy

## 2024-01-21 ENCOUNTER — Ambulatory Visit: Admitting: Physical Therapy

## 2024-01-21 DIAGNOSIS — M6281 Muscle weakness (generalized): Secondary | ICD-10-CM | POA: Diagnosis not present

## 2024-01-21 DIAGNOSIS — R262 Difficulty in walking, not elsewhere classified: Secondary | ICD-10-CM

## 2024-01-21 DIAGNOSIS — R2681 Unsteadiness on feet: Secondary | ICD-10-CM

## 2024-01-21 DIAGNOSIS — Z9181 History of falling: Secondary | ICD-10-CM

## 2024-01-21 NOTE — Therapy (Addendum)
 OUTPATIENT PHYSICAL THERAPY TREATMENT / DISCHARGE SUMMARY   Progress Note  Reporting Period 01/04/2024 to 01/21/2024   See note below for Objective Data and Assessment of Progress/Goals.     Patient Name: Angelica Davis MRN: [id number] DOB:January 17, 1941, 83 y.o., female Today's Date: 01/21/2024   END OF SESSION:  PT End of Session - 01/21/24 1012     Visit Number 13    Date for PT Re-Evaluation 02/01/24    Authorization Type [employer]    Authorization Time Period auth pending    PT Start Time 1012    PT Stop Time 1101    PT Time Calculation (min) 49 min    Activity Tolerance Patient tolerated treatment well    Behavior During Therapy WFL for tasks assessed/performed            Past Medical History:  Diagnosis Date   Arthritis    Cataract    bilateral, has had removed   Colitis    Diverticulosis    History of blood transfusion    after birth of child   Hx of colonic polyps - adenomas 08/16/2002   Hyperlipidemia    Skin cancer    skin- On bridge of nose, basal cell carcinoma per patient   Past Surgical History:  Procedure Laterality Date   CATARACT EXTRACTION Bilateral 01/13/2019   COLONOSCOPY  last 05/12/2014   polyps removed 2015, 2020 TA x1   KNEE ARTHROSCOPY Left 06/2007   Dr. Jane   MOHS SURGERY     bridge of nose x 2 (unsure of dates ?2021 and 2018)   PARTIAL KNEE ARTHROPLASTY Right 08/27/2021   Procedure: UNICOMPARTMENTAL KNEE;  Surgeon: Josefina Chew, MD;  Location: [facility];  Service: Orthopedics;  Laterality: Right;   TONSILLECTOMY     TOTAL KNEE ARTHROPLASTY Left 12/2008   Dr. Jane   Patient Active Problem List   Diagnosis Date Noted   Dyspnea on exertion 10/27/2023   Fatigue 10/27/2023   Preventative health care 10/27/2023   Skin cancer 09/17/2022   Urinary incontinence 09/17/2022   Cough 09/17/2022   S/P right unicompartmental knee replacement 08/27/2021   Osteoarthritis of right knee 08/06/2021   Pain of left hand 11/16/2019    Trigger finger of left hand 11/16/2019   Hyperlipidemia 09/04/2015   Hx of colonic polyps - adenomas 08/16/2002    PCP: Daryl Setter, NP   REFERRING PROVIDER: Daryl Setter, NP   REFERRING DIAG: R53.83 (ICD-10-CM) - Fatigue, unspecified type   C/o LE weakness, please evaluate for strength/balance.   THERAPY DIAG:  Muscle weakness (generalized)  Unsteadiness on feet  History of falling  Difficulty in walking, not elsewhere classified  RATIONALE FOR EVALUATION AND TREATMENT: Rehabilitation  ONSET DATE: up to 1 yr  NEXT MD VISIT: 11/15/2024   SUBJECTIVE:  SUBJECTIVE STATEMENT: Pt doing well today - no concerns noted. She reports her cardiologist plans to to an echo next month.   EVAL: Pt reports her house is on a hill - every time she has to take the trash out or go for the mail she finds herself feeling weak and out of breath.  She volunteers at the [employer] where she slid/fell out of a chair to the floor ~1 month ago and could not get up.  PAIN: Are you having pain? No  PERTINENT HISTORY:  Arthritis, R unicompartmental knee replacement 08/2021, L TKA 12/2008, colitis, diverticulosis, HLD, skin cancer, DOE, fatigue, urinary incontinence  PRECAUTIONS: Fall  RED FLAGS: None  WEIGHT BEARING RESTRICTIONS: No  FALLS:  Has patient fallen in last 6 months? Yes. Number of falls 1 - fall out of a chair/stool   LIVING ENVIRONMENT: Lives with: lives alone Lives in: House/apartment Stairs: Yes: External: 5-6 steps; on right going up, on left going up, and can reach both Has following equipment at home: Single point cane, Walker - 2 wheeled, and Ramped entry  OCCUPATION: Retired  PLOF: Independent and Leisure: no regular exercise, comptroller at [employer]   PATIENT GOALS:  To be able t walk/move around confidently.   OBJECTIVE: (objective measures completed at initial evaluation unless otherwise dated)  DIAGNOSTIC FINDINGS:  N/A  COGNITION: Overall cognitive status: Within functional limits for tasks assessed   SENSATION: WFL  COORDINATION: LE gross motor WFL  POSTURE:  rounded shoulders, forward head, and flexed trunk   MUSCLE LENGTH: Hamstrings: mild/mod tight B ITB: mild/mod tight B Piriformis: mod tight B Hip flexors: mod tight B Quads: mild/mod tight B Heelcord: mild/mod tight B  LOWER EXTREMITY ROM:    B knee grossly 0-115  LOWER EXTREMITY MMT:    MMT Right eval Left eval R 12/31/23 L 12/31/23 R 01/21/24 L 01/21/24  Hip flexion 4- 4- 4 4+ 5 5  Hip extension 4- 4- 4- 4+ 5 5  Hip abduction 4- 4- 4 4+ 4+ 4+  Hip adduction 4- 4- 5 4+ 5 4+  Hip internal rotation 4- 4- 4 4+ 4+ 4+  Hip external rotation 4- 4 4- 4 5 4+  Knee flexion 3+ 3+ 4+ 4+ 5 5  Knee extension 4+ 4+ 4+ 4 5 5   Ankle dorsiflexion 4 4 5 5 5 5   Ankle plantarflexion        Ankle inversion        Ankle eversion        (Blank rows = not tested)  BED MOBILITY:  Independent  TRANSFERS: Assistive device utilized: None  Sit to stand: Complete Independence Stand to sit: Complete Independence Chair to chair: Complete Independence Floor: NT  GAIT: Distance walked: Clinic distances Assistive device utilized: None Level of assistance: Complete Independence Gait pattern: WFL   FUNCTIONAL TESTS:  5 times sit to stand: 17.03 sec Timed up and go (TUG): 9.28 sec 6 minute walk test: see below (12/10/23) 10 meter walk test: 8.93 sec; Gait speed = 3.67 ft/sec Berg Balance Scale:  52/56 (12/10/23); 52-55/56 indicates lower (> 25%) risk for falls Functional gait assessment: 20/30 (12/10/23); 19-24/30 indicates medium risk for falls  12/10/23: 6 Minute Walk- Baseline  BP (mmHg) 162/80   HR (bpm) 77   02 Sat (%RA) 96 %   Modified Borg Scale for Dyspnea 0- Nothing at all    Perceived Rate of Exertion (Borg) 6-   6 Minute walk- Post Test  BP (mmHg) 168/78   HR (  bpm) 97   02 Sat (%RA) 97 %   Modified Borg Scale for Dyspnea 3- Moderate shortness of breath or breathing difficulty   Perceived Rate of Exertion (Borg) 12-   6 minute walk test results   Aerobic Endurance Distance Walked 1144', Normal range for females 80-90: 730' - 1840'  Standardized Balance Assessment  Standardized Balance Assessment Berg Balance Test   Berg Balance Test  Sit to Stand Able to stand without using hands and stabilize independently   Standing Unsupported Able to stand safely 2 minutes   Sitting with Back Unsupported but Feet Supported on Floor or Stool Able to sit safely and securely 2 minutes   Stand to Sit Sits safely with minimal use of hands   Transfers Able to transfer safely, minor use of hands   Standing Unsupported with Eyes Closed Able to stand 10 seconds safely   Standing Unsupported with Feet Together Able to place feet together independently and stand 1 minute safely   From Standing, Reach Forward with Outstretched Arm Can reach forward >12 cm safely (5)   From Standing Position, Pick up Object from Floor Able to pick up shoe safely and easily   From Standing Position, Turn to Look Behind Over each Shoulder Looks behind from both sides and weight shifts well   Turn 360 Degrees Able to turn 360 degrees safely in 4 seconds or less   Standing Unsupported, Alternately Place Feet on Step/Stool Able to stand independently and safely and complete 8 steps in 20 seconds   Standing Unsupported, One Foot in Front Able to plae foot ahead of the other independently and hold 30 seconds   Standing on One Leg Able to lift leg independently and hold equal to or more than 3 seconds   Total Score 52   Berg comment: 52-55 lower (> 25%) risk for falls   Functional Gait  Assessment  Gait Level Surface Walks 20 ft in less than 7 sec but greater than 5.5 sec, uses assistive device, slower  speed, mild gait deviations, or deviates 6-10 in outside of the 12 in walkway width.   Change in Gait Speed Able to smoothly change walking speed without loss of balance or gait deviation. Deviate no more than 6 in outside of the 12 in walkway width.   Gait with Horizontal Head Turns Performs head turns smoothly with slight change in gait velocity (eg, minor disruption to smooth gait path), deviates 6-10 in outside 12 in walkway width, or uses an assistive device.   Gait with Vertical Head Turns Performs task with slight change in gait velocity (eg, minor disruption to smooth gait path), deviates 6 - 10 in outside 12 in walkway width or uses assistive device   Gait and Pivot Turn Pivot turns safely within 3 sec and stops quickly with no loss of balance.   Step Over Obstacle Is able to step over 2 stacked shoe boxes taped together (9 in total height) without changing gait speed. No evidence of imbalance.   Gait with Narrow Base of Support Ambulates 4-7 steps.   Gait with Eyes Closed Walks 20 ft, slow speed, abnormal gait pattern, evidence for imbalance, deviates 10-15 in outside 12 in walkway width. Requires more than 9 sec to ambulate 20 ft.   Ambulating Backwards Walks 20 ft, slow speed, abnormal gait pattern, evidence for imbalance, deviates 10-15 in outside 12 in walkway width.   Steps Alternating feet, must use rail.   Total Score 20   FGA comment: 19-24 =  medium risk fall    12/28/23: 5xSTS = 11.00 sec, <15 sec indicates decreased risk for recurrent falls FGA = 23/30, 19-24 = medium risk fall    PATIENT SURVEYS:  ABC scale 1310 / 1600 = 81.9 %; >80% indicates a high level of physical functioning   TODAY'S TREATMENT:   01/21/2024  THERAPEUTIC EXERCISE: To improve strength and endurance.   Rec Bike - L4 x 7 min  THERAPEUTIC ACTIVITIES: To improve functional performance.  Demonstration, verbal and tactile cues throughout for technique. LE MMT Goal assessment  PHYSICAL PERFORMANCE TEST  or MEASUREMENT:  6 Minute Walk- Baseline   BP (mmHg) 142/78    HR (bpm) 87    02 Sat (%RA) 95 %    Modified Borg Scale for Dyspnea 0- Nothing at all    Perceived Rate of Exertion (Borg) 6-    6 Minute walk- Post Test   BP (mmHg) 166/74    HR (bpm) 105    02 Sat (%RA) 95 %    Modified Borg Scale for Dyspnea 3- Moderate shortness of breath or breathing difficulty    Perceived Rate of Exertion (Borg) 13- Somewhat hard    6 minute walk test results    Aerobic Endurance Distance Walked 1273 ft - WNL for age (mean for females age 58-89 = 1286 ft, range 730' to 1840')      Functional Gait  Assessment   Gait Level Surface Walks 20 ft in less than 5.5 sec, no assistive devices, good speed, no evidence for imbalance, normal gait pattern, deviates no more than 6 in outside of the 12 in walkway width.    Change in Gait Speed Able to smoothly change walking speed without loss of balance or gait deviation. Deviate no more than 6 in outside of the 12 in walkway width.    Gait with Horizontal Head Turns Performs head turns smoothly with no change in gait. Deviates no more than 6 in outside 12 in walkway width    Gait with Vertical Head Turns Performs head turns with no change in gait. Deviates no more than 6 in outside 12 in walkway width.    Gait and Pivot Turn Pivot turns safely within 3 sec and stops quickly with no loss of balance.    Step Over Obstacle Is able to step over one shoe box (4.5 in total height) without changing gait speed. No evidence of imbalance.    Gait with Narrow Base of Support Is able to ambulate for 10 steps heel to toe with no staggering.    Gait with Eyes Closed Walks 20 ft, slow speed, abnormal gait pattern, evidence for imbalance, deviates 10-15 in outside 12 in walkway width. Requires more than 9 sec to ambulate 20 ft.    Ambulating Backwards Walks 20 ft, no assistive devices, good speed, no evidence for imbalance, normal gait    Steps Alternating feet, no rail.    Total  Score 27    FGA comment: 25-28 = low risk for fall       SELF CARE: Provided education to prevent loss of gains achieved with physical therapy and to prevent future decline in function.  Reviewed recommended frequency for ongoing HEP performance as well as options for working on continued cardiovascular endurance including climbing controlled walking at the [facility] or at the fitness center at her church which includes a walking track.   01/19/2024  THERAPEUTIC EXERCISE: To improve strength and endurance.   Rec Bike - L4  x 6 min  GAIT TRAINING: To normalize gait pattern and decrease risk for falls. 600 ft over variable surfaces indoors and outdoors including grass, gravel and up/down curb w/o LOB Stairs: Level of Assistance: Modified independence Stair Negotiation Technique: Alternating Pattern  with Single Rail on Right Number of Stairs: 14 x 2  Height of Stairs: 7   NEUROMUSCULAR RE-EDUCATION: To improve balance, coordination, kinesthesia, and proprioception. SLS + opp LE GTB 4-way SLR x 10 - occasional UE support on back of chair for balance Standing hip flexion march with looped RTB at midfeet x 10 - occasional UE support on back of chair for balance Standing RTB HS curls x 10 bil - occasional UE support on back of chair for balance Corner balance progression with tandem stance on firm surface with arms at sides            Eyes open: static stance x 30 sec horiz head turns x 5 vertical head nods x 5 trunk rotation x 5 Eyes closed: static stance x 15 sec   01/15/2024  THERAPEUTIC EXERCISE: To improve strength and endurance.  Demonstration, verbal and tactile cues throughout for technique.  NuStep - L5 x 6 min - UE/LE  NEUROMUSCULAR RE-EDUCATION: To improve balance, coordination, kinesthesia, posture, proprioception, and reduce fall risk. L SLS + R hip ABD/ER isometric into counter 10 x 3-5 - minimal UE support on counter for balance  SLS + opp LE GTB 4-way  SLR x 10 - occasional UE support on back of chair for balance Corner balance progression with tandem stance (bilaterally) on firm surface with arms at sides            Eyes open: static stance x 30 sec horiz head turns x 5   01/12/24 Bike L3x69min  THERAPEUTIC ACTIVITIES: To improve functional performance.  Lateral lunges x 10 B Standing balance staggered foot on 2' book x 30' B ON airex:  Fwd step and reciprocal arm reach x 10 B  Lateral step and arm reach x 10 B Leg ext 15lb x 10 BLE; B con/R/L ecc 5lb x 10   01/04/2024 - PN THERAPEUTIC EXERCISE: To improve strength and endurance.  Demonstration, verbal and tactile cues throughout for technique. Rec Bike - L3 x 6 min Seated hip hinge L HS stretch x 30 Hooklying 3-way HS stretch with strap 3 x 30 (1 rep each with foot in neutral as well as LE ER & IR - best stretch felt in IR, therefore completed 2 extra reps) Hooklying HS stretch in neutral and LE IR with strap x 30 each following MT - pain resolved  MANUAL THERAPY: To promote normalized muscle tension, improved flexibility, and reduced pain utilizing connective tissue massage, therapeutic massage, and manual TP therapy.  STM/DTM and manual TPR to L distal biceps femoris  NEUROMUSCULAR RE-EDUCATION: To improve balance, coordination, kinesthesia, posture, and proprioception. Standing hip ABD with looped GTB at ankles 2 x 10 Standing hip extension with looped GTB at ankles 2 x 10 Seated alt hip ABD/ER with looped GTB at distal thighs 2 x 10 S/L R GTB clam 2 x 10   12/31/23 Nu-Step L1 x 6 min LE MMT Clock balance 12-6:00 x 10 BLE with intermittent UE support Step touch 9 step x 10 BLE intermittent UE support Step touch from airex 9 step x 2/10 BLE with CGA/minA  Standing Red TB 4 way hip x 10 BLE UE w/ support  Mini squats with 5 sec holds x 10  12/28/2023  THERAPEUTIC EXERCISE: To improve strength and endurance.  Demonstration, verbal and tactile cues throughout for  technique. Rec Bike - L3 x 6 min  PHYSICAL PERFORMANCE TEST or MEASUREMENT: 5xSTS = 11.00 sec, <15 sec indicates decreased risk for recurrent falls FGA = 23/30, 19-24 = medium risk fall  Functional Gait  Assessment  Gait Level Surface Walks 20 ft in less than 7 sec but greater than 5.5 sec, uses assistive device, slower speed, mild gait deviations, or deviates 6-10 in outside of the 12 in walkway width.   Change in Gait Speed Able to smoothly change walking speed without loss of balance or gait deviation. Deviate no more than 6 in outside of the 12 in walkway width.   Gait with Horizontal Head Turns Performs head turns smoothly with slight change in gait velocity (eg, minor disruption to smooth gait path), deviates 6-10 in outside 12 in walkway width, or uses an assistive device.   Gait with Vertical Head Turns Performs head turns with no change in gait. Deviates no more than 6 in outside 12 in walkway width.   Gait and Pivot Turn Pivot turns safely within 3 sec and stops quickly with no loss of balance.   Step Over Obstacle Is able to step over one shoe box (4.5 in total height) without changing gait speed. No evidence of imbalance.   Gait with Narrow Base of Support Ambulates 7-9 steps.   Gait with Eyes Closed Walks 20 ft, uses assistive device, slower speed, mild gait deviations, deviates 6-10 in outside 12 in walkway width. Ambulates 20 ft in less than 9 sec but greater than 7 sec.   Ambulating Backwards Walks 20 ft, uses assistive device, slower speed, mild gait deviations, deviates 6-10 in outside 12 in walkway width.   Steps Alternating feet, must use rail.   Total Score 23   FGA comment: 19-24 = medium risk fall       THERAPEUTIC ACTIVITIES: To improve functional performance.  Demonstration, verbal and tactile cues throughout for technique. Worked on floor to/from stand transfers via 1/2 kneel on R (on Airex pad) with L foot forward, pushing up from R hand on floor and L hand on knee x  1 rep Fwd step-up to 6 step x 10 B Fwd step-up to 8 step x 10 B  NEUROMUSCULAR RE-EDUCATION: To improve balance, coordination, kinesthesia, posture, proprioception, and reduce fall risk. Alt toe clears with light toe tap to 9 step x 20 SLS + 5-way (1/2 circle) toe tap to colored dots SLS + 3-way hip swing (flexion, ABD, extension) x 3 cycles w/o touching down, 1 pole A for balance    12/24/2023  THERAPEUTIC EXERCISE: To improve strength and endurance.  Demonstration, verbal and tactile cues throughout for technique.  Rec Bike - L2 x 6 min Standing HS curl with RTB at ankles x 15 bil  THERAPEUTIC ACTIVITIES: To improve functional performance.  Demonstration, verbal and tactile cues throughout for technique. Worked on floor to/from stand transfers via 1/2 kneel on R (on Airex pad) with L foot forward, pushing up from R hand on floor and L hand on knee x 2 reps - increased effort necessary but pt verbalizing good understanding of technique  NEUROMUSCULAR RE-EDUCATION: To improve balance, coordination, kinesthesia, posture, proprioception, and reduce fall risk. SLS + 5-way (1/2 circle) toe tap to colored dots SLS + 2-way hip swing (flexion & extension) x 5 cycles w/o touching down, 2 pole A for balance  SLS + 3-way hip  swing (flexion, ABD, extension) x 5 cycles w/o touching down, 2 pole A for balance  Alt toe clears with light toe tap to 9 step x 10 Alt toe clears with light toe tap to 9 step from standing on Airex pad x 10   12/21/23 Nustep level 5 x 6 minutes Supine HS stretch x 30 strap Supine piriformis stretch x30 Supine quad stretch x 30  Supine ITB stretch x 30: S/L clamshell RTB 15x3 B Standing:  Hip extension 3lb x 10 B  Hip abduction 3lb x 10 B  Marching x 10 3lb B  HS curls RTB 2 x 10 B Education on fall risk   12/18/23 Nustep level 5 x 5 minutes Supine passive HS, piriformis, adductor stretches Supine bridges Supine green tband clamshells Side  stepping Cone toe touch  On airex balance, head turns, eyes closed, ball toss On airex cone toe touches Backward walking Side step on and off airex Direction changes Star with colored dots PT calling out colors and her stepping toward SLS x 5 seconds each Talked about getting up off the floor   12/16/2023 THERAPEUTIC EXERCISE: To improve strength, endurance, and flexibility.  Demonstration, verbal and tactile cues throughout for technique.  Rec Bike - L1 x 6 min Seated hamstring curls with looped RTB at ankles 2 x 10 bil S/L RTB clam 2 x 10 Hooklying L HS stretch with strap x 30 Supine L HS stretch with strap x 30 Supine L ITB stretch with strap x 30 Hooklying L KTOS piriformis with strap for added reach x 30 Mod thomas L quad/hip flexor stretch with strap x 30  NEUROMUSCULAR RE-EDUCATION: To improve balance, coordination, kinesthesia, posture, and proprioception.  Standing alt hip abduction with looped RTB at thighs x 10 bil Standing alt hip abduction with looped RTB at ankles x 10 bil Standing alt hip extension with looped RTB at ankles x 10 bil Standing alt hip flexion march with looped RTB at midfeet x 10 bil   PATIENT EDUCATION:  Education details: HEP progression - SLS + GTB 4-way SLR & corner balance in tandem stance   Person educated: Patient Education method: Programmer, Multimedia, Demonstration, Verbal cues, and Handouts Education comprehension: verbalized understanding, returned demonstration, verbal cues required, and needs further education  HOME EXERCISE PROGRAM: Access Code: [email or web address] URL: [email or web address] Date: 01/19/2024 Prepared by: Elijah Hidden  Exercises - Supine Hamstring Stretch with Strap  - 1-2 x daily - 7 x weekly - 3 reps - 30 sec hold - Supine Iliotibial Band Stretch with Strap  - 1-2 x daily - 7 x weekly - 3 reps - 30 sec hold - Supine Piriformis Stretch with Foot on Ground  - 1-2 x daily - 7 x weekly - 3 reps - 30 sec hold - Hip  Flexor Stretch with Strap on Table  - 1-2 x daily - 7 x weekly - 3 reps - 30 sec hold - Clam with Resistance  - 1 x daily - 3-4 x weekly - 2 sets - 10 reps - 3-5 sec hold - Marching with Resistance  - 1 x daily - 3-4 x weekly - 2 sets - 10 reps - 3 sec hold - Standing Hamstring Curl with Resistance  - 1 x daily - 3-4 x weekly - 2 sets - 10 reps - Standing Hip Flexion with Anchored Resistance and Chair Support  - 1 x daily - 3-4 x weekly - 2 sets - 10 reps - 3 sec hold - Standing Hip Adduction with  Anchored Resistance  - 1 x daily - 3-4 x weekly - 2 sets - 10 reps - 3 sec hold - Standing Hip Extension with Anchored Resistance  - 1 x daily - 3-4 x weekly - 2 sets - 10 reps - 3 sec hold - Standing Hip Abduction with Anchored Resistance  - 1 x daily - 3-4 x weekly - 2 sets - 10 reps - 3 sec hold - Semi-Tandem Corner Balance: Eyes Open With Head Turns  - 1 x daily - 3-4 x weekly - 2 sets - 5 reps  Patient Education - What You Can Do to Prevent Falls   ASSESSMENT:  CLINICAL IMPRESSION: Kaiulani demonstrated excellent progress with PT.  She reports no concerns with HEP review/modification last visit and denies need for further review today.  Overall B LE strength now 4+ to 5/5.  FGA has improved from 20/30 on eval to 27/30 today, indicating only a low fall risk for falls.  Improved endurance/distance noted on 6-minute walk test although patient still reporting DOE and noted to stumble/catch foot a few times towards the end of the test due to fatigue but able to self-correct to prevent LOB/fall.  We discussed options for continuing to work on her endurance including walking at the senior center in [facility] or on the exercise track and/or gym at the free fitness center at her church.  All PT goals now met and Mayra feels ready to transition to her HEP but would like to remain on hold for 30-days in the event that issues arise that would necessitate a return to PT.   From EVAL: IRIDIAN READER is a 83 y.o.  female who was referred to physical therapy for evaluation and treatment for fatigue with associated LE weakness.  Patient presents with physical impairments of impaired activity tolerance, impaired standing balance, impaired ambulation, and decreased safety awareness impacting safe and independent functional mobility.  Examination revealed gait speed of 3.67 ft/sec, (2.62 ft/sec is needed for community access) and TUG of 9.28 sec (>13.5 sec indicates increased risk for falls) WNL however increased risk for falls and functional decline as evidenced by the following objective test measures:  5xSTS of 17.03 sec (>15 sec indicates increased risk for falls and decreased BLE power). Further assessment of Berg, FGA and 6 minute walk test to be completed next visit.  Beyonce will benefit from skilled PT to address above deficits to improve mobility and activity tolerance to help reach the maximal level of functional independence and mobility. Patient demonstrates understanding of this POC and is in agreement with this plan.   OBJECTIVE IMPAIRMENTS: Abnormal gait, decreased activity tolerance, decreased balance, decreased mobility, difficulty walking, decreased strength, decreased safety awareness, increased fascial restrictions, impaired perceived functional ability, increased muscle spasms, improper body mechanics, postural dysfunction, and pain.   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   REHAB POTENTIAL: Good  CLINICAL DECISION  MAKING: Evolving/moderate complexity  EVALUATION COMPLEXITY: Moderate   GOALS: Goals reviewed with patient? Yes  SHORT TERM GOALS: Target date: 01/04/2024  Patient will be independent with initial HEP to improve outcomes and carryover.  Baseline: Initial HEP provided on eval 12/10/23 - Pt has  not yet attempted initial HEP 12/16/23 - HEP reviewed and updated Goal status: MET - 12/24/23  2.  Patient will be educated on strategies to decrease risk of falls.  Baseline:  Goal status: MET - 12/21/23  3.  Patient will improve 5xSTS time to </= 15 seconds for improved efficiency and safety with transfers. Baseline: 17.03 sec Goal status: MET - 12/28/23 - 11.00 sec  LONG TERM GOALS: Target date: 02/01/2024  Patient will be independent with advanced/ongoing HEP to facilitate ability to maintain/progress functional gains from skilled physical therapy services. Baseline:  01/19/24 - HEP progressed today Goal status: MET - 01/21/24  2.  Patient will be able to ambulate 600' with or w/o LRAD on variable surfaces including curb and stair negotiation with single rail with good safety to access community.  Baseline:  Goal status: MET - 01/19/24   3. Patient will demonstrate improved B LE strength to >/= 4 to 4+/5 for improved stability and ease of mobility . Baseline: Refer to above LE MMT table Goal status: MET - 01/21/24  4.  Patient will improve Berg score by at least 8 points or to >/= 52/56 to improve safety and stability with ADLs in standing and reduce risk for falls. (MCID= 8 points)  Baseline: 52/26 (12/10/23) Goal status: MET - 12/10/23  5. Patient will improve FGA score by at least 4 points or to at least 21/30 to improve gait stability and reduce risk for falls. Baseline: 20/30 (12/10/23) 12/28/23 - 23/30 Goal status: MET - 01/21/24 - 27/30   PLAN:  PT FREQUENCY: 2x/week  PT DURATION: 8 weeks  PLANNED INTERVENTIONS: 02835- PT Re-evaluation, 97750- Physical Performance Testing,  97110-Therapeutic exercises, 97530- Therapeutic activity, 97112- Neuromuscular re-education, 97535- Self Care, 02859- Manual therapy, 97116- Gait training, [phone]- Electrical stimulation (unattended), 97016- Vasopneumatic device, 97035- Ultrasound, 02966- Ionotophoresis 4mg /ml Dexamethasone , Patient/Family education, Balance training, Stair training, Taping, Dry Needling, Cryotherapy, and Moist heat  PLAN FOR NEXT SESSION:  transition to her HEP + 30-day hold    Elijah CHRISTELLA Hidden, PT 01/21/2024, 11:22 AM   PHYSICAL THERAPY DISCHARGE SUMMARY  Visits from Start of Care: 13  Current functional level related to goals / functional outcomes: Refer to above clinical impression and goal assessment for status as of last visit on 01/21/2024. Patient was placed on hold for 30 days and has not needed to return to PT, therefore will proceed with discharge from PT for this episode.     Remaining deficits: As above.   Education / Equipment: HEP   Patient agrees to discharge. Patient goals were met. Patient is being discharged due to meeting the stated rehab goals.  Elijah EMERSON Hidden, PT 06/14/2024, 10:35 AM  [facility] [address]  [address] [place], [place], [address] Phone: [phone]   Fax:  [phone]     Date of referral: 10/27/23 Referring provider: Daryl Setter, NP Referring diagnosis? R53.83 (ICD-10-CM) - Fatigue, unspecified type  Treatment diagnosis? (if different than referring diagnosis)  Muscle weakness (generalized)  Unsteadiness on feet  History of falling  Difficulty in walking, not elsewhere classified  What was this (referring dx) caused by? Fall, Ongoing Issue, and Unspecified  Nature of Condition: Chronic (continuous duration > 3 months)   Laterality: Both  Current Functional Measure Score: Other ABC scale 1380 / 1600 = 86.3 %   Objective measurements identify impairments when they are compared to  normal values, the uninvolved extremity, and prior level of function.  [x]  Yes  []   No  Objective assessment of functional ability: Moderate functional limitations   Briefly describe symptoms: Aasha reports no concerns with the recent HEP update but did requires minor cues for alignment with hip ADD during review of standing 4-way SLR. Gait assessed on variable surfaces including grass, gravel, inclines, curbs and stairs with no instability or LOB noted - LTG #2 met.  HEP condensed highlighting reduced frequency to 3x/wk for strengthening exercises, splitting exercises into subgroups for completion on alternating days.  Reviewed corner balance setup and self progression for continued balance practice at home.  Dove is progressing well with her PT goals and should hopefully be ready to transition to her HEP with probable 30-day hold allowing for 1x/wk for up to an additional 4 wks pending results of final goal assessment next visit.  How did symptoms start: gradual onset  Average pain intensity:  Last 24 hours: 0/10  Past week: 0/10  How often does the pt experience symptoms? Intermittently  How much have the symptoms interfered with usual daily activities? A little bit  How has condition changed since care began at this facility? Better  In general, how is the patients overall health? Very Good  Onset date: ~1 year   BACK PAIN (STarT Back Screening Tool) - (When applicable): N/A  Has your back pain spread down your leg(s) at sometime in the last 2 weeks? []  Yes   []  No Have you had pain in the shoulder or neck at sometime in the past 2 weeks? []  Yes   []  No Have you only walked short distances because of your back pain? []  Yes   []  No In the past 2 weeks, have you dressed more slowly than usual because of your back pain? []  Yes   []  No Do you think it is not really safe for person with a condition like yours to be physically active? []  Yes   []  No Have worrying thoughts been  going through your mind a lot of the time? []  Yes   []  No Do you feel that your back pain is terrible and it is never going to get any better? []  Yes   []  No In general, have you stopped enjoying all the things you usually enjoy? []  Yes   []  No Overall, how bothersome has your back pain been in the last 2 weeks? []  Not at all   []  Slightly     []  Moderate   []  Very much     []  Extremely

## 2024-01-26 ENCOUNTER — Encounter: Admitting: Physical Therapy

## 2024-02-07 DIAGNOSIS — R0602 Shortness of breath: Secondary | ICD-10-CM | POA: Diagnosis not present

## 2024-02-22 ENCOUNTER — Ambulatory Visit (HOSPITAL_COMMUNITY)
Admission: RE | Admit: 2024-02-22 | Discharge: 2024-02-22 | Disposition: A | Discharge status: Home / Self Care | Source: Ambulatory Visit | Attending: Cardiovascular Disease | Admitting: Cardiovascular Disease

## 2024-02-22 DIAGNOSIS — R0602 Shortness of breath: Secondary | ICD-10-CM | POA: Insufficient documentation

## 2024-02-22 LAB — ECHOCARDIOGRAM COMPLETE: S' Lateral: 2.55 cm

## 2024-11-15 ENCOUNTER — Ambulatory Visit
